# Patient Record
Sex: Female | Born: 1950 | Race: White | Hispanic: No | Smoking: Former smoker
Health system: Southern US, Community
[De-identification: ages and names within clinical notes are randomized; demographics above are authoritative.]

## PROBLEM LIST (undated history)

## (undated) DIAGNOSIS — E119 Type 2 diabetes mellitus without complications: Secondary | ICD-10-CM

## (undated) DIAGNOSIS — T8859XA Other complications of anesthesia, initial encounter: Secondary | ICD-10-CM

## (undated) DIAGNOSIS — R112 Nausea with vomiting, unspecified: Secondary | ICD-10-CM

## (undated) DIAGNOSIS — M8580 Other specified disorders of bone density and structure, unspecified site: Secondary | ICD-10-CM

## (undated) DIAGNOSIS — G43909 Migraine, unspecified, not intractable, without status migrainosus: Secondary | ICD-10-CM

## (undated) DIAGNOSIS — R1319 Other dysphagia: Secondary | ICD-10-CM

## (undated) DIAGNOSIS — I10 Essential (primary) hypertension: Secondary | ICD-10-CM

## (undated) DIAGNOSIS — M1990 Unspecified osteoarthritis, unspecified site: Secondary | ICD-10-CM

## (undated) DIAGNOSIS — I5032 Chronic diastolic (congestive) heart failure: Secondary | ICD-10-CM

## (undated) DIAGNOSIS — F419 Anxiety disorder, unspecified: Secondary | ICD-10-CM

## (undated) DIAGNOSIS — R42 Dizziness and giddiness: Secondary | ICD-10-CM

## (undated) DIAGNOSIS — K219 Gastro-esophageal reflux disease without esophagitis: Secondary | ICD-10-CM

## (undated) DIAGNOSIS — E559 Vitamin D deficiency, unspecified: Secondary | ICD-10-CM

## (undated) DIAGNOSIS — B019 Varicella without complication: Secondary | ICD-10-CM

## (undated) DIAGNOSIS — T4145XA Adverse effect of unspecified anesthetic, initial encounter: Secondary | ICD-10-CM

## (undated) DIAGNOSIS — N189 Chronic kidney disease, unspecified: Secondary | ICD-10-CM

## (undated) DIAGNOSIS — Z8719 Personal history of other diseases of the digestive system: Secondary | ICD-10-CM

## (undated) DIAGNOSIS — Z87442 Personal history of urinary calculi: Secondary | ICD-10-CM

## (undated) DIAGNOSIS — Z9889 Other specified postprocedural states: Secondary | ICD-10-CM

## (undated) DIAGNOSIS — Z148 Genetic carrier of other disease: Secondary | ICD-10-CM

## (undated) DIAGNOSIS — F5104 Psychophysiologic insomnia: Secondary | ICD-10-CM

## (undated) DIAGNOSIS — N200 Calculus of kidney: Secondary | ICD-10-CM

## (undated) DIAGNOSIS — E7800 Pure hypercholesterolemia, unspecified: Secondary | ICD-10-CM

## (undated) DIAGNOSIS — F32A Depression, unspecified: Secondary | ICD-10-CM

## (undated) HISTORY — PX: OTHER SURGICAL HISTORY: SHX169

## (undated) HISTORY — PX: CHOLECYSTECTOMY: SHX55

## (undated) HISTORY — PX: BREAST CYST ASPIRATION: SHX578

## (undated) HISTORY — PX: COLONOSCOPY, ESOPHAGOGASTRODUODENOSCOPY (EGD) AND ESOPHAGEAL DILATION: SHX5781

## (undated) HISTORY — PX: COLONOSCOPY: SHX174

## (undated) HISTORY — PX: HERNIA REPAIR: SHX51

## (undated) HISTORY — PX: LITHOTRIPSY: SUR834

## (undated) HISTORY — PX: ESOPHAGOGASTRODUODENOSCOPY: SHX1529

---

## 2004-03-02 ENCOUNTER — Ambulatory Visit: Payer: Self-pay | Admitting: Internal Medicine

## 2005-04-29 ENCOUNTER — Ambulatory Visit: Payer: Self-pay | Admitting: Unknown Physician Specialty

## 2005-05-11 ENCOUNTER — Ambulatory Visit: Payer: Self-pay | Admitting: Gastroenterology

## 2006-06-01 ENCOUNTER — Ambulatory Visit: Payer: Self-pay | Admitting: Unknown Physician Specialty

## 2008-04-17 ENCOUNTER — Ambulatory Visit: Payer: Self-pay | Admitting: Unknown Physician Specialty

## 2008-10-13 ENCOUNTER — Emergency Department: Payer: Self-pay | Admitting: Emergency Medicine

## 2009-04-23 ENCOUNTER — Ambulatory Visit: Payer: Self-pay | Admitting: Unknown Physician Specialty

## 2009-04-25 ENCOUNTER — Ambulatory Visit: Payer: Self-pay | Admitting: Unknown Physician Specialty

## 2010-04-30 ENCOUNTER — Ambulatory Visit: Payer: Self-pay | Admitting: Internal Medicine

## 2010-05-05 ENCOUNTER — Ambulatory Visit: Payer: Self-pay | Admitting: Unknown Physician Specialty

## 2010-05-06 ENCOUNTER — Ambulatory Visit: Payer: Self-pay | Admitting: Internal Medicine

## 2010-05-07 ENCOUNTER — Ambulatory Visit: Payer: Self-pay | Admitting: Unknown Physician Specialty

## 2010-05-21 ENCOUNTER — Ambulatory Visit: Payer: Self-pay | Admitting: Surgery

## 2010-06-04 ENCOUNTER — Ambulatory Visit: Payer: Self-pay | Admitting: Internal Medicine

## 2010-07-15 ENCOUNTER — Ambulatory Visit: Payer: Self-pay | Admitting: Unknown Physician Specialty

## 2010-07-17 LAB — PATHOLOGY REPORT

## 2010-08-06 ENCOUNTER — Ambulatory Visit: Payer: Self-pay | Admitting: Internal Medicine

## 2010-09-04 ENCOUNTER — Ambulatory Visit: Payer: Self-pay | Admitting: Internal Medicine

## 2010-10-29 ENCOUNTER — Ambulatory Visit: Payer: Self-pay | Admitting: Internal Medicine

## 2010-11-04 ENCOUNTER — Ambulatory Visit: Payer: Self-pay | Admitting: Internal Medicine

## 2011-03-11 ENCOUNTER — Ambulatory Visit: Payer: Self-pay | Admitting: Unknown Physician Specialty

## 2011-03-12 LAB — PATHOLOGY REPORT

## 2011-09-21 ENCOUNTER — Ambulatory Visit: Payer: Self-pay | Admitting: Obstetrics and Gynecology

## 2011-10-28 ENCOUNTER — Ambulatory Visit: Payer: Self-pay | Admitting: Urology

## 2011-11-15 ENCOUNTER — Ambulatory Visit: Payer: Self-pay | Admitting: Urology

## 2011-12-22 ENCOUNTER — Ambulatory Visit: Payer: Self-pay | Admitting: Rheumatology

## 2012-03-09 ENCOUNTER — Ambulatory Visit: Payer: Self-pay | Admitting: Urology

## 2012-03-09 DIAGNOSIS — N201 Calculus of ureter: Secondary | ICD-10-CM | POA: Insufficient documentation

## 2012-03-09 DIAGNOSIS — N1330 Unspecified hydronephrosis: Secondary | ICD-10-CM | POA: Insufficient documentation

## 2012-03-09 DIAGNOSIS — N200 Calculus of kidney: Secondary | ICD-10-CM | POA: Insufficient documentation

## 2012-03-09 DIAGNOSIS — N3946 Mixed incontinence: Secondary | ICD-10-CM | POA: Insufficient documentation

## 2012-03-09 DIAGNOSIS — R310 Gross hematuria: Secondary | ICD-10-CM | POA: Insufficient documentation

## 2012-03-13 ENCOUNTER — Ambulatory Visit: Payer: Self-pay | Admitting: Urology

## 2012-03-21 ENCOUNTER — Ambulatory Visit: Payer: Self-pay | Admitting: Urology

## 2012-08-03 ENCOUNTER — Ambulatory Visit: Payer: Self-pay | Admitting: Urology

## 2012-11-15 ENCOUNTER — Ambulatory Visit: Payer: Self-pay | Admitting: Urology

## 2012-11-15 DIAGNOSIS — N23 Unspecified renal colic: Secondary | ICD-10-CM | POA: Insufficient documentation

## 2012-11-16 ENCOUNTER — Ambulatory Visit: Payer: Self-pay | Admitting: Urology

## 2012-11-16 LAB — CBC WITH DIFFERENTIAL/PLATELET
Basophil %: 0.4 %
Eosinophil #: 0.8 10*3/uL — ABNORMAL HIGH (ref 0.0–0.7)
Lymphocyte #: 2.4 10*3/uL (ref 1.0–3.6)
Lymphocyte %: 27.5 %
MCH: 31.4 pg (ref 26.0–34.0)
MCHC: 34.9 g/dL (ref 32.0–36.0)
MCV: 90 fL (ref 80–100)
Monocyte %: 7.1 %
Neutrophil %: 56.4 %
Platelet: 266 10*3/uL (ref 150–440)
RBC: 4.46 10*6/uL (ref 3.80–5.20)
WBC: 8.9 10*3/uL (ref 3.6–11.0)

## 2012-11-16 LAB — BASIC METABOLIC PANEL
BUN: 12 mg/dL (ref 7–18)
Calcium, Total: 9.2 mg/dL (ref 8.5–10.1)
Chloride: 107 mmol/L (ref 98–107)
Creatinine: 0.73 mg/dL (ref 0.60–1.30)
EGFR (Non-African Amer.): >60
Glucose: 136 mg/dL — ABNORMAL HIGH (ref 65–99)
Osmolality: 279 (ref 275–301)
Potassium: 3.8 mmol/L (ref 3.5–5.1)

## 2012-11-16 LAB — HEPATIC FUNCTION PANEL A (ARMC)
Bilirubin, Direct: <0.1 mg/dL (ref 0.00–0.20)
SGPT (ALT): 24 U/L (ref 12–78)

## 2012-11-16 LAB — IRON: Iron: 80 ug/dL (ref 50–170)

## 2012-11-21 ENCOUNTER — Ambulatory Visit: Payer: Self-pay | Admitting: Urology

## 2012-12-07 DIAGNOSIS — T190XXA Foreign body in urethra, initial encounter: Secondary | ICD-10-CM | POA: Insufficient documentation

## 2013-01-29 ENCOUNTER — Ambulatory Visit: Payer: Self-pay | Admitting: Unknown Physician Specialty

## 2013-07-05 ENCOUNTER — Ambulatory Visit: Payer: Self-pay | Admitting: Urology

## 2013-07-05 DIAGNOSIS — M549 Dorsalgia, unspecified: Secondary | ICD-10-CM | POA: Insufficient documentation

## 2014-01-22 DIAGNOSIS — E7800 Pure hypercholesterolemia, unspecified: Secondary | ICD-10-CM | POA: Insufficient documentation

## 2014-01-22 DIAGNOSIS — E119 Type 2 diabetes mellitus without complications: Secondary | ICD-10-CM | POA: Insufficient documentation

## 2014-01-22 DIAGNOSIS — I10 Essential (primary) hypertension: Secondary | ICD-10-CM | POA: Insufficient documentation

## 2014-02-19 ENCOUNTER — Ambulatory Visit: Payer: Self-pay | Admitting: Family Medicine

## 2014-02-19 DIAGNOSIS — F419 Anxiety disorder, unspecified: Secondary | ICD-10-CM | POA: Insufficient documentation

## 2014-03-04 ENCOUNTER — Ambulatory Visit: Payer: Self-pay | Admitting: Family Medicine

## 2014-04-24 ENCOUNTER — Ambulatory Visit: Payer: Self-pay | Admitting: Orthopedic Surgery

## 2014-07-23 NOTE — Op Note (Signed)
PATIENT NAME:  Sheryl [name], KISSNER MR#:  [id number] DATE OF BIRTH:  [birth year]  DATE OF PROCEDURE:  03/21/2012  PRINCIPAL DIAGNOSIS: Left ureterolithiasis.   POSTOPERATIVE DIAGNOSIS: Left ureterolithiasis.   PROCEDURE PERFORMED:  Left ureteroscopy with holmium laser lithotripsy, left double-J ureteral stent placement.   SURGEON: Edrick Oh, M.D.   ANESTHESIA: Laryngeal mask airway anesthesia.   INDICATIONS: The patient is a 64 year old white female with a history of nephrolithiasis. She presented recently with left-sided flank pain. A CT scan demonstrated a 9 mm distal left ureteral calculus with hydronephrosis and obstruction. She has had intermittent pain and discomfort since that time. She presents for ureteroscopic stone removal.   DESCRIPTION OF PROCEDURE: After informed consent was obtained, the patient was taken to the Operating Room and placed in the dorsal lithotomy position under laryngeal mask airway anesthesia. The patient was then prepped and draped in the usual standard fashion.   The 22-French rigid cystoscope was introduced into the urethra under direct vision with no urethral abnormalities noted. Upon entering the bladder, the mucosa was inspected in its entirety with no gross mucosal lesions noted. Bilateral ureteral orifices were well visualized with no lesions noted. A guidewire was then advanced into the left ureteral orifice. It was advanced into the upper pole collecting system under fluoroscopic guidance without difficulty. No resistance was met at the level of the stone. The cystoscope was removed with the guidewire left in place. The 6-French rigid ureteroscope was advanced into the urinary bladder. The left ureteral orifice was noted to be somewhat small. A second guidewire was utilized to help navigate the scope into the left ureteral orifice. Some narrowing was encountered. This was gently dilated utilizing the tip of the scope just past the area of narrowing and the area of  dilation was entered. The 9 mm stone was well visualized in this dilated area. Moderate inflammation was present at the site of stone impaction. The holmium laser fiber was then utilized to fragment the stone into multiple smaller stone pieces. These were then basket extracted. These were collected and will be sent to pathology for stone analysis. The scope was then advanced to the level of the renal pelvis with no additional stones or abnormalities within the ureter noted. The ureter was moderately dilated throughout. No other significant abnormalities were appreciated.   Due to the need for dilation of the distal ureter and the inflammation, the decision was made to place a stent. The ureteroscope was removed with the guidewire left in place. The guidewire was back-loaded over the cystoscope. A 6-French x 24 cm double-J ureteral stent was advanced over the guidewire into the upper pole collecting system under fluoroscopic guidance without difficulty. Upon withdrawal of the guidewire, adequate curl was noted within the renal pelvis. Adequate curl was also noted within the urinary bladder.   The bladder was drained. The cystoscope was removed. The patient was returned to the supine position and awakened from laryngeal mask airway anesthesia. She was taken to the recovery room in stable condition. There were no problems or complications. The patient tolerated the procedure well.    ____________________________ Denice Bors. Jacqlyn Larsen, MD bsc:cs D: 03/21/2012 09:35:07 ET T: 03/21/2012 20:30:00 ET JOB#: [id number]  cc: Denice Bors. Jacqlyn Larsen, MD, <Dictator> Denice Bors Trev Boley MD ELECTRONICALLY SIGNED 03/22/2012 22:45

## 2014-07-26 NOTE — Op Note (Signed)
PATIENT NAME:  Sheryl [name], Sheryl [name] MR#:  [id number] DATE OF BIRTH:  April 03, 1951  DATE OF PROCEDURE:  11/21/2012  PRINCIPAL DIAGNOSIS:  Left ureterolithiasis.   POSTOPERATIVE DIAGNOSIS:  Left ureterolithiasis.   PROCEDURE PERFORMED:  Left ureteroscopy with holmium laser lithotripsy, left double-J ureteral stent placement.   SURGEON:  Edrick Oh, M.D.   ANESTHESIA:  General endotracheal.   INDICATIONS: The patient is a 64 year old white female with a history of nephrolithiasis. She underwent ureteroscopic stone removal for several large ureteral calculi in the recent past. She also had a large renal calculus. She underwent ESWL. She had multiple stone fragments remaining within the kidney. She recently developed onset left-sided flank pain. X-rays demonstrated multiple stones in the lower left ureter with resolution of the stones within the kidney consistent with stone passage into the lower ureter. She presents for ureteroscopic stone removal.   DESCRIPTION OF PROCEDURE:  After informed consent was obtained, the patient was taken to the operating room and placed in the dorsal lithotomy position under general endotracheal anesthesia. The patient was then prepped and draped in the usual standard fashion. The 22-French rigid cystoscope was introduced into the urethra under direct vision with no urethral abnormalities noted. Upon entering the bladder, the mucosa was inspected in its entirety with no gross mucosal lesions noted. Bilateral ureteral orifices were well visualized with no lesions noted. A flexible tip Glidewire was introduced into the left ureteral orifice. It was easily advanced into the upper pole collecting system under fluoroscopic guidance without difficulty. The cystoscope was removed. The 6-French rigid ureteroscope was advanced into the bladder. A second guidewire was utilized to help facilitate the passage of the scope into the ureter. Slight resistance was met in the lower ureter; however,  with manipulation, the scope was able to be passed. On initial passage, no stones were identified. The proximal ureter was moderately dilated with no evidence of stone or other significant abnormality. The scope was advanced to the level of the ureteropelvic junction with no stones noted within the renal pelvis or upper pole collecting system. The ureter was reinspected upon withdrawal of the scope in the area of the tight band previously noted. Multiple stone fragments were encountered in a more dilated portion of the ureter just behind the area of narrowing. Approximately 1.5 cm of stone fragments were present. The holmium laser fiber was then utilized to fragment the stones into multiple smaller pieces. The pieces were then basket extracted. The stone pieces were collected and will be sent for stone analysis. Due to the tightened area of the ureter and the inflammation from stone presence, the decision was made to proceed with stent placement. The ureteroscope was removed. The cystoscope was replaced and back loaded over the guidewire. A 6-French x 24 cm double-J ureteral stent was advanced over the guidewire into the upper pole collecting system both under fluoroscopic and visual guidance without difficulty. Adequate curl was noted within the renal pelvis upon withdrawal of the guidewire. Adequate curl was also noted within the urinary bladder. The bladder was drained. The cystoscope was removed. The patient was returned to the supine position and awakened from general endotracheal anesthesia. She was taken to the recovery room in stable condition. There were no problems or complications. The patient tolerated the procedure well.   ____________________________ Denice Bors. Jacqlyn Larsen, MD bsc:ce D: 11/21/2012 09:42:16 ET T: 11/21/2012 13:38:43 ET JOB#: [id number]  cc: Denice Bors. Jacqlyn Larsen, MD, <Dictator> Denice Bors Faun Mcqueen MD ELECTRONICALLY SIGNED 11/21/2012 21:31

## 2014-08-20 ENCOUNTER — Other Ambulatory Visit: Payer: Self-pay | Admitting: Family Medicine

## 2014-08-20 DIAGNOSIS — N6320 Unspecified lump in the left breast, unspecified quadrant: Secondary | ICD-10-CM

## 2014-08-20 DIAGNOSIS — F5104 Psychophysiologic insomnia: Secondary | ICD-10-CM | POA: Insufficient documentation

## 2014-09-04 ENCOUNTER — Ambulatory Visit
Admission: RE | Admit: 2014-09-04 | Discharge: 2014-09-04 | Disposition: A | Discharge status: Home / Self Care | Payer: BC Managed Care – PPO | Source: Ambulatory Visit | Attending: Family Medicine | Admitting: Family Medicine

## 2014-09-04 DIAGNOSIS — N63 Unspecified lump in breast: Secondary | ICD-10-CM | POA: Diagnosis not present

## 2014-09-04 DIAGNOSIS — N6320 Unspecified lump in the left breast, unspecified quadrant: Secondary | ICD-10-CM

## 2015-01-30 ENCOUNTER — Encounter: Payer: Self-pay | Admitting: *Deleted

## 2015-01-31 ENCOUNTER — Encounter
Admission: RE | Disposition: A | Discharge status: Home / Self Care | Payer: Self-pay | Source: Ambulatory Visit | Attending: Unknown Physician Specialty

## 2015-01-31 ENCOUNTER — Ambulatory Visit: Payer: BC Managed Care – PPO | Admitting: Anesthesiology

## 2015-01-31 ENCOUNTER — Ambulatory Visit
Admission: RE | Admit: 2015-01-31 | Discharge: 2015-01-31 | Disposition: A | Discharge status: Home / Self Care | Payer: BC Managed Care – PPO | Source: Ambulatory Visit | Attending: Unknown Physician Specialty | Admitting: Unknown Physician Specialty

## 2015-01-31 ENCOUNTER — Encounter: Payer: Self-pay | Admitting: *Deleted

## 2015-01-31 DIAGNOSIS — E1122 Type 2 diabetes mellitus with diabetic chronic kidney disease: Secondary | ICD-10-CM | POA: Diagnosis not present

## 2015-01-31 DIAGNOSIS — E7800 Pure hypercholesterolemia, unspecified: Secondary | ICD-10-CM | POA: Diagnosis not present

## 2015-01-31 DIAGNOSIS — I129 Hypertensive chronic kidney disease with stage 1 through stage 4 chronic kidney disease, or unspecified chronic kidney disease: Secondary | ICD-10-CM | POA: Diagnosis not present

## 2015-01-31 DIAGNOSIS — Z7984 Long term (current) use of oral hypoglycemic drugs: Secondary | ICD-10-CM | POA: Insufficient documentation

## 2015-01-31 DIAGNOSIS — Z79899 Other long term (current) drug therapy: Secondary | ICD-10-CM | POA: Diagnosis not present

## 2015-01-31 DIAGNOSIS — R1310 Dysphagia, unspecified: Secondary | ICD-10-CM | POA: Diagnosis present

## 2015-01-31 DIAGNOSIS — K449 Diaphragmatic hernia without obstruction or gangrene: Secondary | ICD-10-CM | POA: Diagnosis not present

## 2015-01-31 DIAGNOSIS — N189 Chronic kidney disease, unspecified: Secondary | ICD-10-CM | POA: Diagnosis not present

## 2015-01-31 DIAGNOSIS — K2950 Unspecified chronic gastritis without bleeding: Secondary | ICD-10-CM | POA: Insufficient documentation

## 2015-01-31 DIAGNOSIS — Z87891 Personal history of nicotine dependence: Secondary | ICD-10-CM | POA: Insufficient documentation

## 2015-01-31 DIAGNOSIS — K222 Esophageal obstruction: Secondary | ICD-10-CM | POA: Insufficient documentation

## 2015-01-31 DIAGNOSIS — E039 Hypothyroidism, unspecified: Secondary | ICD-10-CM | POA: Insufficient documentation

## 2015-01-31 HISTORY — DX: Type 2 diabetes mellitus without complications: E11.9

## 2015-01-31 HISTORY — DX: Chronic kidney disease, unspecified: N18.9

## 2015-01-31 HISTORY — DX: Varicella without complication: B01.9

## 2015-01-31 HISTORY — PX: SAVORY DILATION: SHX5439

## 2015-01-31 HISTORY — DX: Pure hypercholesterolemia, unspecified: E78.00

## 2015-01-31 HISTORY — DX: Essential (primary) hypertension: I10

## 2015-01-31 HISTORY — DX: Migraine, unspecified, not intractable, without status migrainosus: G43.909

## 2015-01-31 HISTORY — PX: ESOPHAGOGASTRODUODENOSCOPY (EGD) WITH PROPOFOL: SHX5813

## 2015-01-31 HISTORY — DX: Calculus of kidney: N20.0

## 2015-01-31 LAB — GLUCOSE, CAPILLARY: Glucose-Capillary: 103 mg/dL — ABNORMAL HIGH (ref 65–99)

## 2015-01-31 SURGERY — ESOPHAGOGASTRODUODENOSCOPY (EGD) WITH PROPOFOL
Anesthesia: Monitor Anesthesia Care

## 2015-01-31 MED ORDER — MIDAZOLAM HCL 5 MG/5ML IJ SOLN
INTRAMUSCULAR | Status: DC | PRN
  Administered 2015-01-31: 1 mg via INTRAVENOUS

## 2015-01-31 MED ORDER — LIDOCAINE HCL (CARDIAC) 20 MG/ML IV SOLN
INTRAVENOUS | Status: DC | PRN
  Administered 2015-01-31: 30 mg via INTRAVENOUS

## 2015-01-31 MED ORDER — PROPOFOL 500 MG/50ML IV EMUL
INTRAVENOUS | Status: DC | PRN
  Administered 2015-01-31: 160 ug/kg/min via INTRAVENOUS

## 2015-01-31 MED ORDER — SODIUM CHLORIDE 0.9 % IV SOLN: INTRAVENOUS | Status: DC

## 2015-01-31 MED ORDER — PROPOFOL 10 MG/ML IV BOLUS
INTRAVENOUS | Status: DC | PRN
  Administered 2015-01-31: 50 mg via INTRAVENOUS

## 2015-01-31 MED ORDER — SODIUM CHLORIDE 0.9 % IV SOLN
INTRAVENOUS | Status: DC
  Administered 2015-01-31 (×2): via INTRAVENOUS

## 2015-01-31 MED ORDER — FENTANYL CITRATE (PF) 100 MCG/2ML IJ SOLN
INTRAMUSCULAR | Status: DC | PRN
  Administered 2015-01-31: 50 ug via INTRAVENOUS

## 2015-01-31 NOTE — Anesthesia Postprocedure Evaluation (Signed)
  Anesthesia Post-op Note  Patient: Sheryl CowmanDonna F Rueter  Procedure(s) Performed: Procedure(s): ESOPHAGOGASTRODUODENOSCOPY (EGD) WITH PROPOFOL (N/A) SAVORY DILATION (N/A)  Anesthesia type:MAC  Patient location: PACU  Post pain: Pain level controlled  Post assessment: Post-op Vital signs reviewed, Patient's Cardiovascular Status Stable, Respiratory Function Stable, Patent Airway and No signs of Nausea or vomiting  Post vital signs: Reviewed and stable  Last Vitals:  Filed Vitals:   01/31/15 1030  BP: 129/74  Pulse: 55  Temp:   Resp: 14    Level of consciousness: awake, alert  and patient cooperative  Complications: No apparent anesthesia complications

## 2015-01-31 NOTE — Transfer of Care (Signed)
Immediate Anesthesia Transfer of Care Note  Patient: Sheryl CowmanDonna F Tovey  Procedure(s) Performed: Procedure(s): ESOPHAGOGASTRODUODENOSCOPY (EGD) WITH PROPOFOL (N/A) SAVORY DILATION (N/A)  Patient Location: PACU and Short Stay  Anesthesia Type:General  Level of Consciousness: awake and patient cooperative  Airway & Oxygen Therapy: Patient Spontanous Breathing and Patient connected to nasal cannula oxygen  Post-op Assessment: Report given to RN and Post -op Vital signs reviewed and stable  Post vital signs: Reviewed and stable  Last Vitals:  Filed Vitals:   01/31/15 0917  BP: 139/65  Pulse: 60  Temp: 36.6 C  Resp: 18    Complications: No apparent anesthesia complications

## 2015-01-31 NOTE — Op Note (Signed)
[facility] Patient Name: Sheryl [name] Procedure Date: 01/31/2015 9:40 AM MRN: [id number] Account #: [id number] Date of Birth: [birth year] Admit Type: Outpatient Age: 64 Room: [facility] ENDO ROOM 1 Gender: Female Note Status: Finalized Procedure:         Upper GI endoscopy Indications:       Dysphagia Providers:         Scot Jun, MD Referring MD:      Haynes Kerns (Referring MD) Medicines:         Propofol per Anesthesia Complications:     No immediate complications. Procedure:         Pre-Anesthesia Assessment:                    - After reviewing the risks and benefits, the patient was                     deemed in satisfactory condition to undergo the procedure.                    After obtaining informed consent, the endoscope was passed                     under direct vision. Throughout the procedure, the                     patient's blood pressure, pulse, and oxygen saturations                     were monitored continuously. The Endoscope was introduced                     through the mouth, and advanced to the second part of                     duodenum. The upper GI endoscopy was accomplished without                     difficulty. The patient tolerated the procedure well. Findings:      A moderate Schatzki ring (acquired) was found at the gastroesophageal       junction. At the end of the procedure A guidewire was placed and the       scope was withdrawn. Dilation was performed with a Savary dilator with       mild resistance at 17 mm.      A medium-sized hiatus hernia was present.      Diffuse and patchy mild inflammation characterized by erythema and       granularity was found in the gastric antrum. Biopsies were taken with a       cold forceps for Helicobacter pylori testing. Biopsies were taken with a       cold forceps for histology.      Diffuse granular mucosa was found in the second part of the duodenum.   Biopsies were taken with a cold forceps for histology. Biopsies for       histology were taken with a cold forceps for for evaluation of celiac       disease. Impression:        - Moderate Schatzki ring. Dilated.                    - Medium-sized hiatus hernia.                    -  Gastritis. Biopsied.                    - Granular mucosa at 2nd part of the duodenum. Biopsied. Recommendation:    - Await pathology results. Scot Junobert T Brissia Delisa, MD 01/31/2015 10:00:04 AM This report has been signed electronically. Number of Addenda: 0 Note Initiated On: 01/31/2015 9:40 AM Total Procedure Duration: 0 hours 5 minutes 32 seconds       [facility]

## 2015-01-31 NOTE — Anesthesia Preprocedure Evaluation (Signed)
Anesthesia Evaluation  Patient identified by MRN, date of birth, ID band Patient awake    Airway Mallampati: II  TM Distance: >3 FB     Dental no notable dental hx.    Pulmonary neg pulmonary ROS, former smoker,    Pulmonary exam normal        Cardiovascular hypertension,  Rhythm:Regular Rate:Normal     Neuro/Psych  Headaches, negative neurological ROS     GI/Hepatic   Endo/Other  diabetesHypothyroidism   Renal/GU Renal disease     Musculoskeletal   Abdominal Normal abdominal exam  (+)   Peds  Hematology   Anesthesia Other Findings   Reproductive/Obstetrics                             Anesthesia Physical Anesthesia Plan  ASA: II  Anesthesia Plan: MAC   Post-op Pain Management:    Induction:   Airway Management Planned: Nasal Cannula  Additional Equipment:   Intra-op Plan:   Post-operative Plan:   Informed Consent: I have reviewed the patients History and Physical, chart, labs and discussed the procedure including the risks, benefits and alternatives for the proposed anesthesia with the patient or authorized representative who has indicated his/her understanding and acceptance.     Plan Discussed with: CRNA  Anesthesia Plan Comments:         Anesthesia Quick Evaluation

## 2015-01-31 NOTE — H&P (Signed)
   Primary Care Physician:  Sula RumpleVirk, Charanjit, MD Primary Gastroenterologist:  Dr. Mechele CollinElliott  Pre-Procedure History & Physical: HPI:  Sheryl [name] is a 64 y.o. female is here for an endoscopy.   Past Medical History  Diagnosis Date  . Hypothyroidism   . Chronic kidney disease   . Hypercholesterolemia   . Hypertension   . Nephrolithiasis   . Migraines   . Diabetes mellitus without complication (HCC)   . Chicken pox     Past Surgical History  Procedure Laterality Date  . Breast cyst aspiration Left     negative 2012  . Cholecystectomy    . Colonoscopy    . Esophogastroduodeoscopy    . Esophogastroduodenoscopy    . Colonoscopy, esophagogastroduodenoscopy (egd) and esophageal dilation      Prior to Admission medications   Medication Sig Start Date End Date Taking? Authorizing Provider  citalopram (CELEXA) 20 MG tablet Take 20 mg by mouth daily.   Yes Historical Provider, MD  fluticasone (FLONASE) 50 MCG/ACT nasal spray Place 2 sprays into both nostrils daily.   Yes Historical Provider, MD  lisinopril (PRINIVIL,ZESTRIL) 5 MG tablet Take 5 mg by mouth daily.   Yes Historical Provider, MD  lovastatin (MEVACOR) 20 MG tablet Take 20 mg by mouth at bedtime.   Yes Historical Provider, MD  metFORMIN (GLUCOPHAGE) 500 MG tablet Take by mouth 2 (two) times daily with a meal.   Yes Historical Provider, MD  ranitidine (ZANTAC) 150 MG tablet Take 150 mg by mouth 2 (two) times daily.   Yes Historical Provider, MD  zolpidem (AMBIEN) 10 MG tablet Take 10 mg by mouth at bedtime as needed for sleep.   Yes Historical Provider, MD    Allergies as of 01/21/2015  . (Not on File)    History reviewed. No pertinent family history.  Social History   Social History  . Marital Status: Married    Spouse Name: N/A  . Number of Children: N/A  . Years of Education: N/A   Occupational History  . Not on file.   Social History Main Topics  . Smoking status: Former Games developermoker  . Smokeless tobacco: Never  Used  . Alcohol Use: No  . Drug Use: No  . Sexual Activity: Not on file   Other Topics Concern  . Not on file   Social History Narrative    Review of Systems: See HPI, otherwise negative ROS  Physical Exam: BP 139/65 mmHg  Pulse 60  Temp(Src) 97.8 F (36.6 C) (Tympanic)  Resp 18  Ht 5\' 4"  (1.626 m)  Wt 75.297 kg (166 lb)  BMI 28.48 kg/m2  SpO2 99% General:   Alert,  pleasant and cooperative in NAD Head:  Normocephalic and atraumatic. Neck:  Supple; no masses or thyromegaly. Lungs:  Clear throughout to auscultation.    Heart:  Regular rate and rhythm. Abdomen:  Soft, nontender and nondistended. Normal bowel sounds, without guarding, and without rebound.   Neurologic:  Alert and  oriented x4;  grossly normal neurologically.  Impression/Plan: Sheryl [name] is here for an endoscopy to be performed for dysphagia and previous stricture  Risks, benefits, limitations, and alternatives regarding  endoscopy have been reviewed with the patient.  Questions have been answered.  All parties agreeable.   Lynnae PrudeELLIOTT, Naydeen Speirs, MD  01/31/2015, 9:43 AM

## 2015-02-02 ENCOUNTER — Encounter: Payer: Self-pay | Admitting: Unknown Physician Specialty

## 2015-02-03 LAB — SURGICAL PATHOLOGY

## 2016-08-31 DIAGNOSIS — R809 Proteinuria, unspecified: Secondary | ICD-10-CM | POA: Insufficient documentation

## 2016-10-04 ENCOUNTER — Other Ambulatory Visit: Payer: Self-pay | Admitting: Orthopedic Surgery

## 2016-10-04 DIAGNOSIS — M75101 Unspecified rotator cuff tear or rupture of right shoulder, not specified as traumatic: Secondary | ICD-10-CM

## 2016-10-04 DIAGNOSIS — M7531 Calcific tendinitis of right shoulder: Secondary | ICD-10-CM

## 2016-10-13 ENCOUNTER — Ambulatory Visit
Admission: RE | Admit: 2016-10-13 | Discharge: 2016-10-13 | Disposition: A | Discharge status: Home / Self Care | Payer: BC Managed Care – PPO | Source: Ambulatory Visit | Attending: Orthopedic Surgery | Admitting: Orthopedic Surgery

## 2016-10-13 DIAGNOSIS — M7531 Calcific tendinitis of right shoulder: Secondary | ICD-10-CM

## 2016-10-13 DIAGNOSIS — M75101 Unspecified rotator cuff tear or rupture of right shoulder, not specified as traumatic: Secondary | ICD-10-CM

## 2016-10-13 DIAGNOSIS — M75111 Incomplete rotator cuff tear or rupture of right shoulder, not specified as traumatic: Secondary | ICD-10-CM | POA: Diagnosis not present

## 2017-01-12 ENCOUNTER — Encounter: Payer: Self-pay | Admitting: *Deleted

## 2017-01-19 ENCOUNTER — Ambulatory Visit: Payer: BC Managed Care – PPO | Admitting: Student in an Organized Health Care Education/Training Program

## 2017-01-19 ENCOUNTER — Encounter
Admission: RE | Disposition: A | Discharge status: Home / Self Care | Payer: Self-pay | Source: Ambulatory Visit | Attending: Surgery

## 2017-01-19 ENCOUNTER — Ambulatory Visit
Admission: RE | Admit: 2017-01-19 | Discharge: 2017-01-19 | Disposition: A | Discharge status: Home / Self Care | Payer: BC Managed Care – PPO | Source: Ambulatory Visit | Attending: Surgery | Admitting: Surgery

## 2017-01-19 DIAGNOSIS — K219 Gastro-esophageal reflux disease without esophagitis: Secondary | ICD-10-CM | POA: Diagnosis not present

## 2017-01-19 DIAGNOSIS — Z833 Family history of diabetes mellitus: Secondary | ICD-10-CM | POA: Insufficient documentation

## 2017-01-19 DIAGNOSIS — K449 Diaphragmatic hernia without obstruction or gangrene: Secondary | ICD-10-CM | POA: Diagnosis not present

## 2017-01-19 DIAGNOSIS — I10 Essential (primary) hypertension: Secondary | ICD-10-CM | POA: Insufficient documentation

## 2017-01-19 DIAGNOSIS — M67813 Other specified disorders of tendon, right shoulder: Secondary | ICD-10-CM | POA: Insufficient documentation

## 2017-01-19 DIAGNOSIS — Z79899 Other long term (current) drug therapy: Secondary | ICD-10-CM | POA: Diagnosis not present

## 2017-01-19 DIAGNOSIS — Z820 Family history of epilepsy and other diseases of the nervous system: Secondary | ICD-10-CM | POA: Insufficient documentation

## 2017-01-19 DIAGNOSIS — G43909 Migraine, unspecified, not intractable, without status migrainosus: Secondary | ICD-10-CM | POA: Insufficient documentation

## 2017-01-19 DIAGNOSIS — E119 Type 2 diabetes mellitus without complications: Secondary | ICD-10-CM | POA: Insufficient documentation

## 2017-01-19 DIAGNOSIS — Z87891 Personal history of nicotine dependence: Secondary | ICD-10-CM | POA: Insufficient documentation

## 2017-01-19 DIAGNOSIS — M7541 Impingement syndrome of right shoulder: Secondary | ICD-10-CM | POA: Diagnosis present

## 2017-01-19 DIAGNOSIS — Z7984 Long term (current) use of oral hypoglycemic drugs: Secondary | ICD-10-CM | POA: Diagnosis not present

## 2017-01-19 DIAGNOSIS — Z87442 Personal history of urinary calculi: Secondary | ICD-10-CM | POA: Insufficient documentation

## 2017-01-19 DIAGNOSIS — Z8249 Family history of ischemic heart disease and other diseases of the circulatory system: Secondary | ICD-10-CM | POA: Insufficient documentation

## 2017-01-19 DIAGNOSIS — Z9049 Acquired absence of other specified parts of digestive tract: Secondary | ICD-10-CM | POA: Diagnosis not present

## 2017-01-19 DIAGNOSIS — E7800 Pure hypercholesterolemia, unspecified: Secondary | ICD-10-CM | POA: Diagnosis not present

## 2017-01-19 DIAGNOSIS — M75111 Incomplete rotator cuff tear or rupture of right shoulder, not specified as traumatic: Secondary | ICD-10-CM | POA: Diagnosis not present

## 2017-01-19 HISTORY — PX: DISTAL BICEPS TENDON REPAIR: SHX1461

## 2017-01-19 HISTORY — DX: Gastro-esophageal reflux disease without esophagitis: K21.9

## 2017-01-19 HISTORY — DX: Unspecified osteoarthritis, unspecified site: M19.90

## 2017-01-19 HISTORY — DX: Personal history of other diseases of the digestive system: Z87.19

## 2017-01-19 HISTORY — PX: SHOULDER ARTHROSCOPY: SHX128

## 2017-01-19 LAB — GLUCOSE, CAPILLARY
GLUCOSE-CAPILLARY: 118 mg/dL — AB (ref 65–99)
GLUCOSE-CAPILLARY: 161 mg/dL — AB (ref 65–99)

## 2017-01-19 SURGERY — ARTHROSCOPY, SHOULDER
Anesthesia: General | Laterality: Right | Wound class: Clean

## 2017-01-19 MED ORDER — LIDOCAINE HCL (CARDIAC) 20 MG/ML IV SOLN
INTRAVENOUS | Status: DC | PRN
  Administered 2017-01-19: 50 mg via INTRATRACHEAL

## 2017-01-19 MED ORDER — PROPOFOL 10 MG/ML IV BOLUS
INTRAVENOUS | Status: DC | PRN
  Administered 2017-01-19: 200 mg via INTRAVENOUS

## 2017-01-19 MED ORDER — ROPIVACAINE HCL 5 MG/ML IJ SOLN
INTRAMUSCULAR | Status: DC | PRN
  Administered 2017-01-19: 30 mL via EPIDURAL

## 2017-01-19 MED ORDER — FENTANYL CITRATE (PF) 100 MCG/2ML IJ SOLN: 25.0000 ug | INTRAMUSCULAR | Status: DC | PRN

## 2017-01-19 MED ORDER — LACTATED RINGERS IV SOLN
10.0000 mL/h | INTRAVENOUS | Status: DC
  Administered 2017-01-19: 10 mL/h via INTRAVENOUS

## 2017-01-19 MED ORDER — SODIUM CHLORIDE 0.9 % IV SOLN
2000.0000 mg | Freq: Once | INTRAVENOUS | Status: AC
  Administered 2017-01-19: 2000 mg via INTRAVENOUS

## 2017-01-19 MED ORDER — OXYCODONE HCL 5 MG PO TABS: 5.0000 mg | ORAL_TABLET | ORAL | 0 refills | Status: DC | PRN

## 2017-01-19 MED ORDER — POTASSIUM CHLORIDE IN NACL 20-0.9 MEQ/L-% IV SOLN: INTRAVENOUS | Status: DC

## 2017-01-19 MED ORDER — MEPERIDINE HCL 25 MG/ML IJ SOLN: 6.2500 mg | INTRAMUSCULAR | Status: DC | PRN

## 2017-01-19 MED ORDER — LIDOCAINE-EPINEPHRINE 1 %-1:100000 IJ SOLN
INTRAMUSCULAR | Status: DC | PRN
  Administered 2017-01-19: 15 mL

## 2017-01-19 MED ORDER — OXYCODONE HCL 5 MG PO TABS: 5.0000 mg | ORAL_TABLET | Freq: Once | ORAL | Status: DC | PRN

## 2017-01-19 MED ORDER — METOCLOPRAMIDE HCL 5 MG PO TABS: 5.0000 mg | ORAL_TABLET | Freq: Three times a day (TID) | ORAL | Status: DC | PRN

## 2017-01-19 MED ORDER — PROMETHAZINE HCL 25 MG/ML IJ SOLN: 6.2500 mg | INTRAMUSCULAR | Status: DC | PRN

## 2017-01-19 MED ORDER — FENTANYL CITRATE (PF) 100 MCG/2ML IJ SOLN
INTRAMUSCULAR | Status: DC | PRN
  Administered 2017-01-19: 100 ug via INTRAVENOUS

## 2017-01-19 MED ORDER — ONDANSETRON HCL 4 MG PO TABS: 4.0000 mg | ORAL_TABLET | Freq: Four times a day (QID) | ORAL | Status: DC | PRN

## 2017-01-19 MED ORDER — DEXAMETHASONE SODIUM PHOSPHATE 4 MG/ML IJ SOLN
INTRAMUSCULAR | Status: DC | PRN
  Administered 2017-01-19: 8 mg via INTRAVENOUS

## 2017-01-19 MED ORDER — METOCLOPRAMIDE HCL 5 MG/ML IJ SOLN: 5.0000 mg | Freq: Three times a day (TID) | INTRAMUSCULAR | Status: DC | PRN

## 2017-01-19 MED ORDER — OXYCODONE HCL 5 MG PO TABS: 5.0000 mg | ORAL_TABLET | ORAL | Status: DC | PRN

## 2017-01-19 MED ORDER — ONDANSETRON HCL 4 MG/2ML IJ SOLN: 4.0000 mg | Freq: Four times a day (QID) | INTRAMUSCULAR | Status: DC | PRN

## 2017-01-19 MED ORDER — ONDANSETRON HCL 4 MG/2ML IJ SOLN
INTRAMUSCULAR | Status: DC | PRN
  Administered 2017-01-19: 4 mg via INTRAVENOUS

## 2017-01-19 MED ORDER — OXYCODONE HCL 5 MG/5ML PO SOLN: 5.0000 mg | Freq: Once | ORAL | Status: DC | PRN

## 2017-01-19 MED ORDER — MIDAZOLAM HCL 2 MG/2ML IJ SOLN
INTRAMUSCULAR | Status: DC | PRN
  Administered 2017-01-19: 2 mg via INTRAVENOUS

## 2017-01-19 SURGICAL SUPPLY — 63 items
ANCHOR JUGGERKNOT WTAP NDL 2.9 (Anchor) ×3 IMPLANT
BANDAGE ACE 4X5 VEL STRL LF (GAUZE/BANDAGES/DRESSINGS) IMPLANT
BIT DRILL JUGRKNT W/NDL BIT2.9 (DRILL) ×1 IMPLANT
BLADE FULL RADIUS 3.5 (BLADE) ×3 IMPLANT
BNDG COHESIVE 4X5 TAN STRL (GAUZE/BANDAGES/DRESSINGS) ×3 IMPLANT
BNDG ESMARK 4X12 TAN STRL LF (GAUZE/BANDAGES/DRESSINGS) IMPLANT
BUR ACROMIONIZER 4.0 (BURR) ×3 IMPLANT
CANISTER SUCT 1200ML W/VALVE (MISCELLANEOUS) ×3 IMPLANT
CANNULA SHAVER 8MMX76MM (CANNULA) ×3 IMPLANT
CHLORAPREP W/TINT 26ML (MISCELLANEOUS) ×6 IMPLANT
CLOSURE WOUND 1/2 X4 (GAUZE/BANDAGES/DRESSINGS) ×1
COVER LIGHT HANDLE UNIVERSAL (MISCELLANEOUS) ×6 IMPLANT
COVER MAYO STAND STRL (DRAPES) ×3 IMPLANT
CUFF TOURN SGL QUICK 18 (TOURNIQUET CUFF) IMPLANT
CUFF TOURN SGL QUICK 24 (TOURNIQUET CUFF)
CUFF TRNQT CYL 24X4X40X1 (TOURNIQUET CUFF) IMPLANT
DRAPE FLUOR MINI C-ARM 54X84 (DRAPES) IMPLANT
DRAPE IMP U-DRAPE 54X76 (DRAPES) ×6 IMPLANT
DRAPE U-SHAPE 48X52 POLY STRL (PACKS) IMPLANT
DRILL JUGGERKNOT W/NDL BIT 2.9 (DRILL) ×3
ELECT REM PT RETURN 9FT ADLT (ELECTROSURGICAL)
ELECTRODE REM PT RTRN 9FT ADLT (ELECTROSURGICAL) IMPLANT
GAUZE PETRO XEROFOAM 1X8 (MISCELLANEOUS) ×3 IMPLANT
GAUZE SPONGE 4X4 12PLY STRL (GAUZE/BANDAGES/DRESSINGS) ×3 IMPLANT
GLOVE BIO SURGEON STRL SZ8 (GLOVE) ×6 IMPLANT
GLOVE INDICATOR 8.0 STRL GRN (GLOVE) ×3 IMPLANT
GOWN STRL REUS W/ TWL LRG LVL3 (GOWN DISPOSABLE) ×1 IMPLANT
GOWN STRL REUS W/ TWL XL LVL3 (GOWN DISPOSABLE) ×1 IMPLANT
GOWN STRL REUS W/TWL LRG LVL3 (GOWN DISPOSABLE) ×2
GOWN STRL REUS W/TWL XL LVL3 (GOWN DISPOSABLE) ×2
IV LACTATED RINGER IRRG 3000ML (IV SOLUTION) ×4
IV LR IRRIG 3000ML ARTHROMATIC (IV SOLUTION) ×2 IMPLANT
KIT ROOM TURNOVER OR (KITS) ×3 IMPLANT
MANIFOLD 4PT FOR NEPTUNE1 (MISCELLANEOUS) ×3 IMPLANT
MAT BLUE FLOOR 46X72 FLO (MISCELLANEOUS) ×3 IMPLANT
NEEDLE HYPO 21X1.5 SAFETY (NEEDLE) ×3 IMPLANT
NEEDLE REVERSE CUT 1/2 CRC (NEEDLE) ×3 IMPLANT
NS IRRIG 500ML POUR BTL (IV SOLUTION) IMPLANT
PACK ARTHROSCOPY SHOULDER (MISCELLANEOUS) ×3 IMPLANT
PACK EXTREMITY ARMC (MISCELLANEOUS) IMPLANT
PAD CAST CTTN 4X4 STRL (SOFTGOODS) IMPLANT
PAD GROUND ADULT SPLIT (MISCELLANEOUS) ×3 IMPLANT
PADDING CAST COTTON 4X4 STRL (SOFTGOODS)
SLING ARM LRG DEEP (SOFTGOODS) IMPLANT
SLING ARM M TX990204 (SOFTGOODS) IMPLANT
SLING ARM S TX990203 (SOFTGOODS) IMPLANT
SPLINT CAST 1 STEP 4X30 (MISCELLANEOUS) IMPLANT
STAPLER SKIN PROX 35W (STAPLE) ×3 IMPLANT
STRAP BODY AND KNEE 60X3 (MISCELLANEOUS) ×3 IMPLANT
STRIP CLOSURE SKIN 1/2X4 (GAUZE/BANDAGES/DRESSINGS) ×2 IMPLANT
SUT ETHIBOND 0 MO6 C/R (SUTURE) ×3 IMPLANT
SUT VIC AB 2-0 CT1 27 (SUTURE) ×2
SUT VIC AB 2-0 CT1 TAPERPNT 27 (SUTURE) ×1 IMPLANT
SUT VIC AB 2-0 SH 27 (SUTURE)
SUT VIC AB 2-0 SH 27XBRD (SUTURE) IMPLANT
SUT VIC AB 3-0 SH 27 (SUTURE)
SUT VIC AB 3-0 SH 27X BRD (SUTURE) IMPLANT
SWABSTK COMLB BENZOIN TINCTURE (MISCELLANEOUS) IMPLANT
TAPE MICROFOAM 4IN (TAPE) ×3 IMPLANT
TUBING ARTHRO INFLOW-ONLY STRL (TUBING) ×3 IMPLANT
TUBING CONNECTING 10 (TUBING) ×2 IMPLANT
TUBING CONNECTING 10' (TUBING) ×1
WAND HAND CNTRL MULTIVAC 90 (MISCELLANEOUS) ×3 IMPLANT

## 2017-01-19 NOTE — Op Note (Signed)
01/19/2017  3:54 PM  Patient:   Sheryl [name]  Pre-Op Diagnosis:   Impingement/tendinopathy with possible bursal surface partial thickness rotator cuff tear, right shoulder.  Post-Op Diagnosis: Impingement/tendinopathy with bursal surface partial thicknessrotator cuff tear and biceps tendinopathy, right shoulder.  Procedure: Limited arthroscopic debridement, arthroscopic subacromial decompression, mini-open rotator cuff repair, and mini-open biceps tenodesis, right shoulder.  Anesthesia: General endotracheal with interscalene block placed preoperatively by the anesthesiologist.  Surgeon:   Maryagnes Amos, MD  Assistant:   none  Findings: As above. There was mild fraying of the superior portion of the labrum without frank detachment from the glenoid.The biceps tendon demonstrated moderate inflammatory changes without partial or full-thickness tearing. The articular surface of the rotator cuff demonstrated mild degenerative changes at the superior-most insertional fibers of the subscapularis tendon, but otherwise was in excellent condition. The articular surfaces of both the glenoid and humerus were in satisfactory condition.  Complications: None  Fluids:   400 cc  Estimated blood loss: 10 cc  Tourniquet time: None  Drains: None  Closure: Staples   Brief clinical note: The patient is a 66 year old female with a long history of right shoulder pain. The patient's symptoms have progressed despite medications, activity modification, etc. The patient's history and examination are consistent with impingement/tendinopathy with a possiblerotator cuff tear. These findings were confirmed by MRI scan. The patient presents at this time for definitive management of these shoulder symptoms.  Procedure: The patient underwent placement of an interscalene block by the anesthesiologist in the preoperative holding area before being brought into the operating room and lain in  the supine position. The patient then underwent general endotracheal intubation and anesthesia before being repositioned in the beach chair position using the beach chair positioner. The right shoulder and upper extremity were prepped with ChloraPrep solution before being draped sterilely. Preoperative antibiotics were administered. A timeout was performed to confirm the proper surgical site before the expected portal sites and incision site were injected with 0.5% Sensorcaine with epinephrine. A posterior portal was created and the glenohumeral joint thoroughly inspected with the findings as described above. An anterior portal was created using an outside-in technique. The labrum and rotator cuff were further probed, again confirming the above-noted findings. The areas of labral fraying were debrided back to stable margins using the full-radius resector as were several areas of synovitis.Probing of the biceps tendon demonstrated the area of inflammation. Therefore, given these findings, in conjunction with the fraying of the superior-most insertional fibers of the subscapularis tendon and the superior labrum, it was elected to proceed with a biceps tenodesis. The ArthroCare wand was inserted and used to release the biceps tendon from its labral insertion. It also was used to obtain hemostasis as well as to "anneal" the labrum superiorly and anteriorly. The instruments were removed from the joint after suctioning the excess fluid.  The camera was repositioned through the posterior portal into the subacromial space. A separate lateral portal was created using an outside-in technique. The 3.5 mm full-radius resector was introduced and used to perform a subtotal bursectomy. The ArthroCare wand was then inserted and used to remove the periosteal tissue off the undersurface of the anterior third of the acromion as well as to recess the coracoacromial ligament from its attachment along the anterior and lateral margins  of the acromion. The 4.0 mm acromionizing bur was introduced and used to complete the decompression by removing the undersurface of the anterior third of the acromion. The full radius resector was reintroduced  to remove any residual bony debris before the ArthroCare wand was reintroduced to obtain hemostasis. The instruments were then removed from the subacromial space after suctioning the excess fluid.  An approximately 4-5 cm incision was made over the anterolateral aspect of the shoulder beginning at the anterolateral corner of the acromion and extending distally in line with the bicipital groove. This incision was carried down through the subcutaneous tissues to expose the deltoid fascia. The raphae between the anterior and middle thirds was identified and this plane developed to provide access into the subacromial space. Additional bursal tissues were debrided sharply using Metzenbaum scissors. The rotator cuff was inspected carefully and the partial-thickness bursal surface tear was readily identified. It measured 1 cm in length and involved approximately 20% of the thickness of the tendon. The tear was repaired in a side-to-side fashion using a #0 Ethibond interrupted suture. An apparent watertight closure was obtained.  The bicipital groove was identified by palpation and opened for 1-1.5 cm. The biceps tendon stump was retrieved through this defect. The floor of the bicipital groove was roughened with a curet before a single Biomet 2.9 mm JuggerKnot anchor was inserted. Both sets of sutures were passed through the biceps tendon and tied securely to effect the tenodesis. The bicipital sheath was reapproximated using two #0 Ethibond interrupted sutures, incorporating the biceps tendon to further reinforce the tenodesis.  The wound was copiously irrigated with sterile saline solution before the deltoid raphae was reapproximated using 2-0 Vicryl interrupted sutures. The subcutaneous tissues were closed in  two layers using 2-0 Vicryl interrupted sutures before the skin was closed using staples. The portal sites also were closed using staples. A sterile bulky dressing was applied to the shoulder before the arm was placed into a shoulder immobilizer. The patient was then awakened, extubated, and returned to the recovery room in satisfactory condition after tolerating the procedure well.

## 2017-01-19 NOTE — H&P (Signed)
Paper H&P to be scanned into permanent record. H&P reviewed and patient re-examined. No changes. 

## 2017-01-19 NOTE — Anesthesia Preprocedure Evaluation (Addendum)
Anesthesia Evaluation  Patient identified by MRN, date of birth, ID band Patient awake    Reviewed: Allergy & Precautions, H&P , NPO status , Patient's Chart, lab work & pertinent test results, reviewed documented beta blocker date and time   Airway Mallampati: II  TM Distance: >3 FB Neck ROM: full    Dental no notable dental hx.    Pulmonary neg pulmonary ROS, former smoker,    Pulmonary exam normal breath sounds clear to auscultation       Cardiovascular Exercise Tolerance: Good hypertension, negative cardio ROS   Rhythm:regular Rate:Normal     Neuro/Psych  Headaches, negative psych ROS   GI/Hepatic Neg liver ROS, hiatal hernia, GERD  ,  Endo/Other  negative endocrine ROSdiabetesHypothyroidism   Renal/GU CRFRenal disease  negative genitourinary   Musculoskeletal   Abdominal   Peds  Hematology negative hematology ROS (+)   Anesthesia Other Findings   Reproductive/Obstetrics negative OB ROS                             Anesthesia Physical Anesthesia Plan  ASA: II  Anesthesia Plan: General LMA   Post-op Pain Management: GA combined w/ Regional for post-op pain   Induction:   PONV Risk Score and Plan:   Airway Management Planned:   Additional Equipment:   Intra-op Plan:   Post-operative Plan:   Informed Consent: I have reviewed the patients History and Physical, chart, labs and discussed the procedure including the risks, benefits and alternatives for the proposed anesthesia with the patient or authorized representative who has indicated his/her understanding and acceptance.   Dental Advisory Given  Plan Discussed with: CRNA  Anesthesia Plan Comments:         Anesthesia Quick Evaluation

## 2017-01-19 NOTE — Anesthesia Procedure Notes (Signed)
Procedure Name: LMA Insertion Date/Time: 01/19/2017 2:29 PM Performed by: Andee PolesBUSH, Nandana Krolikowski Pre-anesthesia Checklist: Patient identified, Emergency Drugs available, Suction available, Timeout performed and Patient being monitored Patient Re-evaluated:Patient Re-evaluated prior to induction Oxygen Delivery Method: Circle system utilized Preoxygenation: Pre-oxygenation with 100% oxygen Induction Type: IV induction LMA: LMA inserted LMA Size: 4.0 Number of attempts: 1 Placement Confirmation: positive ETCO2 and breath sounds checked- equal and bilateral Tube secured with: Tape

## 2017-01-19 NOTE — Anesthesia Postprocedure Evaluation (Signed)
Anesthesia Post Note  Patient: Isaias CowmanDonna F Rafferty  Procedure(s) Performed: ARTHROSCOPY SHOULDER DEBRIDEMENT DECOMPRESSION AND  ROTATOR CUFF REPAIR AND BICEPS TENODESIS (Right ) BICEPS TENODESIS (Right )  Patient location during evaluation: PACU Anesthesia Type: General Level of consciousness: awake and alert Pain management: pain level controlled Vital Signs Assessment: post-procedure vital signs reviewed and stable Respiratory status: spontaneous breathing, nonlabored ventilation, respiratory function stable and patient connected to nasal cannula oxygen Cardiovascular status: blood pressure returned to baseline and stable Postop Assessment: no apparent nausea or vomiting Anesthetic complications: no    SCOURAS, NICOLE ELAINE

## 2017-01-19 NOTE — Anesthesia Procedure Notes (Signed)
Anesthesia Regional Block: Interscalene brachial plexus block   Pre-Anesthetic Checklist: ,, timeout performed, Correct Patient, Correct Site, Correct Laterality, Correct Procedure, Correct Position, site marked, Risks and benefits discussed,  Surgical consent,  Pre-op evaluation,  At surgeon's request and post-op pain management  Laterality: Right  Prep: chloraprep       Needles:  Injection technique: Single-shot  Needle Type: Stimulator Needle - 40     Needle Length: 4cm  Needle Gauge: 21     Additional Needles:   Procedures:,,,, ultrasound used (permanent image in chart),,,,  Narrative:  Start time: 01/19/2017 12:50 PM End time: 01/19/2017 12:55 PM Injection made incrementally with aspirations every 5 mL.  Performed by: Personally  Anesthesiologist: Aldine ContesSCOURAS, NICOLE ELAINE  Additional Notes: Pt tolerated the procedure well.

## 2017-01-19 NOTE — Transfer of Care (Signed)
Immediate Anesthesia Transfer of Care Note  Patient: Sheryl [name]  Procedure(s) Performed: ARTHROSCOPY SHOULDER DEBRIDEMENT DECOMPRESSION AND  ROTATOR CUFF REPAIR AND BICEPS TENODESIS (Right ) BICEPS TENODESIS (Right )  Patient Location: PACU  Anesthesia Type: General LMA  Level of Consciousness: awake, alert  and patient cooperative  Airway and Oxygen Therapy: Patient Spontanous Breathing and Patient connected to supplemental oxygen  Post-op Assessment: Post-op Vital signs reviewed, Patient's Cardiovascular Status Stable, Respiratory Function Stable, Patent Airway and No signs of Nausea or vomiting  Post-op Vital Signs: Reviewed and stable  Complications: No apparent anesthesia complications

## 2017-01-19 NOTE — Progress Notes (Signed)
Assisted Dr. Gardiner FantiScouras with right, ultrasound guided, supraclavicular block. Side rails up, monitors on throughout procedure. See vital signs in flow sheet. Tolerated Procedure well.

## 2017-01-19 NOTE — Discharge Instructions (Signed)
General Anesthesia, Adult, Care After °These instructions provide you with information about caring for yourself after your procedure. Your health care provider may also give you more specific instructions. Your treatment has been planned according to current medical practices, but problems sometimes occur. Call your health care provider if you have any problems or questions after your procedure. °What can I expect after the procedure? °After the procedure, it is common to have: °· Vomiting. °· A sore throat. °· Mental slowness. ° °It is common to feel: °· Nauseous. °· Cold or shivery. °· Sleepy. °· Tired. °· Sore or achy, even in parts of your body where you did not have surgery. ° °Follow these instructions at home: °For at least 24 hours after the procedure: °· Do not: °? Participate in activities where you could fall or become injured. °? Drive. °? Use heavy machinery. °? Drink alcohol. °? Take sleeping pills or medicines that cause drowsiness. °? Make important decisions or sign legal documents. °? Take care of children on your own. °· Rest. °Eating and drinking °· If you vomit, drink water, juice, or soup when you can drink without vomiting. °· Drink enough fluid to keep your urine clear or pale yellow. °· Make sure you have little or no nausea before eating solid foods. °· Follow the diet recommended by your health care provider. °General instructions °· Have a responsible adult stay with you until you are awake and alert. °· Return to your normal activities as told by your health care provider. Ask your health care provider what activities are safe for you. °· Take over-the-counter and prescription medicines only as told by your health care provider. °· If you smoke, do not smoke without supervision. °· Keep all follow-up visits as told by your health care provider. This is important. °Contact a health care provider if: °· You continue to have nausea or vomiting at home, and medicines are not helpful. °· You  cannot drink fluids or start eating again. °· You cannot urinate after 8-12 hours. °· You develop a skin rash. °· You have fever. °· You have increasing redness at the site of your procedure. °Get help right away if: °· You have difficulty breathing. °· You have chest pain. °· You have unexpected bleeding. °· You feel that you are having a life-threatening or urgent problem. °This information is not intended to replace advice given to you by your health care provider. Make sure you discuss any questions you have with your health care provider. °Document Released: 06/28/2000 Document Revised: 08/25/2015 Document Reviewed: 03/06/2015 °Elsevier Interactive Patient Education © 2018 [employer] ° ° °Keep dressing dry and intact.  °May shower after dressing changed on post-op day #4 (Sunday).  °Cover staples with Band-Aids after drying off. °Apply ice frequently to shoulder. °Take ibuprofen 600-800 mg TID with meals for 7-10 days, then as necessary. °Take oxycodone as prescribed when needed.  °May supplement with ES Tylenol if necessary. °Keep shoulder immobilizer on at all times except may remove for bathing purposes. °Follow-up in 10-14 days or as scheduled. °

## 2017-01-20 ENCOUNTER — Encounter: Payer: Self-pay | Admitting: Surgery

## 2017-01-21 DIAGNOSIS — M24111 Other articular cartilage disorders, right shoulder: Secondary | ICD-10-CM | POA: Insufficient documentation

## 2017-01-21 DIAGNOSIS — M7521 Bicipital tendinitis, right shoulder: Secondary | ICD-10-CM | POA: Insufficient documentation

## 2017-12-23 ENCOUNTER — Ambulatory Visit: Payer: Self-pay | Admitting: Urology

## 2018-02-08 ENCOUNTER — Ambulatory Visit: Payer: Self-pay | Admitting: Urology

## 2018-03-07 NOTE — Progress Notes (Signed)
03/10/2018  7:32 AM   Isaias Cowman [birth year] [id number]  Referring provider: Raynelle Bring [address] [place], [place] [address]  Chief Complaint  Patient presents with   Nephrolithiasis    HPI: Sheryl [name] is a 67 yo F who presents today to transfer care here from Dr. Achilles Dunk and for the evaluation of possible nephrolithiasis. She is  accompanied with a family member. She reports of pain in the LLQ and left flank pain.  Denies precipitating, aggravating or alleviating factors.  Severity is rated mild to moderate.  Denies nausea, vomiting, fever, chills.  She  denies any urinary symptoms but notes intermittent gross hematuria.  Her pain is similar to previous stone episodes.  Her UA is negative.   PMH: Past Medical History:  Diagnosis Date   Arthritis    lower back, left hip    Chicken pox    Chronic kidney disease    Diabetes mellitus without complication (HCC)    GERD (gastroesophageal reflux disease)    History of hiatal hernia    Hypercholesterolemia    Hypertension    Hypothyroidism    Migraines    rare now   Nephrolithiasis     Surgical History: Past Surgical History:  Procedure Laterality Date   BREAST CYST ASPIRATION Left    negative 2012   CHOLECYSTECTOMY     COLONOSCOPY     COLONOSCOPY, ESOPHAGOGASTRODUODENOSCOPY (EGD) AND ESOPHAGEAL DILATION     DISTAL BICEPS TENDON REPAIR Right 01/19/2017   Procedure: BICEPS TENODESIS;  Surgeon: Christena Flake, MD;  [facility];  Service: Orthopedics;  Laterality: Right;  Diabetic - oral meds   ESOPHAGOGASTRODUODENOSCOPY (EGD) WITH PROPOFOL N/A 01/31/2015   Procedure: ESOPHAGOGASTRODUODENOSCOPY (EGD) WITH PROPOFOL;  Surgeon: Scot Jun, MD;  Location: [facility];  Service: Endoscopy;  Laterality: N/A;   esophogastroduodenoscopy     esophogastroduodeoscopy     SAVORY DILATION N/A 01/31/2015   Procedure: SAVORY DILATION;  Surgeon: Scot Jun, MD;   Location[facility] END[facility];  Service: Endoscopy;  Laterality: N/A;   SHOULDER ARTHROSCOPY Right 01/19/2017   Procedure: ARTHROSCOPY SHOULDER DEBRIDEMENT DECOMPRESSION AND  ROTATOR CUFF REPAIR AND BICEPS TENODESIS;  Surgeon: Christena Flake, MD;  Location: [facility];  Service: Orthopedics;  Laterality: Right;    Home Medications:  Allergies as of 03/08/2018   No Known Allergies     Medication List        Accurate as of 03/08/18 11:59 PM. Always use your most recent med list.          citalopram 20 MG tablet Commonly known as:  CELEXA Take 20 mg by mouth daily.   fluticasone 50 MCG/ACT nasal spray Commonly known as:  FLONASE Place 2 sprays into both nostrils daily.   lisinopril 5 MG tablet Commonly known as:  PRINIVIL,ZESTRIL Take 5 mg by mouth daily.   lovastatin 20 MG tablet Commonly known as:  MEVACOR Take 20 mg by mouth at bedtime.   metFORMIN 500 MG tablet Commonly known as:  GLUCOPHAGE Take by mouth 2 (two) times daily with a meal.   ranitidine 150 MG tablet Commonly known as:  ZANTAC Take 150 mg by mouth 2 (two) times daily.       Allergies: No Known Allergies  Family History: No family history on file.  Social History:  reports that she quit smoking about 9 years ago. She has never used smokeless tobacco. She reports that she does not drink alcohol or use drugs.  ROS:  UROLOGY Frequent Urination?: No Hard to postpone urination?: No Burning/pain with urination?: No Get up at night to urinate?: No Leakage of urine?: No Urine stream starts and stops?: No Trouble starting stream?: No Do you have to strain to urinate?: No Blood in urine?: Yes Urinary tract infection?: No Sexually transmitted disease?: No Injury to kidneys or bladder?: No Painful intercourse?: No Weak stream?: No Currently pregnant?: No Vaginal bleeding?: No Last menstrual period?: Postmenopausal  Gastrointestinal Nausea?: No Vomiting?: No Indigestion/heartburn?:  No Diarrhea?: No Constipation?: No  Constitutional Fever: No Night sweats?: No Weight loss?: No Fatigue?: No  Skin Skin rash/lesions?: No Itching?: No  Eyes Blurred vision?: No Double vision?: No  Ears/Nose/Throat Sore throat?: No Sinus problems?: No  Hematologic/Lymphatic Swollen glands?: No Easy bruising?: No  Cardiovascular Leg swelling?: No Chest pain?: No  Respiratory Cough?: No Shortness of breath?: No  Endocrine Excessive thirst?: No  Musculoskeletal Back pain?: No Joint pain?: No  Neurological Headaches?: No Dizziness?: No  Psychologic Depression?: No Anxiety?: No  Physical Exam: There were no vitals taken for this visit.  Constitutional:  Alert and oriented, No acute distress. HEENT: [place] AT, moist mucus membranes.  Trachea midline, no masses. Cardiovascular: No clubbing, cyanosis, or edema. Respiratory: Normal respiratory effort, no increased work of breathing. GI: Abdomen is soft,no abdominal masses, mild LLQ tenderness GU: Mild left CVA tenderness  Skin: No rashes, bruises or suspicious lesions. Neurologic: Grossly intact, no focal deficits, moving all 4 extremities. Psychiatric: Normal mood and affect.  Laboratory Data:  Urinalysis Dipstick 1+ leukocytes, trace blood Microscopy negative   Assessment & Plan:   1. History of Nephrolithiasis -KUB ordered and call back with report  -If any abnormal calcifications noted will need verification with either a renal ultrasound or CT.  If KUB negative would recommend CT urogram and cystoscopy as part of a hematuria evaluation.  2. Gross Hematuria -See above    Riki AltesScott C Stoioff, MD  [facility] [address], [address] [place], [place] [address] [phone]   I, Donne Hazelethusan Sivanesan, am acting as a scribe for Dr. Lorin PicketScott C. Stoioff,  I, Riki AltesScott C Stoioff, MD, have reviewed all documentation for this visit. The documentation on 12.08/2017 for the exam,  diagnosis, procedures, and orders are all accurate and complete.

## 2018-03-08 ENCOUNTER — Encounter: Payer: Self-pay | Admitting: Urology

## 2018-03-08 ENCOUNTER — Encounter

## 2018-03-08 ENCOUNTER — Ambulatory Visit
Admission: RE | Admit: 2018-03-08 | Discharge: 2018-03-08 | Disposition: A | Discharge status: Home / Self Care | Payer: Medicare Other | Source: Ambulatory Visit | Attending: Urology | Admitting: Urology

## 2018-03-08 ENCOUNTER — Ambulatory Visit (INDEPENDENT_AMBULATORY_CARE_PROVIDER_SITE_OTHER): Payer: Medicare Other | Admitting: Urology

## 2018-03-08 DIAGNOSIS — N200 Calculus of kidney: Secondary | ICD-10-CM

## 2018-03-08 DIAGNOSIS — R109 Unspecified abdominal pain: Secondary | ICD-10-CM

## 2018-03-08 LAB — MICROSCOPIC EXAMINATION

## 2018-03-08 LAB — URINALYSIS, COMPLETE
Bilirubin, UA: NEGATIVE
GLUCOSE, UA: NEGATIVE
KETONES UA: NEGATIVE
NITRITE UA: NEGATIVE
SPEC GRAV UA: 1.025 (ref 1.005–1.030)
UUROB: 1 mg/dL (ref 0.2–1.0)
pH, UA: 6.5 (ref 5.0–7.5)

## 2018-03-10 ENCOUNTER — Encounter: Payer: Self-pay | Admitting: Urology

## 2018-03-16 ENCOUNTER — Ambulatory Visit
Admission: RE | Admit: 2018-03-16 | Discharge: 2018-03-16 | Disposition: A | Discharge status: Home / Self Care | Payer: Medicare Other | Source: Ambulatory Visit | Attending: Urology | Admitting: Urology

## 2018-03-16 DIAGNOSIS — N200 Calculus of kidney: Secondary | ICD-10-CM | POA: Insufficient documentation

## 2018-03-16 DIAGNOSIS — R109 Unspecified abdominal pain: Secondary | ICD-10-CM | POA: Diagnosis present

## 2018-03-24 ENCOUNTER — Telehealth: Payer: Self-pay | Admitting: Family Medicine

## 2018-03-24 NOTE — Telephone Encounter (Signed)
Patient notified and made appointment.

## 2018-03-24 NOTE — Telephone Encounter (Signed)
-----   Message from Riki AltesScott C Stoioff, MD sent at 03/24/2018 12:23 PM EST ----- CT showed a large stone in her left kidney.  Recommend follow-up appointment after the first of the year to discuss treatment options.

## 2018-04-10 ENCOUNTER — Emergency Department
Admission: EM | Admit: 2018-04-10 | Discharge: 2018-04-10 | Disposition: A | Discharge status: Home / Self Care | Payer: Medicare Other | Attending: Student in an Organized Health Care Education/Training Program | Admitting: Student in an Organized Health Care Education/Training Program

## 2018-04-10 ENCOUNTER — Encounter: Payer: Self-pay | Admitting: Emergency Medicine

## 2018-04-10 ENCOUNTER — Emergency Department: Payer: Medicare Other

## 2018-04-10 DIAGNOSIS — I129 Hypertensive chronic kidney disease with stage 1 through stage 4 chronic kidney disease, or unspecified chronic kidney disease: Secondary | ICD-10-CM | POA: Diagnosis not present

## 2018-04-10 DIAGNOSIS — N12 Tubulo-interstitial nephritis, not specified as acute or chronic: Secondary | ICD-10-CM

## 2018-04-10 DIAGNOSIS — Z7984 Long term (current) use of oral hypoglycemic drugs: Secondary | ICD-10-CM | POA: Diagnosis not present

## 2018-04-10 DIAGNOSIS — Z9049 Acquired absence of other specified parts of digestive tract: Secondary | ICD-10-CM | POA: Diagnosis not present

## 2018-04-10 DIAGNOSIS — N10 Acute tubulo-interstitial nephritis: Secondary | ICD-10-CM | POA: Diagnosis not present

## 2018-04-10 DIAGNOSIS — N189 Chronic kidney disease, unspecified: Secondary | ICD-10-CM | POA: Insufficient documentation

## 2018-04-10 DIAGNOSIS — Z87891 Personal history of nicotine dependence: Secondary | ICD-10-CM | POA: Diagnosis not present

## 2018-04-10 DIAGNOSIS — N200 Calculus of kidney: Secondary | ICD-10-CM

## 2018-04-10 DIAGNOSIS — E039 Hypothyroidism, unspecified: Secondary | ICD-10-CM | POA: Insufficient documentation

## 2018-04-10 DIAGNOSIS — R1030 Lower abdominal pain, unspecified: Secondary | ICD-10-CM | POA: Diagnosis present

## 2018-04-10 DIAGNOSIS — E1122 Type 2 diabetes mellitus with diabetic chronic kidney disease: Secondary | ICD-10-CM | POA: Insufficient documentation

## 2018-04-10 DIAGNOSIS — Z79899 Other long term (current) drug therapy: Secondary | ICD-10-CM | POA: Diagnosis not present

## 2018-04-10 DIAGNOSIS — F419 Anxiety disorder, unspecified: Secondary | ICD-10-CM | POA: Diagnosis not present

## 2018-04-10 LAB — URINALYSIS, COMPLETE (UACMP) WITH MICROSCOPIC
BILIRUBIN URINE: NEGATIVE
Glucose, UA: NEGATIVE mg/dL
KETONES UR: NEGATIVE mg/dL
Nitrite: NEGATIVE
Protein, ur: NEGATIVE mg/dL
Specific Gravity, Urine: 1.012 (ref 1.005–1.030)
pH: 6 (ref 5.0–8.0)

## 2018-04-10 LAB — BASIC METABOLIC PANEL
ANION GAP: 8 (ref 5–15)
BUN: 10 mg/dL (ref 8–23)
CALCIUM: 9.1 mg/dL (ref 8.9–10.3)
CO2: 25 mmol/L (ref 22–32)
CREATININE: 1.08 mg/dL — AB (ref 0.44–1.00)
Chloride: 106 mmol/L (ref 98–111)
GFR, EST NON AFRICAN AMERICAN: 54 mL/min — AB (ref >60)
GLUCOSE: 124 mg/dL — AB (ref 70–99)
Potassium: 3.5 mmol/L (ref 3.5–5.1)
Sodium: 139 mmol/L (ref 135–145)

## 2018-04-10 LAB — CBC
HCT: 46.4 % — ABNORMAL HIGH (ref 36.0–46.0)
Hemoglobin: 15.2 g/dL — ABNORMAL HIGH (ref 12.0–15.0)
MCH: 30.4 pg (ref 26.0–34.0)
MCHC: 32.8 g/dL (ref 30.0–36.0)
MCV: 92.8 fL (ref 80.0–100.0)
NRBC: 0 % (ref 0.0–0.2)
PLATELETS: 296 10*3/uL (ref 150–400)
RBC: 5 MIL/uL (ref 3.87–5.11)
RDW: 12.5 % (ref 11.5–15.5)
WBC: 11.8 10*3/uL — ABNORMAL HIGH (ref 4.0–10.5)

## 2018-04-10 MED ORDER — OXYCODONE HCL 5 MG PO TABS
5.0000 mg | ORAL_TABLET | Freq: Once | ORAL | Status: AC
  Administered 2018-04-10: 5 mg via ORAL
  Filled 2018-04-10: qty 1

## 2018-04-10 MED ORDER — OXYCODONE HCL 5 MG PO TABS: 5.0000 mg | ORAL_TABLET | Freq: Three times a day (TID) | ORAL | 0 refills | Status: DC | PRN

## 2018-04-10 MED ORDER — NAPROXEN 500 MG PO TABS
500.0000 mg | ORAL_TABLET | Freq: Once | ORAL | Status: AC
  Administered 2018-04-10: 500 mg via ORAL
  Filled 2018-04-10: qty 1

## 2018-04-10 MED ORDER — LEVOFLOXACIN 750 MG PO TABS: 750.0000 mg | ORAL_TABLET | Freq: Every day | ORAL | 0 refills | Status: AC

## 2018-04-10 MED ORDER — NAPROXEN 500 MG PO TABS: 500.0000 mg | ORAL_TABLET | Freq: Two times a day (BID) | ORAL | 0 refills | Status: DC

## 2018-04-10 NOTE — ED Notes (Signed)
See triage note  Presents with left flank pain which is  Moving into groin area  States she hash a hx of renal stones and was seen by Dr Lonna Cobb about 2-3 weeks ago  Was told that she has stone to both side  States pain has increases with some nausea

## 2018-04-10 NOTE — ED Notes (Signed)
Pt verbalized understanding of d/c instructions, rx, and f/u care. No further questions at this time. Pt ambulatory with steady gait to the lobby with family.

## 2018-04-10 NOTE — ED Triage Notes (Signed)
Patient presents to the ED with intermittent flank pain for the past several months but states it was worse in the past few weeks and extremely bad today.  Patient reports history of kidney stones and states she was seen Dec. 1st by urology and told she had a kidney stone at that time.  Patient states she has had difficulty getting an appointment with urology since that time but this time the pain is worse.  Patient states, "I've never been able to pass my kidney stones before, they've always had to go in and bust them up."  Patient is in no obvious discomfort during triage.

## 2018-04-10 NOTE — Discharge Instructions (Signed)
Please follow up with Dr. Lonna Cobb as scheduled. You may want to call and request an earlier appointment.  Return to the ER for symptoms that change or worsen if unable to schedule an appointment.

## 2018-04-11 NOTE — ED Provider Notes (Signed)
[facility] Emergency Department Provider Note  ____________________________________________   First MD Initiated Contact with Patient 04/10/18 1714     (approximate)  I have reviewed the triage vital signs and the nursing notes.   HISTORY  Chief Complaint Flank Pain   HPI Sheryl [name] is a 68 y.o. female who presents to the emergency department for treatment and evaluation of flank pain. She was evaluated by urology and was told she has 2 large stones in each kidney. She has a follow up with Dr. Lonna CobbStoioff next week, but the left flank pain has become severe. No relief with tylenol or ibuprofen.    Past Medical History:  Diagnosis Date  . Arthritis    lower back, left hip   . Chicken pox   . Chronic kidney disease   . Diabetes mellitus without complication (HCC)   . GERD (gastroesophageal reflux disease)   . History of hiatal hernia   . Hypercholesterolemia   . Hypertension   . Hypothyroidism   . Migraines    rare now  . Nephrolithiasis     Patient Active Problem List   Diagnosis Date Noted  . 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 Biceps tendinitis of right upper extremity 01/21/2017  . Urine test positive for microalbuminuria 08/31/2016  . Chronic insomnia 08/20/2014  . Anxiety 02/19/2014  . Diabetes mellitus type 2, uncomplicated (HCC) 01/22/2014  . Hypercholesterolemia 01/22/2014  . Hypertension 01/22/2014  . Back pain 07/05/2013  . Foreign body in bladder and urethra 12/07/2012  . Renal colic 11/15/2012  . Disorder of calcium metabolism 03/09/2012  . Gross hematuria 03/09/2012  . Hydronephrosis 03/09/2012  . Kidney stone 03/09/2012  . Mixed urge and stress incontinence 03/09/2012  . Ureteric stone 03/09/2012    Past Surgical History:  Procedure Laterality Date  . BREAST CYST ASPIRATION Left    negative 2012  . CHOLECYSTECTOMY    . COLONOSCOPY    . COLONOSCOPY, ESOPHAGOGASTRODUODENOSCOPY (EGD)  AND ESOPHAGEAL DILATION    . DISTAL BICEPS TENDON REPAIR Right 01/19/2017   Procedure: BICEPS TENODESIS;  Surgeon: Christena FlakePoggi, John J, MD;  [facility];  Service: Orthopedics;  Laterality: Right;  Diabetic - oral meds  . ESOPHAGOGASTRODUODENOSCOPY (EGD) WITH PROPOFOL N/A 01/31/2015   Procedure: ESOPHAGOGASTRODUODENOSCOPY (EGD) WITH PROPOFOL;  Surgeon: Scot Junobert T Elliott, MD;  [facility];  Service: Endoscopy;  Laterality: N/A;  . esophogastroduodenoscopy    . esophogastroduodeoscopy    . SAVORY DILATION N/A 01/31/2015   Procedure: SAVORY DILATION;  Surgeon: Scot Junobert T Elliott, MD;  [facility];  Service: Endoscopy;  Laterality: N/A;  . SHOULDER ARTHROSCOPY Right 01/19/2017   Procedure: ARTHROSCOPY SHOULDER DEBRIDEMENT DECOMPRESSION AND  ROTATOR CUFF REPAIR AND BICEPS TENODESIS;  Surgeon: Christena FlakePoggi, John J, MD;  Location: [facility];  Service: Orthopedics;  Laterality: Right;    Prior to Admission medications   Medication Sig Start Date End Date Taking? Authorizing Provider  citalopram (CELEXA) 20 MG tablet Take 20 mg by mouth daily.    [provider]  fluticasone (FLONASE) 50 MCG/ACT nasal spray Place 2 sprays into both nostrils daily.    [provider]  levofloxacin (LEVAQUIN) 750 MG tablet Take 1 tablet (750 mg total) by mouth daily for 14 days. 04/10/18 04/24/18  Bernise Sylvain, Rulon Eisenmengerari B, FNP  lisinopril (PRINIVIL,ZESTRIL) 5 MG tablet Take 5 mg by mouth daily.    [provider]  lovastatin (MEVACOR) 20 MG tablet Take 20 mg by mouth at  bedtime.    [provider]  metFORMIN (GLUCOPHAGE) 500 MG tablet Take by mouth 2 (two) times daily with a meal.    [provider]  naproxen (NAPROSYN) 500 MG tablet Take 1 tablet (500 mg total) by mouth 2 (two) times daily with a meal. 04/10/18   Garret Teale B, FNP  oxyCODONE (ROXICODONE) 5 MG immediate release tablet Take 1 tablet (5 mg total) by mouth every 8 (eight) hours as  needed. 04/10/18 04/10/19  Bri Wakeman, Kasandra Knudsenari B, FNP  ranitidine (ZANTAC) 150 MG tablet Take 150 mg by mouth 2 (two) times daily.    [provider]    Allergies Patient has no known allergies.  No family history on file.  Social History Social History   Tobacco Use  . Smoking status: Former Smoker    Last attempt to quit: 2010    Years since quitting: 10.0  . Smokeless tobacco: Never Used  Substance Use Topics  . Alcohol use: No  . Drug use: No    Review of Systems  Constitutional: No fever/chills Eyes: No visual changes. ENT: No sore throat. Cardiovascular: Denies chest pain. Respiratory: Denies shortness of breath. Gastrointestinal: No abdominal pain.  No nausea, no vomiting.  No diarrhea.  No constipation. Genitourinary: Negative for dysuria. Musculoskeletal: Positive for back pain. Skin: Negative for rash. Neurological: Negative for headaches, focal weakness or numbness. ____________________________________________   PHYSICAL EXAM:  VITAL SIGNS: ED Triage Vitals  Enc Vitals Group     BP 04/10/18 1551 (!) 152/85     Pulse Rate 04/10/18 1551 99     Resp 04/10/18 1551 20     Temp 04/10/18 1551 97.8 F (36.6 C)     Temp Source 04/10/18 1551 Oral     SpO2 04/10/18 1551 95 %     Weight 04/10/18 1552 182 lb (82.6 kg)     Height 04/10/18 1552 5\' 4"  (1.626 m)     Head Circumference --      Peak Flow --      Pain Score 04/10/18 1551 8     Pain Loc --      Pain Edu? --      Excl. in GC? --     Constitutional: Alert and oriented. Well appearing and in no acute distress. Eyes: Conjunctivae are normal. Head: Atraumatic. Nose: No congestion/rhinnorhea. Mouth/Throat: Mucous membranes are moist.  Oropharynx non-erythematous. Neck: No stridor.   Cardiovascular: Normal rate, regular rhythm. Grossly normal heart sounds.  Good peripheral circulation. Respiratory: Normal respiratory effort.  No retractions. Lungs CTAB. Gastrointestinal: Soft and nontender. No  distention. No abdominal bruits. CVA tenderness on the left. Musculoskeletal: No lower extremity tenderness nor edema. Neurologic:  Normal speech and language. No gross focal neurologic deficits are appreciated. No gait instability. Skin:  Skin is warm, dry and intact. No rash noted. Psychiatric: Mood and affect are normal. Speech and behavior are normal.  ____________________________________________   LABS (all labs ordered are listed, but only abnormal results are displayed)  Labs Reviewed  URINALYSIS, COMPLETE (UACMP) WITH MICROSCOPIC - Abnormal; Notable for the following components:      Result Value   Color, Urine YELLOW (*)    APPearance HAZY (*)    Hgb urine dipstick MODERATE (*)    Leukocytes, UA LARGE (*)    Bacteria, UA RARE (*)    All other components within normal limits  BASIC METABOLIC PANEL - Abnormal; Notable for the following components:   Glucose, Bld 124 (*)    Creatinine, Ser  1.08 (*)    GFR calc non Af Amer 54 (*)    All other components within normal limits  CBC - Abnormal; Notable for the following components:   WBC 11.8 (*)    Hemoglobin 15.2 (*)    HCT 46.4 (*)    All other components within normal limits   ____________________________________________  EKG  Not indicated. ____________________________________________  RADIOLOGY  ED MD interpretation:  4mm calculus in the interpolar right kidney without hydronephrosis.48mm calculus neat the left lower pole with mild hydronephrosis.  Official radiology report(s): US Renal  Result Date: 04/10/2018 CLINICAL DATA:  Flank pain EXAM: RENAL / URINARY TRACT ULTRASOUND COMPLETE COMPARISON:  CT abdomen pelvis 03/16/2018 FINDINGS: Right Kidney: Renal measurements: 12.1 x 4.5 x 4.6 cm = volume: 130 mL. There is a 9 mm calculus in the interpolar right kidney. No hydronephrosis. There is a cyst near the lower pole measuring 3.2 x 3.6 x 3.2 cm. Left Kidney: Renal measurements: 11.7 x 6.3 x 6.1 cm = volume: 237 mL.  There is a shadowing calculus measuring 22 mm near the left lower pole. There is mild hydronephrosis. Bladder: Appears normal for degree of bladder distention. Ureteral jets were not visualized. IMPRESSION: Bilateral nephrolithiasis with mild left hydronephrosis. Electronically Signed   By: Deatra Robinson M.D.   On: 04/10/2018 18:25    ____________________________________________   PROCEDURES  Procedure(s) performed: None  Procedures  Critical Care performed: No  ____________________________________________   INITIAL IMPRESSION / ASSESSMENT AND PLAN / ED COURSE  As part of my medical decision making, I reviewed the following data within the electronic MEDICAL RECORD NUMBER Notes from prior ED visits   68 year old female presenting with renal colic. US shows large stones in each kidney with mild hydronephrosis on the left. Her pain is fairly well controlled at this time and she is afebrile. Based on the UA and flank pain, she will be treated for pyelonephrosis. She is to call the urologist and request an earlier appointment or at least keep the one scheduled for next week. She was advised to return to the ER if pain is not controllable with medications or if she is unable to urinate. She and her husband were agreeable to the plan.      ____________________________________________   FINAL CLINICAL IMPRESSION(S) / ED DIAGNOSES  Final diagnoses:  Nephrolithiasis  Pyelonephritis     ED Discharge Orders         Ordered    levofloxacin (LEVAQUIN) 750 MG tablet  Daily     04/10/18 1858    oxyCODONE (ROXICODONE) 5 MG immediate release tablet  Every 8 hours PRN     04/10/18 1858    naproxen (NAPROSYN) 500 MG tablet  2 times daily with meals     04/10/18 1858           Note:  This document was prepared using Dragon voice recognition software and may include unintentional dictation errors.    Chinita Pester, FNP 04/11/18 1554    Willy Eddy, MD 04/14/18 [phone]

## 2018-04-18 ENCOUNTER — Ambulatory Visit: Payer: Medicare Other | Admitting: Urology

## 2018-04-18 ENCOUNTER — Encounter: Payer: Self-pay | Admitting: Urology

## 2018-04-18 VITALS — BP 126/69 | HR 73 | Ht 64.0 in

## 2018-04-18 DIAGNOSIS — N200 Calculus of kidney: Secondary | ICD-10-CM

## 2018-04-20 ENCOUNTER — Telehealth: Payer: Self-pay | Admitting: Urology

## 2018-04-20 ENCOUNTER — Telehealth: Payer: Self-pay

## 2018-04-20 NOTE — Telephone Encounter (Signed)
Returned call to patient to find out what medication she wanted a refill on. She is requesting refill on oxycodone that was given to her in the ER.

## 2018-04-20 NOTE — Telephone Encounter (Signed)
Pt left vm and wants a refill, but didn't leave any message about which medication. Can you please call pt back at [phone] Thanks, Mindi Junker

## 2018-04-20 NOTE — Progress Notes (Signed)
04/18/2018 8:42 AM   Sheryl [name] Oct 16, 1950 [id number]  Referring provider: Marina Goodell, MD [address] [place], [place] [address]  Chief Complaint  Patient presents with  . Nephrolithiasis  . Hospitalization Follow-up    HPI: 68 year old female presents for follow-up of nephrolithiasis.  I initially saw her on 03/08/2018 for left flank and left lower quadrant abdominal pain with a history of stone disease.  A KUB showed a large calculus in the left kidney and CT was recommended which showed a left lower pole partial staghorn calculus extending into the renal pelvis.  She was seen in the ED on 04/10/2018 for increasing left flank pain.  She was treated for UTI however a urine culture was not performed.  Urinalysis did show pyuria.  She presents today for discussion of stone treatment options.   PMH: Past Medical History:  Diagnosis Date  . Arthritis    lower back, left hip   . Chicken pox   . Chronic kidney disease   . Diabetes mellitus without complication (HCC)   . GERD (gastroesophageal reflux disease)   . History of hiatal hernia   . Hypercholesterolemia   . Hypertension   . Hypothyroidism   . Migraines    rare now  . Nephrolithiasis     Surgical History: Past Surgical History:  Procedure Laterality Date  . BREAST CYST ASPIRATION Left    negative 2012  . CHOLECYSTECTOMY    . COLONOSCOPY    . COLONOSCOPY, ESOPHAGOGASTRODUODENOSCOPY (EGD) AND ESOPHAGEAL DILATION    . DISTAL BICEPS TENDON REPAIR Right 01/19/2017   Procedure: BICEPS TENODESIS;  Surgeon: Christena Flake, MD;  Location: [facility];  Service: Orthopedics;  Laterality: Right;  Diabetic - oral meds  . ESOPHAGOGASTRODUODENOSCOPY (EGD) WITH PROPOFOL N/A 01/31/2015   Procedure: ESOPHAGOGASTRODUODENOSCOPY (EGD) WITH PROPOFOL;  Surgeon: Scot Jun, MD;  Location: [facility];  Service: Endoscopy;  Laterality: N/A;  . esophogastroduodenoscopy    . esophogastroduodeoscopy    .  SAVORY DILATION N/A 01/31/2015   Procedure: SAVORY DILATION;  Surgeon: Scot Jun, MD;  Location: [facility];  Service: Endoscopy;  Laterality: N/A;  . SHOULDER ARTHROSCOPY Right 01/19/2017   Procedure: ARTHROSCOPY SHOULDER DEBRIDEMENT DECOMPRESSION AND  ROTATOR CUFF REPAIR AND BICEPS TENODESIS;  Surgeon: Christena Flake, MD;  Location: [facility];  Service: Orthopedics;  Laterality: Right;    Home Medications:  Allergies as of 04/18/2018   No Known Allergies     Medication List       Accurate as of April 18, 2018 11:59 PM. Always use your most recent med list.        citalopram 20 MG tablet Commonly known as:  CELEXA Take 20 mg by mouth daily.   fluticasone 50 MCG/ACT nasal spray Commonly known as:  FLONASE Place 2 sprays into both nostrils daily.   levofloxacin 750 MG tablet Commonly known as:  LEVAQUIN Take 1 tablet (750 mg total) by mouth daily for 14 days.   lisinopril 5 MG tablet Commonly known as:  PRINIVIL,ZESTRIL Take 5 mg by mouth daily.   lovastatin 20 MG tablet Commonly known as:  MEVACOR Take 20 mg by mouth at bedtime.   metFORMIN 500 MG tablet Commonly known as:  GLUCOPHAGE Take by mouth 2 (two) times daily with a meal.   naproxen 500 MG tablet Commonly known as:  NAPROSYN Take 1 tablet (500 mg total) by mouth 2 (two) times daily with a meal.   oxyCODONE 5 MG immediate release  tablet Commonly known as:  ROXICODONE Take 1 tablet (5 mg total) by mouth every 8 (eight) hours as needed.   ranitidine 150 MG tablet Commonly known as:  ZANTAC Take 150 mg by mouth 2 (two) times daily.       Allergies: No Known Allergies  Family History: No family history on file.  Social History:  reports that she quit smoking about 10 years ago. She has never used smokeless tobacco. She reports that she does not drink alcohol or use drugs.  ROS: UROLOGY Frequent Urination?: No Hard to postpone urination?: Yes Burning/pain with urination?:  No Get up at night to urinate?: No Leakage of urine?: Yes Urine stream starts and stops?: No Trouble starting stream?: No Do you have to strain to urinate?: No Blood in urine?: No Urinary tract infection?: No Sexually transmitted disease?: No Injury to kidneys or bladder?: No Painful intercourse?: No Weak stream?: No Currently pregnant?: No Vaginal bleeding?: No Last menstrual period?: Postmenopausal  Gastrointestinal Nausea?: No Vomiting?: No Indigestion/heartburn?: No Diarrhea?: No Constipation?: No  Constitutional Fever: No Night sweats?: No Weight loss?: No Fatigue?: No  Skin Skin rash/lesions?: No Itching?: No  Eyes Blurred vision?: No Double vision?: No  Ears/Nose/Throat Sore throat?: No Sinus problems?: No  Hematologic/Lymphatic Swollen glands?: No Easy bruising?: No  Cardiovascular Leg swelling?: No Chest pain?: No  Respiratory Cough?: No Shortness of breath?: No  Endocrine Excessive thirst?: No  Musculoskeletal Back pain?: No Joint pain?: No  Neurological Headaches?: No Dizziness?: No  Psychologic Depression?: No Anxiety?: No  Physical Exam: BP 126/69 (BP Location: Left Arm, Patient Position: Sitting, Cuff Size: Large)   Pulse 73   Ht 5\' 4"  (1.626 m)   BMI 31.24 kg/m   Constitutional:  Alert and oriented, No acute distress. HEENT: [place] AT, moist mucus membranes.  Trachea midline, no masses. Cardiovascular: No clubbing, cyanosis, or edema.  RRR Respiratory: Normal respiratory effort, no increased work of breathing.  Clear GI: Abdomen is soft, nontender, nondistended, no abdominal masses GU: No CVA tenderness Lymph: No cervical or inguinal lymphadenopathy. Skin: No rashes, bruises or suspicious lesions. Neurologic: Grossly intact, no focal deficits, moving all 4 extremities. Psychiatric: Normal mood and affect.    Pertinent Imaging: Images were personally reviewed Results for orders placed during the hospital encounter of  03/08/18  Abdomen 1 view (KUB)   Narrative CLINICAL DATA:  Acute left flank pain, gross hematuria.  EXAM: ABDOMEN - 1 VIEW  COMPARISON:  Radiographs of July 05, 2013.  FINDINGS: The bowel gas pattern is normal. Status post cholecystectomy. Left nephrolithiasis is noted with largest calculus measuring 17 mm.  IMPRESSION: Left nephrolithiasis with 17 mm calculus seen in expected position of left renal pelvis. No evidence of bowel obstruction or ileus.   Electronically Signed   By: Lupita RaiderJames  Green Jr, M.D.   On: 03/09/2018 09:59     Results for orders placed during the hospital encounter of 04/10/18  US Renal   Narrative CLINICAL DATA:  Flank pain  EXAM: RENAL / URINARY TRACT ULTRASOUND COMPLETE  COMPARISON:  CT abdomen pelvis 03/16/2018  FINDINGS: Right Kidney:  Renal measurements: 12.1 x 4.5 x 4.6 cm = volume: 130 mL. There is a 9 mm calculus in the interpolar right kidney. No hydronephrosis. There is a cyst near the lower pole measuring 3.2 x 3.6 x 3.2 cm.  Left Kidney:  Renal measurements: 11.7 x 6.3 x 6.1 cm = volume: 237 mL. There is a shadowing calculus measuring 22 mm near the left lower  pole. There is mild hydronephrosis.  Bladder:  Appears normal for degree of bladder distention. Ureteral jets were not visualized.  IMPRESSION: Bilateral nephrolithiasis with mild left hydronephrosis.   Electronically Signed   By: Deatra Robinson M.D.   On: 04/10/2018 18:25     Results for orders placed during the hospital encounter of 03/16/18  CT RENAL STONE STUDY   Narrative CLINICAL DATA:  Left-sided flank pain for 3 weeks.  Nephrolithiasis.  EXAM: CT ABDOMEN AND PELVIS WITHOUT CONTRAST  TECHNIQUE: Multidetector CT imaging of the abdomen and pelvis was performed following the standard protocol without IV contrast.  COMPARISON:  None.  FINDINGS: Lower chest: No acute findings.  Hepatobiliary: No mass visualized on this unenhanced exam. Mild diffuse  hepatic steatosis. Prior cholecystectomy. No evidence of biliary obstruction.  Pancreas: No mass or inflammatory process visualized on this unenhanced exam.  Spleen:  Within normal limits in size.  Adrenals/Urinary tract: Small fluid attenuation right renal cyst is seen. Renal calculi are seen bilaterally. Partial staghorn calculus is seen in the left renal pelvis and lower pole collecting system, with mild left hydronephrosis. No evidence of ureteral calculi or dilatation. Unremarkable unopacified urinary bladder.  Stomach/Bowel: Small hiatal hernia. No evidence of obstruction, inflammatory process, or abnormal fluid collections. Normal appendix visualized. Diverticulosis is seen mainly involving the descending and sigmoid colon, however there is no evidence of diverticulitis.  Vascular/Lymphatic: No pathologically enlarged lymph nodes identified. No evidence of abdominal aortic aneurysm. Aortic atherosclerosis.  Reproductive: Normal appearance of uterus. 1.7 cm benign-appearing right ovarian cyst noted.  Other:  None.  Musculoskeletal:  No suspicious bone lesions identified.  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  Bilateral nephrolithiasis.  Small hiatal hernia.  Hepatic steatosis.  Colonic diverticulosis, without radiographic evidence of diverticulitis.  Small benign appearing right ovarian cyst.   Electronically Signed   By: Myles Rosenthal M.D.   On: 03/16/2018 10:34     Assessment & Plan:   68 year old female with a lower pole partial staghorn calculus.  She was primarily interested in ESWL however based on stone volume I did not recommend.  PCNL and staged ureteroscopy were both discussed.  She was informed that PCNL would have the highest success rate of having the stone treated and one procedure but is more invasive and has more potential complications including bleeding/hemorrhage and renal  injury.  She would like to think over these options but is leaning towards PCNL.   Riki Altes, MD  [facility] [address] [place], [place] [address] [phone]

## 2018-04-21 MED ORDER — OXYCODONE HCL 5 MG PO TABS: 5.0000 mg | ORAL_TABLET | Freq: Three times a day (TID) | ORAL | 0 refills | Status: DC | PRN

## 2018-04-21 NOTE — Telephone Encounter (Signed)
Refill was sent

## 2018-04-23 ENCOUNTER — Encounter: Payer: Self-pay | Admitting: Urology

## 2018-05-02 ENCOUNTER — Other Ambulatory Visit: Payer: Self-pay | Admitting: Radiology

## 2018-05-02 DIAGNOSIS — N200 Calculus of kidney: Secondary | ICD-10-CM

## 2018-05-04 ENCOUNTER — Other Ambulatory Visit: Payer: Self-pay | Admitting: Radiology

## 2018-05-10 ENCOUNTER — Other Ambulatory Visit: Payer: Self-pay | Admitting: Radiology

## 2018-05-10 DIAGNOSIS — N200 Calculus of kidney: Secondary | ICD-10-CM

## 2018-05-11 ENCOUNTER — Other Ambulatory Visit: Payer: Self-pay

## 2018-05-11 ENCOUNTER — Encounter
Admission: RE | Admit: 2018-05-11 | Discharge: 2018-05-11 | Disposition: A | Discharge status: Home / Self Care | Payer: Medicare Other | Source: Ambulatory Visit | Attending: Urology | Admitting: Urology

## 2018-05-11 DIAGNOSIS — Z01818 Encounter for other preprocedural examination: Secondary | ICD-10-CM | POA: Insufficient documentation

## 2018-05-11 DIAGNOSIS — Z01810 Encounter for preprocedural cardiovascular examination: Secondary | ICD-10-CM | POA: Diagnosis not present

## 2018-05-11 HISTORY — DX: Anxiety disorder, unspecified: F41.9

## 2018-05-11 HISTORY — DX: Personal history of urinary calculi: Z87.442

## 2018-05-11 LAB — CBC
HCT: 46.8 % — ABNORMAL HIGH (ref 36.0–46.0)
Hemoglobin: 15.4 g/dL — ABNORMAL HIGH (ref 12.0–15.0)
MCH: 30.4 pg (ref 26.0–34.0)
MCHC: 32.9 g/dL (ref 30.0–36.0)
MCV: 92.3 fL (ref 80.0–100.0)
Platelets: 270 10*3/uL (ref 150–400)
RBC: 5.07 MIL/uL (ref 3.87–5.11)
RDW: 12.6 % (ref 11.5–15.5)
WBC: 7.2 10*3/uL (ref 4.0–10.5)
nRBC: 0 % (ref 0.0–0.2)

## 2018-05-11 LAB — BASIC METABOLIC PANEL
Anion gap: 7 (ref 5–15)
BUN: 17 mg/dL (ref 8–23)
CO2: 29 mmol/L (ref 22–32)
Calcium: 9.4 mg/dL (ref 8.9–10.3)
Chloride: 104 mmol/L (ref 98–111)
Creatinine, Ser: 0.61 mg/dL (ref 0.44–1.00)
GFR calc Af Amer: >60 mL/min (ref >60)
GFR calc non Af Amer: >60 mL/min (ref >60)
GLUCOSE: 103 mg/dL — AB (ref 70–99)
Potassium: 3.9 mmol/L (ref 3.5–5.1)
Sodium: 140 mmol/L (ref 135–145)

## 2018-05-11 LAB — TYPE AND SCREEN
ABO/RH(D): A NEG
Antibody Screen: NEGATIVE

## 2018-05-11 LAB — PROTIME-INR
INR: 0.95
Prothrombin Time: 12.6 seconds (ref 11.4–15.2)

## 2018-05-11 NOTE — Patient Instructions (Signed)
Your procedure is scheduled on: May 22, 2018 MONDAY Report to Day Surgery on [facility]nd [facility] To find out your arrival time, please call [phone] between 1PM - 3PM on: May 19, 2018 Friday   REMEMBER: Instructions that are not followed completely may result in serious medical risk, up to and including death; or upon the discretion of your surgeon and anesthesiologist your surgery may need to be rescheduled.  Do not eat food after midnight the night before surgery.  No gum chewing, lozengers or hard candies.  You may however, drink CLEAR liquids up to 2 hours before you are scheduled to arrive for your surgery. Do not drink anything within 2 hours of the start of your surgery.  Clear liquids include: - water   Do NOT drink anything that is not on this list.  Type 1 and Type 2 diabetics should only drink water.  No Alcohol for 24 hours before or after surgery.  No Smoking including e-cigarettes for 24 hours prior to surgery.  No chewable tobacco products for at least 6 hours prior to surgery.  No nicotine patches on the day of surgery.  On the morning of surgery brush your teeth with toothpaste and water, you may rinse your mouth with mouthwash if you wish. Do not swallow any toothpaste or mouthwash.  Notify your doctor if there is any change in your medical condition (cold, fever, infection).  Do not wear jewelry, make-up, hairpins, clips or nail polish.  Do not wear lotions, powders, or perfumes OR DEODORANT  Do not shave 48 hours prior to surgery.   Contacts and dentures may not be worn into surgery.  Do not bring valuables to the hospital, including drivers license, insurance or credit cards.  [facility] is not responsible for any belongings or valuables.   TAKE THESE MEDICATIONS THE MORNING OF SURGERY: CELEXA  DO NOT TAKE LISINOPRIL THE DAY OF SURGERY  Use CHG Soap or wipes as directed on instruction sheet.   Stop Metformin  2 days  prior to surgery. LAST DOSE ON Friday May 19, 2018.  Follow recommendations from Cardiologist, Pulmonologist or PCP regarding stopping Aspirin, Coumadin, Plavix, Eliquis, Pradaxa, or Pletal.  Stop Anti-inflammatories (NSAIDS) such as Advil, Aleve, Ibuprofen, Motrin, Naproxen, Naprosyn and Aspirin based products such as Excedrin, Goodys Powder, BC Powder. (May take Tylenol or Acetaminophen if needed.)  Stop ANY OVER THE COUNTER supplements until after surgery. (May continue Vitamin D, Vitamin B, and multivitamin.)  Wear comfortable clothing (specific to your surgery type) to the hospital.  Plan for stool softeners for home use.  If you are being admitted to the hospital overnight, leave your suitcase in the car. After surgery it may be brought to your room.  If you are being discharged the day of surgery, you will not be allowed to drive home. You will need a responsible adult to drive you home and stay with you that night.   If you are taking public transportation, you will need to have a responsible adult with you. Please confirm with your physician that it is acceptable to use public transportation.   Please call [phone] if you have any questions about these instructions.

## 2018-05-16 ENCOUNTER — Other Ambulatory Visit: Payer: Self-pay | Admitting: Radiology

## 2018-05-16 ENCOUNTER — Other Ambulatory Visit: Payer: Medicare Other

## 2018-05-16 DIAGNOSIS — N200 Calculus of kidney: Secondary | ICD-10-CM

## 2018-05-16 LAB — URINALYSIS, COMPLETE
Bilirubin, UA: NEGATIVE
Glucose, UA: NEGATIVE
Ketones, UA: NEGATIVE
Leukocytes, UA: NEGATIVE
Nitrite, UA: NEGATIVE
Protein, UA: NEGATIVE
Specific Gravity, UA: 1.02 (ref 1.005–1.030)
Urobilinogen, Ur: 0.2 mg/dL (ref 0.2–1.0)
pH, UA: 5.5 (ref 5.0–7.5)

## 2018-05-16 LAB — MICROSCOPIC EXAMINATION: WBC, UA: NONE SEEN /hpf (ref 0–5)

## 2018-05-18 LAB — CULTURE, URINE COMPREHENSIVE

## 2018-05-19 ENCOUNTER — Other Ambulatory Visit: Payer: Self-pay | Admitting: Student

## 2018-05-19 ENCOUNTER — Other Ambulatory Visit: Payer: Self-pay | Admitting: Physician Assistant

## 2018-05-21 MED ORDER — CEFAZOLIN SODIUM-DEXTROSE 2-4 GM/100ML-% IV SOLN: 2.0000 g | INTRAVENOUS | Status: DC

## 2018-05-22 ENCOUNTER — Encounter: Payer: Self-pay | Admitting: *Deleted

## 2018-05-22 ENCOUNTER — Other Ambulatory Visit: Payer: Self-pay

## 2018-05-22 ENCOUNTER — Ambulatory Visit: Payer: Medicare Other | Admitting: Anesthesiology

## 2018-05-22 ENCOUNTER — Ambulatory Visit: Payer: Medicare Other

## 2018-05-22 ENCOUNTER — Encounter
Admission: RE | Disposition: A | Discharge status: Home / Self Care | Payer: Self-pay | Source: Home / Self Care | Attending: Urology

## 2018-05-22 ENCOUNTER — Observation Stay
Admission: RE | Admit: 2018-05-22 | Discharge: 2018-05-23 | Disposition: A | Discharge status: Home / Self Care | Payer: Medicare Other | Attending: Urology | Admitting: Urology

## 2018-05-22 ENCOUNTER — Ambulatory Visit
Admission: RE | Admit: 2018-05-22 | Discharge: 2018-05-22 | Disposition: A | Discharge status: Home / Self Care | Payer: Medicare Other | Source: Ambulatory Visit | Attending: Urology | Admitting: Urology

## 2018-05-22 DIAGNOSIS — Z7984 Long term (current) use of oral hypoglycemic drugs: Secondary | ICD-10-CM | POA: Diagnosis not present

## 2018-05-22 DIAGNOSIS — K219 Gastro-esophageal reflux disease without esophagitis: Secondary | ICD-10-CM | POA: Diagnosis not present

## 2018-05-22 DIAGNOSIS — Q6211 Congenital occlusion of ureteropelvic junction: Secondary | ICD-10-CM | POA: Diagnosis not present

## 2018-05-22 DIAGNOSIS — F419 Anxiety disorder, unspecified: Secondary | ICD-10-CM | POA: Diagnosis not present

## 2018-05-22 DIAGNOSIS — N132 Hydronephrosis with renal and ureteral calculous obstruction: Principal | ICD-10-CM | POA: Insufficient documentation

## 2018-05-22 DIAGNOSIS — Z87891 Personal history of nicotine dependence: Secondary | ICD-10-CM | POA: Diagnosis not present

## 2018-05-22 DIAGNOSIS — E7800 Pure hypercholesterolemia, unspecified: Secondary | ICD-10-CM | POA: Diagnosis not present

## 2018-05-22 DIAGNOSIS — Z791 Long term (current) use of non-steroidal anti-inflammatories (NSAID): Secondary | ICD-10-CM | POA: Diagnosis not present

## 2018-05-22 DIAGNOSIS — Z79899 Other long term (current) drug therapy: Secondary | ICD-10-CM | POA: Diagnosis not present

## 2018-05-22 DIAGNOSIS — Z87442 Personal history of urinary calculi: Secondary | ICD-10-CM | POA: Diagnosis not present

## 2018-05-22 DIAGNOSIS — N200 Calculus of kidney: Secondary | ICD-10-CM

## 2018-05-22 DIAGNOSIS — I129 Hypertensive chronic kidney disease with stage 1 through stage 4 chronic kidney disease, or unspecified chronic kidney disease: Secondary | ICD-10-CM | POA: Diagnosis not present

## 2018-05-22 DIAGNOSIS — E1122 Type 2 diabetes mellitus with diabetic chronic kidney disease: Secondary | ICD-10-CM | POA: Diagnosis not present

## 2018-05-22 DIAGNOSIS — N189 Chronic kidney disease, unspecified: Secondary | ICD-10-CM | POA: Insufficient documentation

## 2018-05-22 DIAGNOSIS — Z436 Encounter for attention to other artificial openings of urinary tract: Secondary | ICD-10-CM | POA: Insufficient documentation

## 2018-05-22 DIAGNOSIS — Z419 Encounter for procedure for purposes other than remedying health state, unspecified: Secondary | ICD-10-CM

## 2018-05-22 HISTORY — PX: NEPHROLITHOTOMY: SHX5134

## 2018-05-22 HISTORY — PX: IR NEPHROSTOMY PLACEMENT LEFT: IMG6063

## 2018-05-22 LAB — GLUCOSE, CAPILLARY
Glucose-Capillary: 119 mg/dL — ABNORMAL HIGH (ref 70–99)
Glucose-Capillary: 153 mg/dL — ABNORMAL HIGH (ref 70–99)
Glucose-Capillary: 163 mg/dL — ABNORMAL HIGH (ref 70–99)

## 2018-05-22 LAB — CBC
HCT: 45.5 % (ref 36.0–46.0)
Hemoglobin: 15.2 g/dL — ABNORMAL HIGH (ref 12.0–15.0)
MCH: 30.5 pg (ref 26.0–34.0)
MCHC: 33.4 g/dL (ref 30.0–36.0)
MCV: 91.4 fL (ref 80.0–100.0)
PLATELETS: 286 10*3/uL (ref 150–400)
RBC: 4.98 MIL/uL (ref 3.87–5.11)
RDW: 12.6 % (ref 11.5–15.5)
WBC: 5.7 10*3/uL (ref 4.0–10.5)
nRBC: 0 % (ref 0.0–0.2)

## 2018-05-22 LAB — APTT: aPTT: 29 seconds (ref 24–36)

## 2018-05-22 LAB — PROTIME-INR
INR: 0.98
PROTHROMBIN TIME: 12.9 s (ref 11.4–15.2)

## 2018-05-22 LAB — ABO/RH: ABO/RH(D): A NEG

## 2018-05-22 SURGERY — NEPHROLITHOTOMY PERCUTANEOUS
Anesthesia: General | Site: Back | Laterality: Left

## 2018-05-22 MED ORDER — CEFAZOLIN SODIUM-DEXTROSE 1-4 GM/50ML-% IV SOLN
1.0000 g | Freq: Three times a day (TID) | INTRAVENOUS | Status: AC
  Administered 2018-05-22 – 2018-05-23 (×2): 1 g via INTRAVENOUS
  Filled 2018-05-22 (×3): qty 50

## 2018-05-22 MED ORDER — PHENYLEPHRINE HCL 10 MG/ML IJ SOLN
INTRAMUSCULAR | Status: DC | PRN
  Administered 2018-05-22 (×6): 100 ug via INTRAVENOUS

## 2018-05-22 MED ORDER — GLYCOPYRROLATE 0.2 MG/ML IJ SOLN
INTRAMUSCULAR | Status: DC | PRN
  Administered 2018-05-22: 0.2 mg via INTRAVENOUS

## 2018-05-22 MED ORDER — PNEUMOCOCCAL VAC POLYVALENT 25 MCG/0.5ML IJ INJ: 0.5000 mL | INJECTION | INTRAMUSCULAR | Status: DC

## 2018-05-22 MED ORDER — SODIUM CHLORIDE 0.9 % IV SOLN
INTRAVENOUS | Status: DC
  Administered 2018-05-22 (×2): via INTRAVENOUS

## 2018-05-22 MED ORDER — FENTANYL CITRATE (PF) 100 MCG/2ML IJ SOLN
INTRAMUSCULAR | Status: AC
  Filled 2018-05-22: qty 2

## 2018-05-22 MED ORDER — IOPAMIDOL (ISOVUE-300) INJECTION 61%
100.0000 mL | Freq: Once | INTRAVENOUS | Status: AC | PRN
  Administered 2018-05-22: 30 mL

## 2018-05-22 MED ORDER — IOPAMIDOL (ISOVUE-200) INJECTION 41%
INTRAVENOUS | Status: DC | PRN
  Administered 2018-05-22: 60 mL

## 2018-05-22 MED ORDER — BELLADONNA ALKALOIDS-OPIUM 16.2-60 MG RE SUPP: 1.0000 | Freq: Four times a day (QID) | RECTAL | Status: DC | PRN

## 2018-05-22 MED ORDER — PROMETHAZINE HCL 25 MG/ML IJ SOLN
6.2500 mg | INTRAMUSCULAR | Status: DC | PRN
  Administered 2018-05-22: 6.25 mg via INTRAVENOUS

## 2018-05-22 MED ORDER — ONDANSETRON HCL 4 MG/2ML IJ SOLN
INTRAMUSCULAR | Status: DC | PRN
  Administered 2018-05-22: 4 mg via INTRAVENOUS

## 2018-05-22 MED ORDER — PROMETHAZINE HCL 25 MG/ML IJ SOLN
INTRAMUSCULAR | Status: AC
  Administered 2018-05-22: 6.25 mg via INTRAVENOUS
  Filled 2018-05-22: qty 1

## 2018-05-22 MED ORDER — DOCUSATE SODIUM 100 MG PO CAPS
100.0000 mg | ORAL_CAPSULE | Freq: Two times a day (BID) | ORAL | Status: DC
  Administered 2018-05-22 – 2018-05-23 (×2): 100 mg via ORAL
  Filled 2018-05-22 (×2): qty 1

## 2018-05-22 MED ORDER — LIDOCAINE HCL (PF) 1 % IJ SOLN
INTRAMUSCULAR | Status: AC
  Filled 2018-05-22: qty 30

## 2018-05-22 MED ORDER — ACETAMINOPHEN 325 MG PO TABS: 650.0000 mg | ORAL_TABLET | ORAL | Status: DC | PRN

## 2018-05-22 MED ORDER — LIDOCAINE HCL (PF) 1 % IJ SOLN
INTRAMUSCULAR | Status: AC | PRN
  Administered 2018-05-22: 10 mL

## 2018-05-22 MED ORDER — MEPERIDINE HCL 50 MG/ML IJ SOLN: 6.2500 mg | INTRAMUSCULAR | Status: DC | PRN

## 2018-05-22 MED ORDER — DEXAMETHASONE SODIUM PHOSPHATE 10 MG/ML IJ SOLN
INTRAMUSCULAR | Status: DC | PRN
  Administered 2018-05-22: 5 mg via INTRAVENOUS

## 2018-05-22 MED ORDER — ONDANSETRON HCL 4 MG/2ML IJ SOLN: 4.0000 mg | INTRAMUSCULAR | Status: DC | PRN

## 2018-05-22 MED ORDER — ONDANSETRON HCL 4 MG/2ML IJ SOLN
INTRAMUSCULAR | Status: AC
  Administered 2018-05-22: 4 mg via INTRAVENOUS
  Filled 2018-05-22: qty 2

## 2018-05-22 MED ORDER — MORPHINE SULFATE (PF) 2 MG/ML IV SOLN: 2.0000 mg | INTRAVENOUS | Status: DC | PRN

## 2018-05-22 MED ORDER — INSULIN ASPART 100 UNIT/ML ~~LOC~~ SOLN
4.0000 [IU] | Freq: Three times a day (TID) | SUBCUTANEOUS | Status: DC
  Administered 2018-05-22 – 2018-05-23 (×3): 4 [IU] via SUBCUTANEOUS
  Filled 2018-05-22 (×3): qty 1

## 2018-05-22 MED ORDER — MIDAZOLAM HCL 2 MG/2ML IJ SOLN
INTRAMUSCULAR | Status: AC
  Filled 2018-05-22: qty 2

## 2018-05-22 MED ORDER — PRAVASTATIN SODIUM 20 MG PO TABS
40.0000 mg | ORAL_TABLET | Freq: Every day | ORAL | Status: DC
  Administered 2018-05-22: 40 mg via ORAL
  Filled 2018-05-22: qty 2

## 2018-05-22 MED ORDER — SUGAMMADEX SODIUM 200 MG/2ML IV SOLN
INTRAVENOUS | Status: DC | PRN
  Administered 2018-05-22: 200 mg via INTRAVENOUS

## 2018-05-22 MED ORDER — SUGAMMADEX SODIUM 200 MG/2ML IV SOLN
INTRAVENOUS | Status: AC
  Filled 2018-05-22: qty 2

## 2018-05-22 MED ORDER — OXYCODONE HCL 5 MG PO TABS: 5.0000 mg | ORAL_TABLET | Freq: Once | ORAL | Status: DC | PRN

## 2018-05-22 MED ORDER — CIPROFLOXACIN IN D5W 400 MG/200ML IV SOLN
INTRAVENOUS | Status: AC
  Filled 2018-05-22: qty 200

## 2018-05-22 MED ORDER — SODIUM CHLORIDE 0.9 % IV SOLN
INTRAVENOUS | Status: DC
  Administered 2018-05-22 (×2): via INTRAVENOUS

## 2018-05-22 MED ORDER — ONDANSETRON HCL 4 MG/2ML IJ SOLN
4.0000 mg | Freq: Once | INTRAMUSCULAR | Status: AC
  Administered 2018-05-22: 4 mg via INTRAVENOUS

## 2018-05-22 MED ORDER — INSULIN ASPART 100 UNIT/ML ~~LOC~~ SOLN
0.0000 [IU] | Freq: Three times a day (TID) | SUBCUTANEOUS | Status: DC
  Administered 2018-05-22: 3 [IU] via SUBCUTANEOUS
  Administered 2018-05-23 (×2): 2 [IU] via SUBCUTANEOUS
  Filled 2018-05-22 (×3): qty 1

## 2018-05-22 MED ORDER — LISINOPRIL 10 MG PO TABS
5.0000 mg | ORAL_TABLET | Freq: Every day | ORAL | Status: DC
  Administered 2018-05-22 – 2018-05-23 (×2): 5 mg via ORAL
  Filled 2018-05-22 (×2): qty 1

## 2018-05-22 MED ORDER — FENTANYL CITRATE (PF) 100 MCG/2ML IJ SOLN
INTRAMUSCULAR | Status: DC | PRN
  Administered 2018-05-22 (×3): 50 ug via INTRAVENOUS

## 2018-05-22 MED ORDER — ROCURONIUM BROMIDE 50 MG/5ML IV SOLN
INTRAVENOUS | Status: AC
  Filled 2018-05-22: qty 1

## 2018-05-22 MED ORDER — HEPARIN SODIUM (PORCINE) 5000 UNIT/ML IJ SOLN
5000.0000 [IU] | Freq: Three times a day (TID) | INTRAMUSCULAR | Status: DC
  Administered 2018-05-22 – 2018-05-23 (×3): 5000 [IU] via SUBCUTANEOUS
  Filled 2018-05-22 (×3): qty 1

## 2018-05-22 MED ORDER — OXYCODONE HCL 5 MG/5ML PO SOLN: 5.0000 mg | Freq: Once | ORAL | Status: DC | PRN

## 2018-05-22 MED ORDER — FAMOTIDINE 20 MG PO TABS
20.0000 mg | ORAL_TABLET | Freq: Once | ORAL | Status: AC
  Administered 2018-05-22: 20 mg via ORAL

## 2018-05-22 MED ORDER — FAMOTIDINE 20 MG PO TABS
20.0000 mg | ORAL_TABLET | Freq: Two times a day (BID) | ORAL | Status: DC
  Administered 2018-05-22 – 2018-05-23 (×2): 20 mg via ORAL
  Filled 2018-05-22 (×2): qty 1

## 2018-05-22 MED ORDER — FENTANYL CITRATE (PF) 100 MCG/2ML IJ SOLN
INTRAMUSCULAR | Status: AC | PRN
  Administered 2018-05-22 (×2): 50 ug via INTRAVENOUS

## 2018-05-22 MED ORDER — OXYBUTYNIN CHLORIDE 5 MG PO TABS: 5.0000 mg | ORAL_TABLET | Freq: Three times a day (TID) | ORAL | Status: DC | PRN

## 2018-05-22 MED ORDER — CITALOPRAM HYDROBROMIDE 20 MG PO TABS
20.0000 mg | ORAL_TABLET | Freq: Every day | ORAL | Status: DC
  Administered 2018-05-23: 20 mg via ORAL
  Filled 2018-05-22: qty 1

## 2018-05-22 MED ORDER — SODIUM CHLORIDE 0.9 % IV SOLN: INTRAVENOUS | Status: DC

## 2018-05-22 MED ORDER — FAMOTIDINE 20 MG PO TABS
ORAL_TABLET | ORAL | Status: AC
  Administered 2018-05-22: 20 mg via ORAL
  Filled 2018-05-22: qty 1

## 2018-05-22 MED ORDER — ONDANSETRON HCL 4 MG/2ML IJ SOLN
INTRAMUSCULAR | Status: AC
  Filled 2018-05-22: qty 2

## 2018-05-22 MED ORDER — CEFAZOLIN SODIUM-DEXTROSE 2-4 GM/100ML-% IV SOLN
2.0000 g | Freq: Once | INTRAVENOUS | Status: AC
  Administered 2018-05-22: 2 g via INTRAVENOUS
  Filled 2018-05-22: qty 100

## 2018-05-22 MED ORDER — DIPHENHYDRAMINE HCL 12.5 MG/5ML PO ELIX
12.5000 mg | ORAL_SOLUTION | Freq: Four times a day (QID) | ORAL | Status: DC | PRN
  Filled 2018-05-22: qty 5

## 2018-05-22 MED ORDER — SODIUM CHLORIDE FLUSH 0.9 % IV SOLN
INTRAVENOUS | Status: AC
  Filled 2018-05-22: qty 10

## 2018-05-22 MED ORDER — INSULIN ASPART 100 UNIT/ML ~~LOC~~ SOLN: 0.0000 [IU] | Freq: Every day | SUBCUTANEOUS | Status: DC

## 2018-05-22 MED ORDER — CEFAZOLIN SODIUM-DEXTROSE 2-4 GM/100ML-% IV SOLN
INTRAVENOUS | Status: AC
  Filled 2018-05-22: qty 100

## 2018-05-22 MED ORDER — ZOLPIDEM TARTRATE 5 MG PO TABS: 5.0000 mg | ORAL_TABLET | Freq: Every evening | ORAL | Status: DC | PRN

## 2018-05-22 MED ORDER — DEXAMETHASONE SODIUM PHOSPHATE 10 MG/ML IJ SOLN
INTRAMUSCULAR | Status: AC
  Filled 2018-05-22: qty 1

## 2018-05-22 MED ORDER — FENTANYL CITRATE (PF) 100 MCG/2ML IJ SOLN: 25.0000 ug | INTRAMUSCULAR | Status: DC | PRN

## 2018-05-22 MED ORDER — MIDAZOLAM HCL 5 MG/5ML IJ SOLN
INTRAMUSCULAR | Status: AC
  Filled 2018-05-22: qty 5

## 2018-05-22 MED ORDER — DIPHENHYDRAMINE HCL 50 MG/ML IJ SOLN: 12.5000 mg | Freq: Four times a day (QID) | INTRAMUSCULAR | Status: DC | PRN

## 2018-05-22 MED ORDER — MIDAZOLAM HCL 2 MG/2ML IJ SOLN
INTRAMUSCULAR | Status: AC | PRN
  Administered 2018-05-22 (×3): 1 mg via INTRAVENOUS

## 2018-05-22 MED ORDER — ROCURONIUM BROMIDE 100 MG/10ML IV SOLN
INTRAVENOUS | Status: DC | PRN
  Administered 2018-05-22: 30 mg via INTRAVENOUS
  Administered 2018-05-22 (×2): 20 mg via INTRAVENOUS

## 2018-05-22 MED ORDER — OXYCODONE-ACETAMINOPHEN 5-325 MG PO TABS
1.0000 | ORAL_TABLET | ORAL | Status: DC | PRN
  Administered 2018-05-23: 1 via ORAL
  Filled 2018-05-22: qty 1

## 2018-05-22 MED ORDER — PROPOFOL 10 MG/ML IV BOLUS
INTRAVENOUS | Status: AC
  Filled 2018-05-22: qty 20

## 2018-05-22 MED ORDER — METHYLPREDNISOLONE ACETATE 40 MG/ML IJ SUSP
INTRAMUSCULAR | Status: AC
  Filled 2018-05-22: qty 1

## 2018-05-22 MED ORDER — CIPROFLOXACIN IN D5W 400 MG/200ML IV SOLN
400.0000 mg | Freq: Once | INTRAVENOUS | Status: AC
  Administered 2018-05-22: 400 mg via INTRAVENOUS

## 2018-05-22 MED ORDER — PROPOFOL 10 MG/ML IV BOLUS
INTRAVENOUS | Status: DC | PRN
  Administered 2018-05-22: 130 mg via INTRAVENOUS

## 2018-05-22 SURGICAL SUPPLY — 65 items
ADAPTER IRRIG TUBE 2 SPIKE SOL (ADAPTER) ×6 IMPLANT
ADAPTER SCOPE UROLOK II (MISCELLANEOUS) ×3 IMPLANT
BAG URINE DRAINAGE (UROLOGICAL SUPPLIES) ×3 IMPLANT
BALLN NEPHROSTOMY 10MMX15CM (UROLOGICAL SUPPLIES) ×1
BALLN NEPHROSTOMY 10X15 (UROLOGICAL SUPPLIES) ×2 IMPLANT
BASKET ZERO TIP 1.9FR (BASKET) ×2 IMPLANT
BLADE SURG 15 STRL LF DISP TIS (BLADE) ×1 IMPLANT
BLADE SURG 15 STRL SS (BLADE) ×2
CATH COUNCIL 22FR (CATHETERS) IMPLANT
CATH FOLEY 2W COUNCIL 20FR 5CC (CATHETERS) ×2 IMPLANT
CATH FOLEY 2W COUNCIL 5CC 18FR (CATHETERS) IMPLANT
CATH STENT KAYE NEPHR TAMP (CATHETERS) IMPLANT
CATH URETL 5X70 OPEN END (CATHETERS) ×3 IMPLANT
CATH/STENT KAYE NEPHR TAMP (CATHETERS)
CHLORAPREP W/TINT 26ML (MISCELLANEOUS) ×3 IMPLANT
CNTNR SPEC 2.5X3XGRAD LEK (MISCELLANEOUS) ×1
CONT SPEC 4OZ STER OR WHT (MISCELLANEOUS) ×2
CONTAINER SPEC 2.5X3XGRAD LEK (MISCELLANEOUS) ×1 IMPLANT
COVER WAND RF STERILE (DRAPES) ×3 IMPLANT
DRAPE C-ARM XRAY 36X54 (DRAPES) ×3 IMPLANT
DRAPE SHEET LG 3/4 BI-LAMINATE (DRAPES) ×3 IMPLANT
DRAPE SURG 17X11 SM STRL (DRAPES) ×12 IMPLANT
GAUZE SPONGE 4X4 12PLY STRL (GAUZE/BANDAGES/DRESSINGS) ×3 IMPLANT
GLIDEWIRE STIFF .35X180X3 HYDR (WIRE) ×1 IMPLANT
GLOVE BIOGEL PI IND STRL 6.5 (GLOVE) ×2 IMPLANT
GLOVE BIOGEL PI INDICATOR 6.5 (GLOVE) ×8
GLOVE INDICATOR 7.5 STRL GRN (GLOVE) ×3 IMPLANT
GOWN STRL REUS W/ TWL XL LVL3 (GOWN DISPOSABLE) ×2 IMPLANT
GOWN STRL REUS W/TWL XL LVL3 (GOWN DISPOSABLE) ×4
GUIDEWIRE GREEN .038 145CM (MISCELLANEOUS) IMPLANT
GUIDEWIRE INTRO SET STRAIGHT (WIRE) ×3 IMPLANT
GUIDEWIRE STR DUAL SENSOR (WIRE) ×7 IMPLANT
GUIDEWIRE STR ZIPWIRE 035X150 (MISCELLANEOUS) IMPLANT
GUIDEWIRE SUPER STIFF (WIRE) IMPLANT
HOLDER FOLEY CATH W/STRAP (MISCELLANEOUS) ×3 IMPLANT
INTRODUCER DILATOR DOUBLE (INTRODUCER) IMPLANT
MANIFOLD NEPTUNE II (INSTRUMENTS) ×3 IMPLANT
MAT ABSORB  FLUID 56X50 GRAY (MISCELLANEOUS) ×4
MAT ABSORB FLUID 56X50 GRAY (MISCELLANEOUS) ×2 IMPLANT
NDL FASCIA INCISION 18GA (NEEDLE) ×3 IMPLANT
PACK BASIN MINOR ARMC (MISCELLANEOUS) ×3 IMPLANT
PAD ABD DERMACEA PRESS 5X9 (GAUZE/BANDAGES/DRESSINGS) ×4 IMPLANT
PAD PREP 24X41 OB/GYN DISP (PERSONAL CARE ITEMS) ×3 IMPLANT
PROBE CYBERWAND SET (MISCELLANEOUS) ×3 IMPLANT
SET IRRIGATING DISP (SET/KITS/TRAYS/PACK) ×3 IMPLANT
SHEET NEURO XL SOL CTL (MISCELLANEOUS) ×3 IMPLANT
SOL .9 NS 3000ML IRR  AL (IV SOLUTION) ×12
SOL .9 NS 3000ML IRR UROMATIC (IV SOLUTION) ×4 IMPLANT
SPONGE DRAIN TRACH 4X4 STRL 2S (GAUZE/BANDAGES/DRESSINGS) ×3 IMPLANT
STENT URET 6FRX24 CONTOUR (STENTS) IMPLANT
STENT URET 6FRX26 CONTOUR (STENTS) ×2 IMPLANT
STRAP SAFETY 5IN WIDE (MISCELLANEOUS) ×8 IMPLANT
SUT SILK 0 SH 30 (SUTURE) ×3 IMPLANT
SYR 10ML LL (SYRINGE) ×3 IMPLANT
SYR 20CC LL (SYRINGE) ×3 IMPLANT
SYR 30ML LL (SYRINGE) ×3 IMPLANT
SYRINGE IRR TOOMEY STRL 70CC (SYRINGE) ×3 IMPLANT
TAPE CLOTH 10X20 WHT NS LF (TAPE) ×1 IMPLANT
TAPE CLOTH 2X10 WHT NS LF (TAPE) ×2
TAPE MICROFOAM 4IN (TAPE) ×3 IMPLANT
TRAP SPECIMEN MUCOUS 40CC (MISCELLANEOUS) IMPLANT
TRAY FOLEY MTR SLVR 16FR STAT (SET/KITS/TRAYS/PACK) ×3 IMPLANT
TUBING CONNECTING 10 (TUBING) ×1 IMPLANT
TUBING CONNECTING 10' (TUBING)
WATER STERILE IRR 1000ML POUR (IV SOLUTION) ×3 IMPLANT

## 2018-05-22 NOTE — Transfer of Care (Signed)
Immediate Anesthesia Transfer of Care Note  Patient: Sheryl [name]  Procedure(s) Performed: NEPHROLITHOTOMY PERCUTANEOUS (Left Back)  Patient Location: PACU  Anesthesia Type:General  Level of Consciousness: sedated and responds to stimulation  Airway & Oxygen Therapy: Patient Spontanous Breathing and Patient connected to face mask oxygen  Post-op Assessment: Report given to RN and Post -op Vital signs reviewed and stable  Post vital signs: Reviewed and stable  Last Vitals:  Vitals Value Taken Time  BP 127/50 05/22/2018  1:52 PM  Temp 36.1 C 05/22/2018  1:49 PM  Pulse 80 05/22/2018  1:52 PM  Resp 15 05/22/2018  1:52 PM  SpO2 100 % 05/22/2018  1:52 PM  Vitals shown include unvalidated device data.  Last Pain:  Vitals:   05/22/18 1349  TempSrc:   PainSc: Asleep      Patients Stated Pain Goal: 0 (05/22/18 0622)  Complications: No apparent anesthesia complications

## 2018-05-22 NOTE — H&P (View-Only) (Signed)
 Chief Complaint: Patient was seen in consultation today for left percutaneous renal access and ureteral catheter placement at the request of Brandon,Ashley  Referring Physician(s): Brandon,Ashley  Patient Status: [facility] - Out-pt  History of Present Illness: Sheryl [name] is a 67 y.o. 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 Here for percutaneous access of left renal collecting system and ureter prior to planned operative PCNL by Dr. Brandon later this morning.  Past Medical History:  Diagnosis Date  . Anxiety   . Arthritis    lower back, left hip   . Chicken pox   . Chronic kidney disease   . Diabetes mellitus without complication (HCC)   . GERD (gastroesophageal reflux disease)   . History of hiatal hernia   . History of kidney stones   . Hypercholesterolemia   . Hypertension   . Migraines    rare now  . Nephrolithiasis     Past Surgical History:  Procedure Laterality Date  . BREAST CYST ASPIRATION Left    negative 2012  . CHOLECYSTECTOMY    . COLONOSCOPY    . COLONOSCOPY, ESOPHAGOGASTRODUODENOSCOPY (EGD) AND ESOPHAGEAL DILATION    . DISTAL BICEPS TENDON REPAIR Right 01/19/2017   Procedure: BICEPS TENODESIS;  Surgeon: Poggi, John J, MD;  Location: [facility];  Service: Orthopedics;  Laterality: Right;  Diabetic - oral meds  . ESOPHAGOGASTRODUODENOSCOPY (EGD) WITH PROPOFOL N/A 01/31/2015   Procedure: ESOPHAGOGASTRODUODENOSCOPY (EGD) WITH PROPOFOL;  Surgeon: Robert T Elliott, MD;  Location: [facility];  Service: Endoscopy;  Laterality: N/A;  . esophogastroduodenoscopy    . esophogastroduodeoscopy    . HERNIA REPAIR    . SAVORY DILATION N/A 01/31/2015   Procedure: SAVORY DILATION;  Surgeon: Robert T Elliott, MD;  Location: [facility];  Service: Endoscopy;  Laterality: N/A;  . SHOULDER ARTHROSCOPY Right 01/19/2017   Procedure: ARTHROSCOPY SHOULDER DEBRIDEMENT  DECOMPRESSION AND  ROTATOR CUFF REPAIR AND BICEPS TENODESIS;  Surgeon: Poggi, John J, MD;  Location: [facility];  Service: Orthopedics;  Laterality: Right;    Allergies: Patient has no known allergies.  Medications: Prior to Admission medications   Medication Sig Start Date End Date Taking? Authorizing Provider  Cholecalciferol (VITAMIN D3) 10 MCG (400 UNIT) CAPS Take 400 Units by mouth daily.    [provider]  citalopram (CELEXA) 20 MG tablet Take 20 mg by mouth daily.    [provider]  lisinopril (PRINIVIL,ZESTRIL) 5 MG tablet Take 5 mg by mouth daily.    [provider]  lovastatin (MEVACOR) 40 MG tablet Take 40 mg by mouth at bedtime.     [provider]  metFORMIN (GLUCOPHAGE) 500 MG tablet Take 500 mg by mouth daily with breakfast.     [provider]  naproxen (NAPROSYN) 500 MG tablet Take 1 tablet (500 mg total) by mouth 2 (two) times daily with a meal. 04/10/18   Triplett, Cari B, FNP  oxyCODONE (ROXICODONE) 5 MG immediate release tablet Take 1 tablet (5 mg total) by mouth every 8 (eight) hours as needed. Patient not taking: Reported on 05/10/2018 04/21/18 04/21/19  Stoioff, Scott C, MD  ranitidine (ZANTAC) 150 MG tablet Take 150 mg by mouth at bedtime.     [provider]  vitamin B-12 (CYANOCOBALAMIN) 1000 MCG tablet Take 2,000 mcg by mouth daily.    [provider]     No family history on file.  Social History   Socioeconomic History  .   Marital status: Married    Spouse name: Not on file  . Number of children: Not on file  . Years of education: Not on file  . Highest education level: Not on file  Occupational History  . Not on file  Social Needs  . Financial resource strain: Not on file  . Food insecurity:    Worry: Not on file    Inability: Not on file  . Transportation needs:    Medical: Not on file    Non-medical: Not on file  Tobacco Use  . Smoking status: Former Smoker    Last attempt to  quit: 2010    Years since quitting: 10.1  . Smokeless tobacco: Never Used  Substance and Sexual Activity  . Alcohol use: No  . Drug use: No  . Sexual activity: Yes    Birth control/protection: Post-menopausal  Lifestyle  . Physical activity:    Days per week: Not on file    Minutes per session: Not on file  . Stress: Not on file  Relationships  . Social connections:    Talks on phone: Not on file    Gets together: Not on file    Attends religious service: Not on file    Active member of club or organization: Not on file    Attends meetings of clubs or organizations: Not on file    Relationship status: Not on file  Other Topics Concern  . Not on file  Social History Narrative  . Not on file    Review of Systems: A 12 point ROS discussed and pertinent positives are indicated in the HPI above.  All other systems are negative.  Review of Systems  Constitutional: Negative.   HENT: Negative.   Respiratory: Negative.   Cardiovascular: Negative.   Gastrointestinal: Negative.   Genitourinary: Positive for flank pain and hematuria.  Neurological: Negative.     Vital Signs: BP (!) 129/58   Pulse (!) 49   Resp 15   SpO2 92%   Physical Exam Vitals signs reviewed.  Constitutional:      General: She is not in acute distress.    Appearance: She is not ill-appearing, toxic-appearing or diaphoretic.  HENT:     Head: Normocephalic and atraumatic.  Neck:     Musculoskeletal: Neck supple.  Cardiovascular:     Rate and Rhythm: Normal rate and regular rhythm.     Heart sounds: Normal heart sounds. No murmur. No gallop.   Pulmonary:     Effort: Pulmonary effort is normal. No respiratory distress.     Breath sounds: Normal breath sounds. No stridor. No wheezing, rhonchi or rales.  Abdominal:     General: There is no distension.     Palpations: Abdomen is soft.     Tenderness: There is no abdominal tenderness. There is no guarding or rebound.  Musculoskeletal:        General:  No swelling.  Lymphadenopathy:     Cervical: No cervical adenopathy.  Skin:    General: Skin is warm and dry.  Neurological:     Mental Status: She is alert and oriented to person, place, and time.     Imaging: No results found.  Labs:  CBC: Recent Labs    04/10/18 1556 05/11/18 1209 05/22/18 0650  WBC 11.8* 7.2 5.7  HGB 15.2* 15.4* 15.2*  HCT 46.4* 46.8* 45.5  PLT 296 270 286    COAGS: Recent Labs    05/11/18 1209 05/22/18 0650  INR 0.95 0.98  APTT  --  29    BMP: Recent Labs    04/10/18 1556 05/11/18 1209  NA 139 140  K 3.5 3.9  CL 106 104  CO2 25 29  GLUCOSE 124* 103*  BUN 10 17  CALCIUM 9.1 9.4  CREATININE 1.08* 0.61  GFRNONAA 54* >60  GFRAA >60 >60    Assessment and Plan:  For left percutaneous renal/ureteral catheter placement prior to PCNL today. Risks and benefits of the procedure were discussed with the patient including, but not limited to, infection, bleeding, significant bleeding causing loss or decrease in renal function or damage to adjacent structures. All of the patient's questions were answered, patient is agreeable to proceed. Consent signed and in chart.  Thank you for this interesting consult.  I greatly enjoyed meeting Sheryl [name] and look forward to participating in their care.  A copy of this report was sent to the requesting provider on this date.  Electronically Signed: Reola Calkins, MD 05/22/2018, 8:14 AM   I spent a total of 30 Minutes  in face to face in clinical consultation, greater than 50% of which was counseling/coordinating care for percutaneous renal access of left kidney.

## 2018-05-22 NOTE — H&P (Signed)
Chief Complaint: Patient was seen in consultation today for left percutaneous renal access and ureteral catheter placement at the request of Brandon,Ashley  Referring Physician(s): Brandon,Ashley  Patient Status: [facility] - Out-pt  History of Present Illness: Sheryl [name] is a 68 y.o. 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 Here for percutaneous access of left renal collecting system and ureter prior to planned operative PCNL by Dr. Apolinar JunesBrandon later this morning.  Past Medical History:  Diagnosis Date  . Anxiety   . Arthritis    lower back, left hip   . Chicken pox   . Chronic kidney disease   . Diabetes mellitus without complication (HCC)   . GERD (gastroesophageal reflux disease)   . History of hiatal hernia   . History of kidney stones   . Hypercholesterolemia   . Hypertension   . Migraines    rare now  . Nephrolithiasis     Past Surgical History:  Procedure Laterality Date  . BREAST CYST ASPIRATION Left    negative 2012  . CHOLECYSTECTOMY    . COLONOSCOPY    . COLONOSCOPY, ESOPHAGOGASTRODUODENOSCOPY (EGD) AND ESOPHAGEAL DILATION    . DISTAL BICEPS TENDON REPAIR Right 01/19/2017   Procedure: BICEPS TENODESIS;  Surgeon: Christena FlakePoggi, John J, MD;  [facility];  Service: Orthopedics;  Laterality: Right;  Diabetic - oral meds  . ESOPHAGOGASTRODUODENOSCOPY (EGD) WITH PROPOFOL N/A 01/31/2015   Procedure: ESOPHAGOGASTRODUODENOSCOPY (EGD) WITH PROPOFOL;  Surgeon: Scot Junobert T Elliott, MD;  [facility];  Service: Endoscopy;  Laterality: N/A;  . esophogastroduodenoscopy    . esophogastroduodeoscopy    . HERNIA REPAIR    . SAVORY DILATION N/A 01/31/2015   Procedure: SAVORY DILATION;  Surgeon: Scot Junobert T Elliott, MD;  [facility];  Service: Endoscopy;  Laterality: N/A;  . SHOULDER ARTHROSCOPY Right 01/19/2017   Procedure: ARTHROSCOPY SHOULDER DEBRIDEMENT  DECOMPRESSION AND  ROTATOR CUFF REPAIR AND BICEPS TENODESIS;  Surgeon: Christena FlakePoggi, John J, MD;  Location: [facility];  Service: Orthopedics;  Laterality: Right;    Allergies: Patient has no known allergies.  Medications: Prior to Admission medications   Medication Sig Start Date End Date Taking? Authorizing Provider  Cholecalciferol (VITAMIN D3) 10 MCG (400 UNIT) CAPS Take 400 Units by mouth daily.    [provider]  citalopram (CELEXA) 20 MG tablet Take 20 mg by mouth daily.    [provider]  lisinopril (PRINIVIL,ZESTRIL) 5 MG tablet Take 5 mg by mouth daily.    [provider]  lovastatin (MEVACOR) 40 MG tablet Take 40 mg by mouth at bedtime.     [provider]  metFORMIN (GLUCOPHAGE) 500 MG tablet Take 500 mg by mouth daily with breakfast.     [provider]  naproxen (NAPROSYN) 500 MG tablet Take 1 tablet (500 mg total) by mouth 2 (two) times daily with a meal. 04/10/18   Triplett, Cari B, FNP  oxyCODONE (ROXICODONE) 5 MG immediate release tablet Take 1 tablet (5 mg total) by mouth every 8 (eight) hours as needed. Patient not taking: Reported on 05/10/2018 04/21/18 04/21/19  Riki AltesStoioff, Scott C, MD  ranitidine (ZANTAC) 150 MG tablet Take 150 mg by mouth at bedtime.     [provider]  vitamin B-12 (CYANOCOBALAMIN) 1000 MCG tablet Take 2,000 mcg by mouth daily.    [provider]     No family history on file.  Social History   Socioeconomic History  .  Marital status: Married    Spouse name: Not on file  . Number of children: Not on file  . Years of education: Not on file  . Highest education level: Not on file  Occupational History  . Not on file  Social Needs  . Financial resource strain: Not on file  . Food insecurity:    Worry: Not on file    Inability: Not on file  . Transportation needs:    Medical: Not on file    Non-medical: Not on file  Tobacco Use  . Smoking status: Former Smoker    Last attempt to  quit: 2010    Years since quitting: 10.1  . Smokeless tobacco: Never Used  Substance and Sexual Activity  . Alcohol use: No  . Drug use: No  . Sexual activity: Yes    Birth control/protection: Post-menopausal  Lifestyle  . Physical activity:    Days per week: Not on file    Minutes per session: Not on file  . Stress: Not on file  Relationships  . Social connections:    Talks on phone: Not on file    Gets together: Not on file    Attends religious service: Not on file    Active member of club or organization: Not on file    Attends meetings of clubs or organizations: Not on file    Relationship status: Not on file  Other Topics Concern  . Not on file  Social History Narrative  . Not on file    Review of Systems: A 12 point ROS discussed and pertinent positives are indicated in the HPI above.  All other systems are negative.  Review of Systems  Constitutional: Negative.   HENT: Negative.   Respiratory: Negative.   Cardiovascular: Negative.   Gastrointestinal: Negative.   Genitourinary: Positive for flank pain and hematuria.  Neurological: Negative.     Vital Signs: BP (!) 129/58   Pulse (!) 49   Resp 15   SpO2 92%   Physical Exam Vitals signs reviewed.  Constitutional:      General: She is not in acute distress.    Appearance: She is not ill-appearing, toxic-appearing or diaphoretic.  HENT:     Head: Normocephalic and atraumatic.  Neck:     Musculoskeletal: Neck supple.  Cardiovascular:     Rate and Rhythm: Normal rate and regular rhythm.     Heart sounds: Normal heart sounds. No murmur. No gallop.   Pulmonary:     Effort: Pulmonary effort is normal. No respiratory distress.     Breath sounds: Normal breath sounds. No stridor. No wheezing, rhonchi or rales.  Abdominal:     General: There is no distension.     Palpations: Abdomen is soft.     Tenderness: There is no abdominal tenderness. There is no guarding or rebound.  Musculoskeletal:        General:  No swelling.  Lymphadenopathy:     Cervical: No cervical adenopathy.  Skin:    General: Skin is warm and dry.  Neurological:     Mental Status: She is alert and oriented to person, place, and time.     Imaging: No results found.  Labs:  CBC: Recent Labs    04/10/18 1556 05/11/18 1209 05/22/18 0650  WBC 11.8* 7.2 5.7  HGB 15.2* 15.4* 15.2*  HCT 46.4* 46.8* 45.5  PLT 296 270 286    COAGS: Recent Labs    05/11/18 1209 05/22/18 0650  INR 0.95 0.98  APTT  --  29    BMP: Recent Labs    04/10/18 1556 05/11/18 1209  NA 139 140  K 3.5 3.9  CL 106 104  CO2 25 29  GLUCOSE 124* 103*  BUN 10 17  CALCIUM 9.1 9.4  CREATININE 1.08* 0.61  GFRNONAA 54* >60  GFRAA >60 >60    Assessment and Plan:  For left percutaneous renal/ureteral catheter placement prior to PCNL today. Risks and benefits of the procedure were discussed with the patient including, but not limited to, infection, bleeding, significant bleeding causing loss or decrease in renal function or damage to adjacent structures. All of the patient's questions were answered, patient is agreeable to proceed. Consent signed and in chart.  Thank you for this interesting consult.  I greatly enjoyed meeting Sheryl [name] and look forward to participating in their care.  A copy of this report was sent to the requesting provider on this date.  Electronically Signed: Reola Calkins, MD 05/22/2018, 8:14 AM   I spent a total of 30 Minutes  in face to face in clinical consultation, greater than 50% of which was counseling/coordinating care for percutaneous renal access of left kidney.

## 2018-05-22 NOTE — Plan of Care (Signed)
Nephrostomy tube to be clamp at 5am. The patient has been stable.  Problem: Education: Goal: Knowledge of General Education information will improve Description Including pain rating scale, medication(s)/side effects and non-pharmacologic comfort measures Outcome: Progressing   Problem: Health Behavior/Discharge Planning: Goal: Ability to manage health-related needs will improve Outcome: Progressing   Problem: Clinical Measurements: Goal: Ability to maintain clinical measurements within normal limits will improve Outcome: Progressing Goal: Will remain free from infection Outcome: Progressing Goal: Diagnostic test results will improve Outcome: Progressing Goal: Respiratory complications will improve Outcome: Progressing Goal: Cardiovascular complication will be avoided Outcome: Progressing   Problem: Activity: Goal: Risk for activity intolerance will decrease Outcome: Progressing   Problem: Nutrition: Goal: Adequate nutrition will be maintained Outcome: Progressing   Problem: Coping: Goal: Level of anxiety will decrease Outcome: Progressing   Problem: Elimination: Goal: Will not experience complications related to bowel motility Outcome: Progressing Goal: Will not experience complications related to urinary retention Outcome: Progressing   Problem: Pain Managment: Goal: General experience of comfort will improve Outcome: Progressing   Problem: Safety: Goal: Ability to remain free from injury will improve Outcome: Progressing   Problem: Skin Integrity: Goal: Risk for impaired skin integrity will decrease Outcome: Progressing

## 2018-05-22 NOTE — Anesthesia Procedure Notes (Signed)
Procedure Name: Intubation Date/Time: 05/22/2018 11:29 AM Performed by: Omer Jack, CRNA Pre-anesthesia Checklist: Patient identified, Patient being monitored, Timeout performed, Emergency Drugs available and Suction available Patient Re-evaluated:Patient Re-evaluated prior to induction Oxygen Delivery Method: Circle system utilized Preoxygenation: Pre-oxygenation with 100% oxygen Induction Type: IV induction Ventilation: Mask ventilation without difficulty Laryngoscope Size: 3 and McGraph Grade View: Grade I Tube type: Oral Tube size: 7.0 mm Number of attempts: 1 Airway Equipment and Method: Stylet Placement Confirmation: ETT inserted through vocal cords under direct vision,  positive ETCO2 and breath sounds checked- equal and bilateral Secured at: 21 cm Tube secured with: Tape Dental Injury: Teeth and Oropharynx as per pre-operative assessment  Difficulty Due To: Difficulty was anticipated

## 2018-05-22 NOTE — Interval H&P Note (Signed)
History and Physical Interval Note:  05/22/2018 11:05 AM  Sheryl [name]  has presented today for surgery, with the diagnosis of left staghorn calculus  The various methods of treatment have been discussed with the patient and family. After consideration of risks, benefits and other options for treatment, the patient has consented to  Procedure(s): NEPHROLITHOTOMY PERCUTANEOUS (Left) as a surgical intervention .  The patient's history has been reviewed, patient examined, no change in status, stable for surgery.  I have reviewed the patient's chart and labs.  Questions were answered to the patient's satisfaction.    RRR CTAB  Patient was initially seen and evaluated by Dr. Lonna Cobb.  She ultimately elected to undergo left PCNL for her large left lower pole stone.  She underwent interventional radiology procedure this a.m. by Dr. Irish Lack for percutaneous placement of a nephrostomy tube.  This was well-tolerated although she does have some nausea in the preoperative holding area.  We discussed all risk and benefits of PCNL today including risk of bleeding, infection, damage surrounding structures, need for multiple procedures, amongst others.  All questions were answered. Vanna Scotland

## 2018-05-22 NOTE — Anesthesia Postprocedure Evaluation (Signed)
Anesthesia Post Note  Patient: Sheryl [name]  Procedure(s) Performed: NEPHROLITHOTOMY PERCUTANEOUS (Left Back)  Patient location during evaluation: PACU Anesthesia Type: General Level of consciousness: awake and alert and oriented Pain management: pain level controlled Vital Signs Assessment: post-procedure vital signs reviewed and stable Respiratory status: spontaneous breathing, nonlabored ventilation and respiratory function stable Cardiovascular status: blood pressure returned to baseline and stable Postop Assessment: no signs of nausea or vomiting Anesthetic complications: no     Last Vitals:  Vitals:   05/22/18 1445 05/22/18 1449  BP:  (!) 118/55  Pulse: 88 87  Resp: 15 14  Temp:    SpO2: 96% 95%    Last Pain:  Vitals:   05/22/18 1449  TempSrc:   PainSc: Asleep                 Marieta Markov

## 2018-05-22 NOTE — Anesthesia Preprocedure Evaluation (Signed)
Anesthesia Evaluation  Patient identified by MRN, date of birth, ID band Patient awake    Reviewed: Allergy & Precautions, NPO status , Patient's Chart, lab work & pertinent test results  History of Anesthesia Complications Negative for: history of anesthetic complications  Airway Mallampati: II  TM Distance: >3 FB Neck ROM: Full    Dental no notable dental hx.    Pulmonary neg sleep apnea, neg COPD, former smoker,    breath sounds clear to auscultation- rhonchi (-) wheezing      Cardiovascular hypertension, Pt. on medications (-) CAD, (-) Past MI, (-) Cardiac Stents and (-) CABG  Rhythm:Regular Rate:Normal - Systolic murmurs and - Diastolic murmurs    Neuro/Psych  Headaches, Anxiety    GI/Hepatic Neg liver ROS, hiatal hernia, GERD  ,  Endo/Other  diabetes, Oral Hypoglycemic Agents  Renal/GU Renal disease: nephrolithiasis.     Musculoskeletal  (+) Arthritis ,   Abdominal (+) + obese,   Peds  Hematology negative hematology ROS (+)   Anesthesia Other Findings Past Medical History: No date: Anxiety No date: Arthritis     Comment:  lower back, left hip  No date: Chicken pox No date: Chronic kidney disease No date: Diabetes mellitus without complication (HCC) No date: GERD (gastroesophageal reflux disease) No date: History of hiatal hernia No date: History of kidney stones No date: Hypercholesterolemia No date: Hypertension No date: Migraines     Comment:  rare now No date: Nephrolithiasis   Reproductive/Obstetrics                             Anesthesia Physical Anesthesia Plan  ASA: III  Anesthesia Plan: General   Post-op Pain Management:    Induction: Intravenous  PONV Risk Score and Plan: 2 and Ondansetron, Dexamethasone and Midazolam  Airway Management Planned: Oral ETT  Additional Equipment:   Intra-op Plan:   Post-operative Plan: Extubation in OR  Informed  Consent: I have reviewed the patients History and Physical, chart, labs and discussed the procedure including the risks, benefits and alternatives for the proposed anesthesia with the patient or authorized representative who has indicated his/her understanding and acceptance.     Dental advisory given  Plan Discussed with: CRNA and Anesthesiologist  Anesthesia Plan Comments:         Anesthesia Quick Evaluation

## 2018-05-22 NOTE — Procedures (Signed)
Interventional Radiology Procedure Note  Procedure: Left percutaneous renal/ureteral access and catheter placement  Complications: None  Estimated Blood Loss: 10 mL  Findings: After access of the LP collecting system, able to advance catheter into a dilated LP calyx. Could not advance a 5 Fr catheter beyond calyx due to impacted calculus in LP infundibulum.  Additional interpolar access performed, allowing advancement of 5 Fr catheter around calculus in renal pelvis and down ureter into bladder.  For PCNL in OR with Dr. Apolinar Junes to follow later today.  Jodi Marble. Fredia Sorrow, M.D Pager:  [phone]

## 2018-05-22 NOTE — Op Note (Signed)
Date of procedure: 05/22/18  Preoperative diagnosis:  1. Left partial staghorn calculus, 3.2 cm  Postoperative diagnosis:  1. Same as above  Procedure: 1. Left percutaneous nephrolithotomy 2. Left antegrade nephrostogram 3. Antegrade placement of double-J ureteral stent 4. Basket extraction of stone fragment 5. Placement of left percutaneous nephrostomy tube  Surgeon: Vanna Scotland, MD  Anesthesia: General  Complications: None  Intraoperative findings: Stenotic but patent UPJ.  Midpole and renal pelvic stone treated, portion of the lower pole stone treated, unclear whether the entire lower pole stone burden was adequately cleared due to limited visualization.  EBL: 200 cc  Specimens: Stone fragment  Drains: 6 x 26 French double-J ureteral stent on left, 18 French percutaneous nephrostomy tube, 16 French Foley catheter  Indication: Sheryl [name] is a 68 y.o. patient with 3.1 cm left partial staghorn calculus.  After reviewing the management options for treatment, she elected to proceed with the above surgical procedure(s). We have discussed the potential benefits and risks of the procedure, side effects of the proposed treatment, the likelihood of the patient achieving the goals of the procedure, and any potential problems that might occur during the procedure or recuperation. Informed consent has been obtained.  She was taken to interventional radiology where midpole access was obtained.  Lower pole access was unable to be obtained secondary to the size degree of stone impaction.  Description of procedure:  The patient was taken to the operating room and general anesthesia was induced.  She was intubated on the stretcher and then repositioned in the prone position after Foley catheter was placed with care to pad all pressure points. She was draped in the usual sterile fashion, and preoperative antibiotics were administered. A preoperative time-out was performed.   Scout imaging  performed at the beginning of the case showed delayed excretion of previously placed contrast material, presumably at the time of nephroureteral catheter placement.  The nephroureteral catheter could seen traversing a mid lower pole calyx down the UPJ into the bladder.  There was hydronephrosis appreciated.  There is a filling defect with a stone was seen occupying a large portion of the mid and lower pole.  First, the nephroureteral catheter was cannulate using a Super Stiff wire.  Safety wire introducer was then used to introduce a second sensor wire down to the level of the bladder.  This went easily.  The flank incision was then extended to about 1.5 cm large accommodate the sheath.  A fascial incisor needle used to used to incise the fascia in a cruciate incision.  A NephroMax balloon was then advanced to a was felt to be the tip of the calyx and under fluoroscopic guidance, inflated to 18 cm of water.  This was left in place for several minutes.  The sheath was then advanced over the balloon under fluoroscopic guidance as well.  The balloon was deflated and there was a small amount of bleeding but none significantly.  Nephroscope was then brought in and the stone was immediately encountered.  A steroid was used to fragment the very hard stone in the renal pelvis.  Notably, some of the stones had refluxed up into an upper pole calyx which was also fairly easily accessible and the stones were fragmented.  A good number of the fragments were then removed using Parada forceps including some fragments at the UPJ with exquisite care not to disrupt this.  Notably, the UPJ did have a relatively scarlike appearance was narrow but traversable with a flexible cystoscope  later in the case.  Ultimately, I was able to angle the nephroscope into a lower pole calyx where at least 1 of the stones was treated.  At this point in time, I did have some issues accommodating the scope into the lower pole and some mucosal trauma  which started bleeding and impaired visualization.  I try to use a flexible cystoscope to get down to the lower pole with some success with a basket but ultimately, is unsure whether or not there was residual stone burden the left lower pole.  The renal pelvic stone and midpole stone were adequately addressed during the case.  Finally, some contrast was injected through the scope no obvious extravasation was appreciated.  Contrast did traverse down the ureter.  In order to plan for possible staged procedure, I did elect to place a antegrade double-J ureteral stent.  In order to accommodate this, I advanced a third Super Stiff wire down to the level of the bladder.  Under direct visualization using the nephroscope, 6 x 26 French double-J ureteral stent was advanced down to level the bladder.  The wires partially drawn a focal was noted within the bladder crossing the midline.  The wires and fully withdrawn and a coil was looped into a lower pole calyx.  Finally, an [address] [place] [employer] tip Foley was advanced over the remaining working wire into the renal pelvis and the balloon was inflated with 1.5 cc of diluted contrast solution.  The sheaths backed out and a final robust antegrade nephrostogram was performed showing a small amount of extravasation through the tract but no other extravasation.  There was a filling defect within the lower pole, either representing clot versus stone.  Stone was not seen on scout imaging prior to the antegrade.  Contrast did traverse down the ureter quite well without filling defects.  The stent appeared to be in good position.  The nephrostomy tube was then secured in place with silk x2.  The patient was then cleaned and dried and a dressing of 4 x 4's ABD pads and foam tape was applied.  The patient was then repositioned in the supine position on the stretcher, reversed anesthesia and taken the PACU in stable condition.  Plan: Patient will be admitted overnight for observation.  Will  remove her tubes serially tomorrow.  We will plan for CT scan next week to assess for need for possible staged ureteroscopic procedure down the road.  Vanna Scotland, M.D.

## 2018-05-22 NOTE — OR Nursing (Addendum)
Pt arrived via stretcher from IR, pt sleepy arousable to verbal stimuli, pt c/o cramping in bladder, pt placed on cardiac monitor, sinus bradycardia noted, VSS, left flank dressing dry and intact.pt c/o nausea, Dr Priscella Mann notified, zofran ordered and given, pt to BR via wheelchair voided bloody urine, pt states cramping decreased after voiding,

## 2018-05-22 NOTE — Anesthesia Post-op Follow-up Note (Signed)
Anesthesia QCDR form completed.        

## 2018-05-23 ENCOUNTER — Encounter: Payer: Self-pay | Admitting: Urology

## 2018-05-23 ENCOUNTER — Telehealth: Payer: Self-pay | Admitting: Urology

## 2018-05-23 DIAGNOSIS — R109 Unspecified abdominal pain: Secondary | ICD-10-CM

## 2018-05-23 DIAGNOSIS — N200 Calculus of kidney: Secondary | ICD-10-CM

## 2018-05-23 DIAGNOSIS — N132 Hydronephrosis with renal and ureteral calculous obstruction: Secondary | ICD-10-CM | POA: Diagnosis not present

## 2018-05-23 LAB — BASIC METABOLIC PANEL
Anion gap: 2 — ABNORMAL LOW (ref 5–15)
BUN: 9 mg/dL (ref 8–23)
CO2: 24 mmol/L (ref 22–32)
Calcium: 8 mg/dL — ABNORMAL LOW (ref 8.9–10.3)
Chloride: 115 mmol/L — ABNORMAL HIGH (ref 98–111)
Creatinine, Ser: 0.79 mg/dL (ref 0.44–1.00)
GFR calc Af Amer: >60 mL/min (ref >60)
GFR calc non Af Amer: >60 mL/min (ref >60)
GLUCOSE: 115 mg/dL — AB (ref 70–99)
Potassium: 4 mmol/L (ref 3.5–5.1)
Sodium: 141 mmol/L (ref 135–145)

## 2018-05-23 LAB — CBC
HCT: 36.6 % (ref 36.0–46.0)
Hemoglobin: 12 g/dL (ref 12.0–15.0)
MCH: 30.4 pg (ref 26.0–34.0)
MCHC: 32.8 g/dL (ref 30.0–36.0)
MCV: 92.7 fL (ref 80.0–100.0)
Platelets: 280 10*3/uL (ref 150–400)
RBC: 3.95 MIL/uL (ref 3.87–5.11)
RDW: 12.8 % (ref 11.5–15.5)
WBC: 16.7 10*3/uL — ABNORMAL HIGH (ref 4.0–10.5)
nRBC: 0 % (ref 0.0–0.2)

## 2018-05-23 LAB — GLUCOSE, CAPILLARY
Glucose-Capillary: 141 mg/dL — ABNORMAL HIGH (ref 70–99)
Glucose-Capillary: 122 mg/dL — ABNORMAL HIGH (ref 70–99)
Glucose-Capillary: 122 mg/dL — ABNORMAL HIGH (ref 70–99)

## 2018-05-23 MED ORDER — TAMSULOSIN HCL 0.4 MG PO CAPS: 0.4000 mg | ORAL_CAPSULE | Freq: Every day | ORAL | 0 refills | Status: DC

## 2018-05-23 MED ORDER — OXYCODONE HCL 5 MG PO TABS: 5.0000 mg | ORAL_TABLET | Freq: Three times a day (TID) | ORAL | 0 refills | Status: DC | PRN

## 2018-05-23 MED ORDER — DOCUSATE SODIUM 100 MG PO CAPS: 100.0000 mg | ORAL_CAPSULE | Freq: Two times a day (BID) | ORAL | 0 refills | Status: DC

## 2018-05-23 MED ORDER — OXYBUTYNIN CHLORIDE 5 MG PO TABS: 5.0000 mg | ORAL_TABLET | Freq: Three times a day (TID) | ORAL | 0 refills | Status: DC | PRN

## 2018-05-23 NOTE — Care Management Obs Status (Signed)
MEDICARE OBSERVATION STATUS NOTIFICATION   Patient Details  Name: Sheryl [name] MRN: [id number] Date of Birth: [birth year]   Medicare Observation Status Notification Given:  No(Admitted obs less than 24 hours)    Chapman Fitch, RN 05/23/2018, 2:54 PM

## 2018-05-23 NOTE — Telephone Encounter (Signed)
This patient is being discharged from the hospital today.  I like her to have a noncontrast CT scan next week and see me in follow-up in 2 weeks.  Order placed in this encounter.  Vanna Scotland, MD

## 2018-05-23 NOTE — Discharge Summary (Signed)
Date of admission: 05/22/2018  Date of discharge: 05/23/2018  Admission diagnosis: Left partial staghorn calculus, 3.2 cm  Discharge diagnosis: Same as above  Secondary diagnoses:  Patient Active Problem List   Diagnosis Date Noted  . Kidney stone on left side 05/22/2018  . 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 Biceps tendinitis of right upper extremity 01/21/2017  . Urine test positive for microalbuminuria 08/31/2016  . Chronic insomnia 08/20/2014  . Anxiety 02/19/2014  . Diabetes mellitus type 2, uncomplicated ([place]) 40/98/1191  . Hypercholesterolemia 01/22/2014  . Hypertension 01/22/2014  . Back pain 07/05/2013  . Foreign body in bladder and urethra 12/07/2012  . Renal colic 47/82/9562  . Disorder of calcium metabolism 03/09/2012  . Gross hematuria 03/09/2012  . Hydronephrosis 03/09/2012  . Kidney stone 03/09/2012  . Mixed urge and stress incontinence 03/09/2012  . Ureteric stone 03/09/2012    History and Physical: For full details, please see admission history and physical. Briefly, Sheryl [name] is a 68 y.o. year old patient with left partial staghorn calculus who was taken to interventional radiology for percutaneous access followed by PCNL.  She was admitted postop for observation.Marland Kitchen   Hospital Course: Patient tolerated the procedure well.  She was then transferred to the floor after an uneventful PACU stay.  Her hospital course was uncomplicated.  Foley was removed on postop day 1 and her from nephrostomy tube was clamped.  She tolerated this well and was removed later in the day.  On POD#1 she had met discharge criteria: was eating a regular diet, was up and ambulating independently,  pain was well controlled, was voiding without a catheter, and was ready to for discharge.  Physical Exam Vitals signs reviewed.  Constitutional:      Appearance: Normal appearance.  HENT:     Head: Normocephalic and atraumatic.  Cardiovascular:     Rate and  Rhythm: Normal rate.  Pulmonary:     Effort: Pulmonary effort is normal. No respiratory distress.  Abdominal:     General: Abdomen is flat. There is no distension.     Palpations: Abdomen is soft. There is no mass.  Genitourinary:    Comments: Urethral Foley catheter draining light pink urine.  Left nephrostomy tube site clean dry and intact.  Nephrostomy tube clamped. Skin:    General: Skin is warm and dry.  Neurological:     General: No focal deficit present.     Mental Status: She is alert and oriented to person, place, and time.  Psychiatric:        Mood and Affect: Mood normal.     Laboratory values:  Recent Labs    05/22/18 0650 05/23/18 0341  WBC 5.7 16.7*  HGB 15.2* 12.0  HCT 45.5 36.6   Recent Labs    05/23/18 0341  NA 141  K 4.0  CL 115*  CO2 24  GLUCOSE 115*  BUN 9  CREATININE 0.79  CALCIUM 8.0*   Recent Labs    05/22/18 0650  INR 0.98   No results for input(s): LABURIN in the last 72 hours. Results for orders placed or performed in visit on 05/16/18  Microscopic Examination     Status: Abnormal   Collection Time: 05/16/18  2:31 PM  Result Value Ref Range Status   WBC, UA None seen 0 - 5 /hpf Final   RBC, UA 0-2 0 - 2 /hpf Final   Epithelial Cells (non renal) 0-10 0 - 10 /hpf Final   Renal  Epithel, UA 0-10 (A) None seen /hpf Final   Mucus, UA Present (A) Not Estab. Final   Bacteria, UA Few (A) None seen/Few Final  CULTURE, URINE COMPREHENSIVE     Status: None   Collection Time: 05/16/18  2:54 PM  Result Value Ref Range Status   Urine Culture, Comprehensive Final report  Final   Organism ID, Bacteria Comment  Final    Comment: Mixed urogenital flora 10,000-25,000 colony forming units per mL     Disposition: Home  Discharge instruction: You have a small wound on your left flank.  This will drain urine, copiously for the first few days and then slow down over time.  You will need to keep a dressing covering this to keep your close clean and  dry.  You may shower with a watertight dressing.  Discharge medications:  Allergies as of 05/23/2018   No Known Allergies     Medication List    TAKE these medications   citalopram 20 MG tablet Commonly known as:  CELEXA Take 20 mg by mouth daily.   docusate sodium 100 MG capsule Commonly known as:  COLACE Take 1 capsule (100 mg total) by mouth 2 (two) times daily.   lisinopril 5 MG tablet Commonly known as:  PRINIVIL,ZESTRIL Take 5 mg by mouth daily.   lovastatin 40 MG tablet Commonly known as:  MEVACOR Take 40 mg by mouth at bedtime.   metFORMIN 500 MG tablet Commonly known as:  GLUCOPHAGE Take 500 mg by mouth daily with breakfast.   naproxen 500 MG tablet Commonly known as:  NAPROSYN Take 1 tablet (500 mg total) by mouth 2 (two) times daily with a meal.   oxybutynin 5 MG tablet Commonly known as:  DITROPAN Take 1 tablet (5 mg total) by mouth every 8 (eight) hours as needed for bladder spasms.   oxyCODONE 5 MG immediate release tablet Commonly known as:  ROXICODONE Take 1 tablet (5 mg total) by mouth every 8 (eight) hours as needed.   ranitidine 150 MG tablet Commonly known as:  ZANTAC Take 150 mg by mouth at bedtime.   tamsulosin 0.4 MG Caps capsule Commonly known as:  FLOMAX Take 1 capsule (0.4 mg total) by mouth daily.   vitamin B-12 1000 MCG tablet Commonly known as:  CYANOCOBALAMIN Take 2,000 mcg by mouth daily.   Vitamin D3 10 MCG (400 UNIT) Caps Take 400 Units by mouth daily.       Followup:  Follow-up Information    Hollice Espy, MD In 2 weeks.   Specialty:  Urology Why:  with CT stone prior Contact information: [place] [address] [phone]

## 2018-05-23 NOTE — Telephone Encounter (Signed)
App made and given to [place] on [employer]

## 2018-06-01 ENCOUNTER — Ambulatory Visit
Admission: RE | Admit: 2018-06-01 | Discharge: 2018-06-01 | Disposition: A | Discharge status: Home / Self Care | Payer: Medicare Other | Source: Ambulatory Visit | Attending: Urology | Admitting: Urology

## 2018-06-01 DIAGNOSIS — N200 Calculus of kidney: Secondary | ICD-10-CM | POA: Diagnosis present

## 2018-06-01 DIAGNOSIS — R109 Unspecified abdominal pain: Secondary | ICD-10-CM | POA: Diagnosis not present

## 2018-06-02 ENCOUNTER — Ambulatory Visit: Payer: Medicare Other

## 2018-06-06 LAB — CALCULI, WITH PHOTOGRAPH (CLINICAL LAB)
Calcium Oxalate Monohydrate: 100 %
Weight Calculi: 566.8 mg

## 2018-06-06 LAB — CALCULI, WITH PHOTOGRAPH

## 2018-06-08 ENCOUNTER — Ambulatory Visit (INDEPENDENT_AMBULATORY_CARE_PROVIDER_SITE_OTHER): Payer: Medicare Other | Admitting: Urology

## 2018-06-08 ENCOUNTER — Telehealth: Payer: Self-pay | Admitting: Radiology

## 2018-06-08 VITALS — BP 150/84 | HR 93 | Ht 64.0 in | Wt 181.0 lb

## 2018-06-08 DIAGNOSIS — N200 Calculus of kidney: Secondary | ICD-10-CM

## 2018-06-08 LAB — MICROSCOPIC EXAMINATION

## 2018-06-08 LAB — URINALYSIS, COMPLETE
Bilirubin, UA: NEGATIVE
Glucose, UA: NEGATIVE
Ketones, UA: NEGATIVE
Nitrite, UA: NEGATIVE
Specific Gravity, UA: >1.03 — ABNORMAL HIGH (ref 1.005–1.030)
Urobilinogen, Ur: 0.2 mg/dL (ref 0.2–1.0)
pH, UA: 5 (ref 5.0–7.5)

## 2018-06-08 NOTE — Progress Notes (Signed)
06/08/2018 10:11 AM   Sheryl [name] [birth year] [id number]  Referring provider: Marina Goodell, MD [address] [place], [place] [address]  Chief Complaint  Patient presents with  . Nephrolithiasis    Hosptial follow up to discuss options    HPI: 68 year old female with personal history of nephrolithiasis status post left PCNL on 05/22/2018 returns today for routine follow-up.  Intraoperatively, access was difficult and that there was high suspicion of residual stone burden.  An antegrade double-J ureteral stent was placed.  She follows up today with a CT scan which demonstrates significantly decreased overall stone burden on the left with residual stone fragments in the mid and lower poles.  Additional postoperative changes are identified.  She also has a 6 mm right lower pole nonobstructing stone.  Overall, she reports that she is doing well.  She is no longer leaking from her back.  Does have intermittent gross hematuria related to her stent as well as some lower abdominal discomfort specifically with activity likely from the stent as well.  No dysuria, fevers or chills.  No flank pain.  Stone composition 100% calcium oxalate monohydrate.  PMH: Past Medical History:  Diagnosis Date  . Anxiety   . Arthritis    lower back, left hip   . Chicken pox   . Chronic kidney disease   . Diabetes mellitus without complication (HCC)   . GERD (gastroesophageal reflux disease)   . History of hiatal hernia   . History of kidney stones   . Hypercholesterolemia   . Hypertension   . Migraines    rare now  . Nephrolithiasis     Surgical History: Past Surgical History:  Procedure Laterality Date  . BREAST CYST ASPIRATION Left    negative 2012  . CHOLECYSTECTOMY    . COLONOSCOPY    . COLONOSCOPY, ESOPHAGOGASTRODUODENOSCOPY (EGD) AND ESOPHAGEAL DILATION    . DISTAL BICEPS TENDON REPAIR Right 01/19/2017   Procedure: BICEPS TENODESIS;  Surgeon: Christena Flake, MD;  Location:  [facility];  Service: Orthopedics;  Laterality: Right;  Diabetic - oral meds  . ESOPHAGOGASTRODUODENOSCOPY (EGD) WITH PROPOFOL N/A 01/31/2015   Procedure: ESOPHAGOGASTRODUODENOSCOPY (EGD) WITH PROPOFOL;  Surgeon: Scot Jun, MD;  Location: [facility];  Service: Endoscopy;  Laterality: N/A;  . esophogastroduodenoscopy    . esophogastroduodeoscopy    . HERNIA REPAIR    . IR NEPHROSTOMY PLACEMENT LEFT  05/22/2018  . NEPHROLITHOTOMY Left 05/22/2018   Procedure: NEPHROLITHOTOMY PERCUTANEOUS;  Surgeon: Vanna Scotland, MD;  Location: [facility];  Service: Urology;  Laterality: Left;  . SAVORY DILATION N/A 01/31/2015   Procedure: SAVORY DILATION;  Surgeon: Scot Jun, MD;  Location: [facility];  Service: Endoscopy;  Laterality: N/A;  . SHOULDER ARTHROSCOPY Right 01/19/2017   Procedure: ARTHROSCOPY SHOULDER DEBRIDEMENT DECOMPRESSION AND  ROTATOR CUFF REPAIR AND BICEPS TENODESIS;  Surgeon: Christena Flake, MD;  Location: [facility];  Service: Orthopedics;  Laterality: Right;    Home Medications:  Allergies as of 06/08/2018   No Known Allergies     Medication List       Accurate as of June 08, 2018 10:11 AM. Always use your most recent med list.        citalopram 20 MG tablet Commonly known as:  CELEXA Take 20 mg by mouth daily.   docusate sodium 100 MG capsule Commonly known as:  COLACE Take 1 capsule (100 mg total) by mouth 2 (two) times daily.   lisinopril 5 MG tablet Commonly known  as:  PRINIVIL,ZESTRIL Take 5 mg by mouth daily.   lovastatin 40 MG tablet Commonly known as:  MEVACOR Take 40 mg by mouth at bedtime.   metFORMIN 500 MG tablet Commonly known as:  GLUCOPHAGE Take 500 mg by mouth daily with breakfast.   naproxen 500 MG tablet Commonly known as:  NAPROSYN Take 1 tablet (500 mg total) by mouth 2 (two) times daily with a meal.   oxybutynin 5 MG tablet Commonly known as:  DITROPAN Take 1 tablet (5 mg total) by mouth every 8 (eight)  hours as needed for bladder spasms.   oxyCODONE 5 MG immediate release tablet Commonly known as:  ROXICODONE Take 1 tablet (5 mg total) by mouth every 8 (eight) hours as needed.   ranitidine 150 MG tablet Commonly known as:  ZANTAC Take 150 mg by mouth at bedtime.   tamsulosin 0.4 MG Caps capsule Commonly known as:  FLOMAX Take 1 capsule (0.4 mg total) by mouth daily.   vitamin B-12 1000 MCG tablet Commonly known as:  CYANOCOBALAMIN Take 2,000 mcg by mouth daily.   Vitamin D3 10 MCG (400 UNIT) Caps Take 400 Units by mouth daily.       Allergies: No Known Allergies  Family History: No family history on file.  Social History:  reports that she quit smoking about 10 years ago. She has never used smokeless tobacco. She reports that she does not drink alcohol or use drugs.  ROS: UROLOGY Frequent Urination?: Yes Hard to postpone urination?: Yes Burning/pain with urination?: No Get up at night to urinate?: No Leakage of urine?: Yes Urine stream starts and stops?: No Trouble starting stream?: No Do you have to strain to urinate?: No Blood in urine?: Yes Urinary tract infection?: No Sexually transmitted disease?: No Injury to kidneys or bladder?: No Painful intercourse?: No Weak stream?: No Currently pregnant?: No Vaginal bleeding?: No Last menstrual period?: n  Gastrointestinal Nausea?: No Vomiting?: No Indigestion/heartburn?: No Diarrhea?: No Constipation?: No  Constitutional Fever: No Night sweats?: No Weight loss?: No Fatigue?: No  Skin Skin rash/lesions?: No Itching?: No  Eyes Blurred vision?: No Double vision?: No  Ears/Nose/Throat Sore throat?: No Sinus problems?: No  Hematologic/Lymphatic Swollen glands?: No Easy bruising?: No  Cardiovascular Leg swelling?: No Chest pain?: No  Respiratory Cough?: No Shortness of breath?: No  Endocrine Excessive thirst?: No  Musculoskeletal Back pain?: No Joint pain?: No  Neurological  Headaches?: No Dizziness?: No  Psychologic Depression?: No Anxiety?: No  Physical Exam: BP (!) 150/84   Pulse 93   Ht 5' 4" (1.626 m)   Wt 181 lb (82.1 kg)   BMI 31.07 kg/m   Constitutional:  Alert and oriented, No acute distress.  Accompanied by husband today. HEENT: [place] AT, moist mucus membranes.  Trachea midline, no masses. Cardiovascular: No clubbing, cyanosis, or edema. Respiratory: Normal respiratory effort, no increased work of breathing. Skin: No rashes, bruises or suspicious lesions. Neurologic: Grossly intact, no focal deficits, moving all 4 extremities. Psychiatric: Normal mood and affect.  Laboratory Data: Lab Results  Component Value Date   WBC 16.7 (H) 05/23/2018   HGB 12.0 05/23/2018   HCT 36.6 05/23/2018   MCV 92.7 05/23/2018   PLT 280 05/23/2018    Lab Results  Component Value Date   CREATININE 0.79 05/23/2018     Urinalysis Pending Pertinent Imaging: Results for orders placed during the hospital encounter of 06/01/18  CT RENAL STONE STUDY   Narrative CLINICAL DATA:  Recent left stent placement with lithotripsy. Dysuria.   Evaluate for residual stones.  EXAM: CT ABDOMEN AND PELVIS WITHOUT CONTRAST  TECHNIQUE: Multidetector CT imaging of the abdomen and pelvis was performed following the standard protocol without IV contrast.  COMPARISON:  03/16/2018  FINDINGS: Lower chest: Clear lung bases. Normal heart size without pericardial or pleural effusion. Small hiatal hernia.  Hepatobiliary: Mild hepatic steatosis. Cholecystectomy, without biliary ductal dilatation.  Pancreas: Normal, without mass or ductal dilatation.  Spleen: Normal in size, without focal abnormality.  Adrenals/Urinary Tract: Normal adrenal glands. 6 mm lower pole right renal collecting system calculus. Interpolar right renal 3.2 cm low-density lesion is likely a cyst.  Placement of a left ureteric stent. Decrease in stone burden with residual stones and stone fragments  in the left collecting system. Improvement in left hydronephrosis with minimal upper pole caliectasis remaining. No hydroureter or ureteric calculi. Small volume blood products within the left posterior pararenal space including on image 26/2.  No bladder calculi.  Stomach/Bowel: Normal remainder of the stomach. Scattered colonic diverticula. Normal terminal ileum and appendix. Normal small bowel.  Vascular/Lymphatic: Aortic and branch vessel atherosclerosis. No abdominopelvic adenopathy.  Reproductive: Normal uterus. Dystrophic calcifications and a residual follicle in the right ovary.  Other: No significant free fluid. Mild pelvic floor laxity. Subcutaneous left pelvic nodule of 8 mm is nonspecific.  Musculoskeletal: Prominent disc bulge at L1-2, presuming a transitional S1 vertebral body  IMPRESSION: 1. Placement of a left ureteric stent with decrease in stone burden. Residual stones and stone fragments in the left collecting system. Improvement in left-sided hydronephrosis. No hydroureter or ureteric stone. Trace perinephric blood products are likely procedure related. 2. Right nephrolithiasis. 3. Hepatic steatosis. 4. Small hiatal hernia.  Aortic Atherosclerosis (ICD10-I70.0).   Electronically Signed   By: Kyle  Talbot M.D.   On: 06/02/2018 07:55    CT scan was personally reviewed today, agree with radiologic interpretation.  Assessment & Plan:    1. Nephrolithiasis Status post left PCNL with residual stone burden, overall dramatically increased  I have recommended returning to the operating room for staged ureteroscopic procedures to treat her overall residual stone burden  Risks and benefits of ureteroscopy were reviewed including but not limited to infection, bleeding, pain, ureteral injury which could require open surgery versus prolonged indwelling if ureteral perforation occurs, persistent stone disease, requirement for staged procedure, possible stent,  and global anesthesia risks. Patient expressed understanding and desires to proceed with ureteroscopy.  Preop urine culture  - Urinalysis, Complete - CULTURE, URINE COMPREHENSIVE   Schedule left ureteroscopy, laser lithotripsy, ureteral stent replacement  Toshie Demelo, MD  [facility] [address], [address] [place], [place] [address] [phone]  

## 2018-06-08 NOTE — H&P (View-Only) (Signed)
06/08/2018 10:11 AM   Sheryl [name] [birth year] [id number]  Referring provider: Marina Goodell, MD [address] [place], [place] [address]  Chief Complaint  Patient presents with  . Nephrolithiasis    Hosptial follow up to discuss options    HPI: 68 year old female with personal history of nephrolithiasis status post left PCNL on 05/22/2018 returns today for routine follow-up.  Intraoperatively, access was difficult and that there was high suspicion of residual stone burden.  An antegrade double-J ureteral stent was placed.  She follows up today with a CT scan which demonstrates significantly decreased overall stone burden on the left with residual stone fragments in the mid and lower poles.  Additional postoperative changes are identified.  She also has a 6 mm right lower pole nonobstructing stone.  Overall, she reports that she is doing well.  She is no longer leaking from her back.  Does have intermittent gross hematuria related to her stent as well as some lower abdominal discomfort specifically with activity likely from the stent as well.  No dysuria, fevers or chills.  No flank pain.  Stone composition 100% calcium oxalate monohydrate.  PMH: Past Medical History:  Diagnosis Date  . Anxiety   . Arthritis    lower back, left hip   . Chicken pox   . Chronic kidney disease   . Diabetes mellitus without complication (HCC)   . GERD (gastroesophageal reflux disease)   . History of hiatal hernia   . History of kidney stones   . Hypercholesterolemia   . Hypertension   . Migraines    rare now  . Nephrolithiasis     Surgical History: Past Surgical History:  Procedure Laterality Date  . BREAST CYST ASPIRATION Left    negative 2012  . CHOLECYSTECTOMY    . COLONOSCOPY    . COLONOSCOPY, ESOPHAGOGASTRODUODENOSCOPY (EGD) AND ESOPHAGEAL DILATION    . DISTAL BICEPS TENDON REPAIR Right 01/19/2017   Procedure: BICEPS TENODESIS;  Surgeon: Christena Flake, MD;  Location:  [facility];  Service: Orthopedics;  Laterality: Right;  Diabetic - oral meds  . ESOPHAGOGASTRODUODENOSCOPY (EGD) WITH PROPOFOL N/A 01/31/2015   Procedure: ESOPHAGOGASTRODUODENOSCOPY (EGD) WITH PROPOFOL;  Surgeon: Scot Jun, MD;  Location: [facility];  Service: Endoscopy;  Laterality: N/A;  . esophogastroduodenoscopy    . esophogastroduodeoscopy    . HERNIA REPAIR    . IR NEPHROSTOMY PLACEMENT LEFT  05/22/2018  . NEPHROLITHOTOMY Left 05/22/2018   Procedure: NEPHROLITHOTOMY PERCUTANEOUS;  Surgeon: Vanna Scotland, MD;  Location: [facility];  Service: Urology;  Laterality: Left;  . SAVORY DILATION N/A 01/31/2015   Procedure: SAVORY DILATION;  Surgeon: Scot Jun, MD;  Location: [facility];  Service: Endoscopy;  Laterality: N/A;  . SHOULDER ARTHROSCOPY Right 01/19/2017   Procedure: ARTHROSCOPY SHOULDER DEBRIDEMENT DECOMPRESSION AND  ROTATOR CUFF REPAIR AND BICEPS TENODESIS;  Surgeon: Christena Flake, MD;  Location: [facility];  Service: Orthopedics;  Laterality: Right;    Home Medications:  Allergies as of 06/08/2018   No Known Allergies     Medication List       Accurate as of June 08, 2018 10:11 AM. Always use your most recent med list.        citalopram 20 MG tablet Commonly known as:  CELEXA Take 20 mg by mouth daily.   docusate sodium 100 MG capsule Commonly known as:  COLACE Take 1 capsule (100 mg total) by mouth 2 (two) times daily.   lisinopril 5 MG tablet Commonly known  as:  PRINIVIL,ZESTRIL Take 5 mg by mouth daily.   lovastatin 40 MG tablet Commonly known as:  MEVACOR Take 40 mg by mouth at bedtime.   metFORMIN 500 MG tablet Commonly known as:  GLUCOPHAGE Take 500 mg by mouth daily with breakfast.   naproxen 500 MG tablet Commonly known as:  NAPROSYN Take 1 tablet (500 mg total) by mouth 2 (two) times daily with a meal.   oxybutynin 5 MG tablet Commonly known as:  DITROPAN Take 1 tablet (5 mg total) by mouth every 8 (eight)  hours as needed for bladder spasms.   oxyCODONE 5 MG immediate release tablet Commonly known as:  ROXICODONE Take 1 tablet (5 mg total) by mouth every 8 (eight) hours as needed.   ranitidine 150 MG tablet Commonly known as:  ZANTAC Take 150 mg by mouth at bedtime.   tamsulosin 0.4 MG Caps capsule Commonly known as:  FLOMAX Take 1 capsule (0.4 mg total) by mouth daily.   vitamin B-12 1000 MCG tablet Commonly known as:  CYANOCOBALAMIN Take 2,000 mcg by mouth daily.   Vitamin D3 10 MCG (400 UNIT) Caps Take 400 Units by mouth daily.       Allergies: No Known Allergies  Family History: No family history on file.  Social History:  reports that she quit smoking about 10 years ago. She has never used smokeless tobacco. She reports that she does not drink alcohol or use drugs.  ROS: UROLOGY Frequent Urination?: Yes Hard to postpone urination?: Yes Burning/pain with urination?: No Get up at night to urinate?: No Leakage of urine?: Yes Urine stream starts and stops?: No Trouble starting stream?: No Do you have to strain to urinate?: No Blood in urine?: Yes Urinary tract infection?: No Sexually transmitted disease?: No Injury to kidneys or bladder?: No Painful intercourse?: No Weak stream?: No Currently pregnant?: No Vaginal bleeding?: No Last menstrual period?: n  Gastrointestinal Nausea?: No Vomiting?: No Indigestion/heartburn?: No Diarrhea?: No Constipation?: No  Constitutional Fever: No Night sweats?: No Weight loss?: No Fatigue?: No  Skin Skin rash/lesions?: No Itching?: No  Eyes Blurred vision?: No Double vision?: No  Ears/Nose/Throat Sore throat?: No Sinus problems?: No  Hematologic/Lymphatic Swollen glands?: No Easy bruising?: No  Cardiovascular Leg swelling?: No Chest pain?: No  Respiratory Cough?: No Shortness of breath?: No  Endocrine Excessive thirst?: No  Musculoskeletal Back pain?: No Joint pain?: No  Neurological  Headaches?: No Dizziness?: No  Psychologic Depression?: No Anxiety?: No  Physical Exam: BP (!) 150/84   Pulse 93   Ht 5\' 4"  (1.626 m)   Wt 181 lb (82.1 kg)   BMI 31.07 kg/m   Constitutional:  Alert and oriented, No acute distress.  Accompanied by husband today. HEENT: [place] AT, moist mucus membranes.  Trachea midline, no masses. Cardiovascular: No clubbing, cyanosis, or edema. Respiratory: Normal respiratory effort, no increased work of breathing. Skin: No rashes, bruises or suspicious lesions. Neurologic: Grossly intact, no focal deficits, moving all 4 extremities. Psychiatric: Normal mood and affect.  Laboratory Data: Lab Results  Component Value Date   WBC 16.7 (H) 05/23/2018   HGB 12.0 05/23/2018   HCT 36.6 05/23/2018   MCV 92.7 05/23/2018   PLT 280 05/23/2018    Lab Results  Component Value Date   CREATININE 0.79 05/23/2018     Urinalysis Pending Pertinent Imaging: Results for orders placed during the hospital encounter of 06/01/18  CT RENAL STONE STUDY   Narrative CLINICAL DATA:  Recent left stent placement with lithotripsy. Dysuria.  Evaluate for residual stones.  EXAM: CT ABDOMEN AND PELVIS WITHOUT CONTRAST  TECHNIQUE: Multidetector CT imaging of the abdomen and pelvis was performed following the standard protocol without IV contrast.  COMPARISON:  03/16/2018  FINDINGS: Lower chest: Clear lung bases. Normal heart size without pericardial or pleural effusion. Small hiatal hernia.  Hepatobiliary: Mild hepatic steatosis. Cholecystectomy, without biliary ductal dilatation.  Pancreas: Normal, without mass or ductal dilatation.  Spleen: Normal in size, without focal abnormality.  Adrenals/Urinary Tract: Normal adrenal glands. 6 mm lower pole right renal collecting system calculus. Interpolar right renal 3.2 cm low-density lesion is likely a cyst.  Placement of a left ureteric stent. Decrease in stone burden with residual stones and stone fragments  in the left collecting system. Improvement in left hydronephrosis with minimal upper pole caliectasis remaining. No hydroureter or ureteric calculi. Small volume blood products within the left posterior pararenal space including on image 26/2.  No bladder calculi.  Stomach/Bowel: Normal remainder of the stomach. Scattered colonic diverticula. Normal terminal ileum and appendix. Normal small bowel.  Vascular/Lymphatic: Aortic and branch vessel atherosclerosis. No abdominopelvic adenopathy.  Reproductive: Normal uterus. Dystrophic calcifications and a residual follicle in the right ovary.  Other: No significant free fluid. Mild pelvic floor laxity. Subcutaneous left pelvic nodule of 8 mm is nonspecific.  Musculoskeletal: Prominent disc bulge at L1-2, presuming a transitional S1 vertebral body  IMPRESSION: 1. Placement of a left ureteric stent with decrease in stone burden. Residual stones and stone fragments in the left collecting system. Improvement in left-sided hydronephrosis. No hydroureter or ureteric stone. Trace perinephric blood products are likely procedure related. 2. Right nephrolithiasis. 3. Hepatic steatosis. 4. Small hiatal hernia.  Aortic Atherosclerosis (ICD10-I70.0).   Electronically Signed   By: Jeronimo Greaves M.D.   On: 06/02/2018 07:55    CT scan was personally reviewed today, agree with radiologic interpretation.  Assessment & Plan:    1. Nephrolithiasis Status post left PCNL with residual stone burden, overall dramatically increased  I have recommended returning to the operating room for staged ureteroscopic procedures to treat her overall residual stone burden  Risks and benefits of ureteroscopy were reviewed including but not limited to infection, bleeding, pain, ureteral injury which could require open surgery versus prolonged indwelling if ureteral perforation occurs, persistent stone disease, requirement for staged procedure, possible stent,  and global anesthesia risks. Patient expressed understanding and desires to proceed with ureteroscopy.  Preop urine culture  - Urinalysis, Complete - CULTURE, URINE COMPREHENSIVE   Schedule left ureteroscopy, laser lithotripsy, ureteral stent replacement  Vanna Scotland, MD  [facility] [address] [place], [place] [address] [phone]

## 2018-06-08 NOTE — Telephone Encounter (Signed)
Patient was given the [facility] Surgery Information form below as well as the Instructions for Pre-Admission Testing form & a map of [facility].   [address], [address] [place], [place] [address] Telephone: [phone] Fax: [phone]   Thank you for choosing [facility] for your upcoming surgery!  We are always here to assist in your urological needs.  Please read the following information with specific details for your upcoming appointments related to your surgery. Please contact Ryleah Miramontes at [phone] Option 3 with any questions.  The Name of Your Surgery: Left ureteroscopy, laser lithotripsy, stone removal, ureteral stent exchange Your Surgery Date: will call with surgery date Your Surgeon: Vanna Scotland  Please call Same Day Surgery at [phone] between the hours of 1pm-3pm one day prior to your surgery. They will inform you of the time to arrive at Same Day Surgery which is located on the second floor of the [facility].   Please refer to the attached letter regarding instructions for Pre-Admission Testing. You will receive a call from the Pre-Admission Testing office regarding your appointment with them.  The Pre-Admission Testing office is located at [address], on the first floor of the [facility] in [address] (office is to the right as you enter through the [employer] of the [employer]). Please have all medications you are currently taking and your insurance card available.   Patient was advised to have nothing to eat or drink after midnight the night prior to surgery except that she may have only water until 2 hours before surgery with nothing to drink within 2 hours of surgery.  The patient states she currently takes no blood thinners. Patient's questions were answered and she expressed understanding of these  instructions.

## 2018-06-09 ENCOUNTER — Other Ambulatory Visit: Payer: Self-pay | Admitting: Radiology

## 2018-06-09 DIAGNOSIS — N200 Calculus of kidney: Secondary | ICD-10-CM

## 2018-06-11 LAB — CULTURE, URINE COMPREHENSIVE

## 2018-06-14 ENCOUNTER — Other Ambulatory Visit: Payer: Self-pay | Admitting: Urology

## 2018-06-20 ENCOUNTER — Other Ambulatory Visit: Payer: Self-pay

## 2018-06-20 ENCOUNTER — Encounter
Admission: RE | Admit: 2018-06-20 | Discharge: 2018-06-20 | Disposition: A | Discharge status: Home / Self Care | Payer: Medicare Other | Source: Ambulatory Visit | Attending: Urology | Admitting: Urology

## 2018-06-20 HISTORY — DX: Other complications of anesthesia, initial encounter: T88.59XA

## 2018-06-20 HISTORY — DX: Nausea with vomiting, unspecified: R11.2

## 2018-06-20 HISTORY — DX: Adverse effect of unspecified anesthetic, initial encounter: T41.45XA

## 2018-06-20 HISTORY — DX: Other specified postprocedural states: Z98.890

## 2018-06-20 NOTE — Patient Instructions (Signed)
Your procedure is scheduled on: 06-26-18  Report to Same Day Surgery 2nd floor medical mall [facility] Entrance-take elevator on left to 2nd floor.  Check in with surgery information desk.) To find out your arrival time please call [phone] between 1PM - 3PM on 06-22-28  Remember: Instructions that are not followed completely may result in serious medical risk, up to and including death, or upon the discretion of your surgeon and anesthesiologist your surgery may need to be rescheduled.    _x___ 1. Do not eat food after midnight the night before your procedure. You may drink WATER up to 2 hours before you are scheduled to arrive at the hospital for your procedure.  Do not drink WATER  within 2 hours of your scheduled arrival to the hospital.  Type 1 and type 2 diabetics should only drink water.   ____Ensure clear carbohydrate drink on the way to the hospital for bariatric patients  ____Ensure clear carbohydrate drink 3 hours before surgery for Dr Rutherford Nail patients if physician instructed.   No gum chewing or hard candies.     __x__ 2. No Alcohol for 24 hours before or after surgery.   __x__3. No Smoking or e-cigarettes for 24 prior to surgery.  Do not use any chewable tobacco products for at least 6 hour prior to surgery   ____  4. Bring all medications with you on the day of surgery if instructed.    __x__ 5. Notify your doctor if there is any change in your medical condition     (cold, fever, infections).    x___6. On the morning of surgery brush your teeth with toothpaste and water.  You may rinse your mouth with mouth wash if you wish.  Do not swallow any toothpaste or mouthwash.   Do not wear jewelry, make-up, hairpins, clips or nail polish.  Do not wear lotions, powders, or perfumes. You may wear deodorant.  Do not shave 48 hours prior to surgery. Men may shave face and neck.  Do not bring valuables to the hospital.    [facility] is not responsible for any belongings  or valuables.               Contacts, dentures or bridgework may not be worn into surgery.  Leave your suitcase in the car. After surgery it may be brought to your room.  For patients admitted to the hospital, discharge time is determined by your treatment team.  _  Patients discharged the day of surgery will not be allowed to drive home.  You will need someone to drive you home and stay with you the night of your procedure.    Please read over the following fact sheets that you were given:   [facility] Preparing for Surgery   _x___ TAKE THE FOLLOWING MEDICATION THE MORNING OF SURGERY WITH A SMALL SIP OF WATER. These include:  1. PEPCID AC  2. FLOMAX  3. CELEXA  4.  5.  6.  ____Fleets enema or Magnesium Citrate as directed.   ____ Use CHG Soap or sage wipes as directed on instruction sheet   ____ Use inhalers on the day of surgery and bring to hospital day of surgery  _X___ Stop Metformin 2 days prior to surgery-LAST DOSE ON Friday MARCH 20TH   ____ Take 1/2 of usual insulin dose the night before surgery and none on the morning surgery.   ____ Follow recommendations from Cardiologist, Pulmonologist or PCP regarding stopping Aspirin, Coumadin, Plavix ,Eliquis, Effient,  or Pradaxa, and Pletal.  X____Stop Anti-inflammatories such as Advil, Aleve, Ibuprofen, Motrin, Naproxen, Naprosyn, Goodies powders or aspirin products NOW-OK to take Tylenol    ____ Stop supplements until after surgery.    ____ Bring C-Pap to the hospital.

## 2018-06-25 MED ORDER — CEFAZOLIN SODIUM-DEXTROSE 2-4 GM/100ML-% IV SOLN
2.0000 g | INTRAVENOUS | Status: AC
  Administered 2018-06-26: 2 g via INTRAVENOUS

## 2018-06-26 ENCOUNTER — Ambulatory Visit
Admission: RE | Admit: 2018-06-26 | Discharge: 2018-06-26 | Disposition: A | Discharge status: Home / Self Care | Payer: Medicare Other | Attending: Urology | Admitting: Urology

## 2018-06-26 ENCOUNTER — Ambulatory Visit: Payer: Medicare Other | Admitting: Anesthesiology

## 2018-06-26 ENCOUNTER — Encounter
Admission: RE | Disposition: A | Discharge status: Home / Self Care | Payer: Self-pay | Source: Home / Self Care | Attending: Urology

## 2018-06-26 DIAGNOSIS — E7800 Pure hypercholesterolemia, unspecified: Secondary | ICD-10-CM | POA: Insufficient documentation

## 2018-06-26 DIAGNOSIS — I129 Hypertensive chronic kidney disease with stage 1 through stage 4 chronic kidney disease, or unspecified chronic kidney disease: Secondary | ICD-10-CM | POA: Insufficient documentation

## 2018-06-26 DIAGNOSIS — E1122 Type 2 diabetes mellitus with diabetic chronic kidney disease: Secondary | ICD-10-CM | POA: Insufficient documentation

## 2018-06-26 DIAGNOSIS — Z7984 Long term (current) use of oral hypoglycemic drugs: Secondary | ICD-10-CM | POA: Diagnosis not present

## 2018-06-26 DIAGNOSIS — Z791 Long term (current) use of non-steroidal anti-inflammatories (NSAID): Secondary | ICD-10-CM | POA: Diagnosis not present

## 2018-06-26 DIAGNOSIS — N200 Calculus of kidney: Secondary | ICD-10-CM | POA: Diagnosis not present

## 2018-06-26 DIAGNOSIS — K219 Gastro-esophageal reflux disease without esophagitis: Secondary | ICD-10-CM | POA: Diagnosis not present

## 2018-06-26 DIAGNOSIS — Z87891 Personal history of nicotine dependence: Secondary | ICD-10-CM | POA: Diagnosis not present

## 2018-06-26 DIAGNOSIS — Z79899 Other long term (current) drug therapy: Secondary | ICD-10-CM | POA: Diagnosis not present

## 2018-06-26 DIAGNOSIS — N189 Chronic kidney disease, unspecified: Secondary | ICD-10-CM | POA: Diagnosis not present

## 2018-06-26 HISTORY — PX: CYSTOSCOPY/URETEROSCOPY/HOLMIUM LASER/STENT PLACEMENT: SHX6546

## 2018-06-26 LAB — GLUCOSE, CAPILLARY
Glucose-Capillary: 123 mg/dL — ABNORMAL HIGH (ref 70–99)
Glucose-Capillary: 149 mg/dL — ABNORMAL HIGH (ref 70–99)

## 2018-06-26 SURGERY — CYSTOSCOPY/URETEROSCOPY/HOLMIUM LASER/STENT PLACEMENT
Anesthesia: General | Site: Ureter | Laterality: Left

## 2018-06-26 MED ORDER — FENTANYL CITRATE (PF) 100 MCG/2ML IJ SOLN
INTRAMUSCULAR | Status: DC | PRN
  Administered 2018-06-26 (×2): 50 ug via INTRAVENOUS

## 2018-06-26 MED ORDER — DEXAMETHASONE SODIUM PHOSPHATE 10 MG/ML IJ SOLN
INTRAMUSCULAR | Status: AC
  Filled 2018-06-26: qty 1

## 2018-06-26 MED ORDER — LIDOCAINE HCL (PF) 2 % IJ SOLN
INTRAMUSCULAR | Status: AC
  Filled 2018-06-26: qty 10

## 2018-06-26 MED ORDER — SUGAMMADEX SODIUM 200 MG/2ML IV SOLN
INTRAVENOUS | Status: AC
  Filled 2018-06-26: qty 2

## 2018-06-26 MED ORDER — CEFAZOLIN SODIUM-DEXTROSE 2-4 GM/100ML-% IV SOLN
INTRAVENOUS | Status: AC
  Filled 2018-06-26: qty 100

## 2018-06-26 MED ORDER — ACETAMINOPHEN 10 MG/ML IV SOLN
INTRAVENOUS | Status: AC
  Filled 2018-06-26: qty 100

## 2018-06-26 MED ORDER — FENTANYL CITRATE (PF) 100 MCG/2ML IJ SOLN: 25.0000 ug | INTRAMUSCULAR | Status: DC | PRN

## 2018-06-26 MED ORDER — OXYCODONE HCL 5 MG/5ML PO SOLN: 5.0000 mg | Freq: Once | ORAL | Status: AC | PRN

## 2018-06-26 MED ORDER — MIDAZOLAM HCL 2 MG/2ML IJ SOLN
INTRAMUSCULAR | Status: AC
  Filled 2018-06-26: qty 2

## 2018-06-26 MED ORDER — OXYBUTYNIN CHLORIDE 5 MG PO TABS
ORAL_TABLET | ORAL | Status: AC
  Administered 2018-06-26: 5 mg
  Filled 2018-06-26: qty 1

## 2018-06-26 MED ORDER — PROMETHAZINE HCL 25 MG/ML IJ SOLN: 12.5000 mg | Freq: Once | INTRAMUSCULAR | Status: DC | PRN

## 2018-06-26 MED ORDER — OXYCODONE HCL 5 MG PO TABS: 5.0000 mg | ORAL_TABLET | Freq: Three times a day (TID) | ORAL | 0 refills | Status: DC | PRN

## 2018-06-26 MED ORDER — ROCURONIUM BROMIDE 50 MG/5ML IV SOLN
INTRAVENOUS | Status: AC
  Filled 2018-06-26: qty 1

## 2018-06-26 MED ORDER — FENTANYL CITRATE (PF) 100 MCG/2ML IJ SOLN
INTRAMUSCULAR | Status: AC
  Filled 2018-06-26: qty 2

## 2018-06-26 MED ORDER — OXYBUTYNIN CHLORIDE 5 MG PO TABS: 5.0000 mg | ORAL_TABLET | Freq: Three times a day (TID) | ORAL | 0 refills | Status: DC | PRN

## 2018-06-26 MED ORDER — CEPHALEXIN 500 MG PO CAPS: 500.0000 mg | ORAL_CAPSULE | Freq: Four times a day (QID) | ORAL | 0 refills | Status: AC

## 2018-06-26 MED ORDER — SUCCINYLCHOLINE CHLORIDE 20 MG/ML IJ SOLN
INTRAMUSCULAR | Status: AC
  Filled 2018-06-26: qty 1

## 2018-06-26 MED ORDER — IOPAMIDOL (ISOVUE-200) INJECTION 41%
INTRAVENOUS | Status: DC | PRN
  Administered 2018-06-26: 30 mL

## 2018-06-26 MED ORDER — MIDAZOLAM HCL 2 MG/2ML IJ SOLN
INTRAMUSCULAR | Status: DC | PRN
  Administered 2018-06-26: 2 mg via INTRAVENOUS

## 2018-06-26 MED ORDER — LIDOCAINE HCL (CARDIAC) PF 100 MG/5ML IV SOSY
PREFILLED_SYRINGE | INTRAVENOUS | Status: DC | PRN
  Administered 2018-06-26: 100 mg via INTRAVENOUS

## 2018-06-26 MED ORDER — PROPOFOL 10 MG/ML IV BOLUS
INTRAVENOUS | Status: AC
  Filled 2018-06-26: qty 20

## 2018-06-26 MED ORDER — ONDANSETRON HCL 4 MG/2ML IJ SOLN
INTRAMUSCULAR | Status: AC
  Filled 2018-06-26: qty 2

## 2018-06-26 MED ORDER — SODIUM CHLORIDE 0.9 % IV SOLN
INTRAVENOUS | Status: DC
  Administered 2018-06-26: 07:00:00 via INTRAVENOUS

## 2018-06-26 MED ORDER — ACETAMINOPHEN 10 MG/ML IV SOLN
INTRAVENOUS | Status: DC | PRN
  Administered 2018-06-26: 1000 mg via INTRAVENOUS

## 2018-06-26 MED ORDER — OXYCODONE HCL 5 MG PO TABS
ORAL_TABLET | ORAL | Status: AC
  Filled 2018-06-26: qty 1

## 2018-06-26 MED ORDER — ROCURONIUM BROMIDE 100 MG/10ML IV SOLN
INTRAVENOUS | Status: DC | PRN
  Administered 2018-06-26: 40 mg via INTRAVENOUS

## 2018-06-26 MED ORDER — OXYCODONE HCL 5 MG PO TABS
5.0000 mg | ORAL_TABLET | Freq: Once | ORAL | Status: AC | PRN
  Administered 2018-06-26: 5 mg via ORAL

## 2018-06-26 MED ORDER — DEXAMETHASONE SODIUM PHOSPHATE 10 MG/ML IJ SOLN
INTRAMUSCULAR | Status: DC | PRN
  Administered 2018-06-26: 10 mg via INTRAVENOUS

## 2018-06-26 MED ORDER — TAMSULOSIN HCL 0.4 MG PO CAPS: 0.4000 mg | ORAL_CAPSULE | Freq: Every day | ORAL | 0 refills | Status: DC

## 2018-06-26 MED ORDER — PROPOFOL 10 MG/ML IV BOLUS
INTRAVENOUS | Status: DC | PRN
  Administered 2018-06-26: 150 mg via INTRAVENOUS

## 2018-06-26 SURGICAL SUPPLY — 29 items
ADHESIVE MASTISOL STRL (MISCELLANEOUS) ×3 IMPLANT
BAG DRAIN CYSTO-URO LG1000N (MISCELLANEOUS) ×3 IMPLANT
BASKET ZERO TIP 1.9FR (BASKET) ×3 IMPLANT
BRUSH SCRUB EZ 1% IODOPHOR (MISCELLANEOUS) ×3 IMPLANT
CATH URETL 5X70 OPEN END (CATHETERS) ×3 IMPLANT
CNTNR SPEC 2.5X3XGRAD LEK (MISCELLANEOUS)
CONT SPEC 4OZ STER OR WHT (MISCELLANEOUS)
CONTAINER SPEC 2.5X3XGRAD LEK (MISCELLANEOUS) IMPLANT
DRAPE UTILITY 15X26 TOWEL STRL (DRAPES) ×3 IMPLANT
DRSG TEGADERM 2-3/8X2-3/4 SM (GAUZE/BANDAGES/DRESSINGS) ×3 IMPLANT
FIBER LASER LITHO 273 (Laser) ×3 IMPLANT
GLOVE BIO SURGEON STRL SZ 6.5 (GLOVE) ×4 IMPLANT
GLOVE BIO SURGEONS STRL SZ 6.5 (GLOVE) ×2
GOWN STRL REUS W/ TWL LRG LVL3 (GOWN DISPOSABLE) ×2 IMPLANT
GOWN STRL REUS W/TWL LRG LVL3 (GOWN DISPOSABLE) ×4
GUIDEWIRE GREEN .038 145CM (MISCELLANEOUS) ×3 IMPLANT
GUIDEWIRE STR DUAL SENSOR (WIRE) ×3 IMPLANT
INFUSOR MANOMETER BAG 3000ML (MISCELLANEOUS) ×3 IMPLANT
INTRODUCER DILATOR DOUBLE (INTRODUCER) ×3 IMPLANT
KIT TURNOVER CYSTO (KITS) ×3 IMPLANT
PACK CYSTO AR (MISCELLANEOUS) ×3 IMPLANT
SET CYSTO W/LG BORE CLAMP LF (SET/KITS/TRAYS/PACK) ×3 IMPLANT
SHEATH URETERAL 12FRX35CM (MISCELLANEOUS) ×3 IMPLANT
SOL .9 NS 3000ML IRR  AL (IV SOLUTION) ×2
SOL .9 NS 3000ML IRR UROMATIC (IV SOLUTION) ×1 IMPLANT
STENT URET 6FRX24 CONTOUR (STENTS) ×3 IMPLANT
STENT URET 6FRX26 CONTOUR (STENTS) IMPLANT
SURGILUBE 2OZ TUBE FLIPTOP (MISCELLANEOUS) ×3 IMPLANT
WATER STERILE IRR 1000ML POUR (IV SOLUTION) ×3 IMPLANT

## 2018-06-26 NOTE — Interval H&P Note (Signed)
History and Physical Interval Note:  06/26/2018 7:45 AM  Sheryl [name]  has presented today for surgery, with the diagnosis of Left renal stones.  The various methods of treatment have been discussed with the patient and family. After consideration of risks, benefits and other options for treatment, the patient has consented to  Procedure(s): CYSTOSCOPY/URETEROSCOPY/HOLMIUM LASER/STENT Exchange (Left) as a surgical intervention.  The patient's history has been reviewed, patient examined, no change in status, stable for surgery.  I have reviewed the patient's chart and labs.  Questions were answered to the patient's satisfaction.    RRR CTAB  Vanna Scotland

## 2018-06-26 NOTE — Discharge Instructions (Signed)
You have a ureteral stent in place.  This is a tube that extends from your kidney to your bladder.  This may cause urinary bleeding, burning with urination, and urinary frequency.  Please call our office or present to the ED if you develop fevers >101 or pain which is not able to be controlled with oral pain medications.  You may be given either Flomax and/ or ditropan to help with bladder spasms and stent pain in addition to pain medications.    You have a ureteral stent taped your left inner thigh.  I like you to keep the stent on Friday.  On Friday morning, you may untaped and pull the stent gently until it is removed in entirety.  If you have any issues, please call our office.  [facility] [address], [address] [place], [place] [address] [phone]  AMBULATORY SURGERY  DISCHARGE INSTRUCTIONS   1) The drugs that you were given will stay in your system until tomorrow so for the next 24 hours you should not:  A) Drive an automobile B) Make any legal decisions C) Drink any alcoholic beverage   2) You may resume regular meals tomorrow.  Today it is better to start with liquids and gradually work up to solid foods.  You may eat anything you prefer, but it is better to start with liquids, then soup and crackers, and gradually work up to solid foods.   3) Please notify your doctor immediately if you have any unusual bleeding, trouble breathing, redness and pain at the surgery site, drainage, fever, or pain not relieved by medication.    4) Additional Instructions:        Please contact your physician with any problems or Same Day Surgery at [phone], Monday through Friday 6 am to 4 pm, or [place] at [facility] number at [phone].

## 2018-06-26 NOTE — Transfer of Care (Signed)
Immediate Anesthesia Transfer of Care Note  Patient: Sheryl [name]  Procedure(s) Performed: CYSTOSCOPY/URETEROSCOPY/HOLMIUM LASER/STENT Exchange (Left Ureter)  Patient Location: PACU  Anesthesia Type:General  Level of Consciousness: drowsy  Airway & Oxygen Therapy: Patient Spontanous Breathing and Patient connected to face mask oxygen  Post-op Assessment: Report given to RN and Post -op Vital signs reviewed and stable  Post vital signs: Reviewed and stable  Last Vitals:  Vitals Value Taken Time  BP 142/68 06/26/2018  9:28 AM  Temp 36 C 06/26/2018  9:28 AM  Pulse 55 06/26/2018  9:31 AM  Resp 23 06/26/2018  9:31 AM  SpO2 100 % 06/26/2018  9:31 AM  Vitals shown include unvalidated device data.  Last Pain: There were no vitals filed for this visit.       Complications: No apparent anesthesia complications

## 2018-06-26 NOTE — Anesthesia Procedure Notes (Signed)
Procedure Name: Intubation Date/Time: 06/26/2018 7:41 AM Performed by: Johnna Acosta, CRNA Pre-anesthesia Checklist: Patient identified, Patient being monitored, Timeout performed, Emergency Drugs available and Suction available Patient Re-evaluated:Patient Re-evaluated prior to induction Oxygen Delivery Method: Circle system utilized Preoxygenation: Pre-oxygenation with 100% oxygen Induction Type: IV induction Ventilation: Mask ventilation without difficulty Laryngoscope Size: Mac, 3 and McGraph Grade View: Grade I Tube type: Oral Tube size: 7.0 mm Number of attempts: 1 Airway Equipment and Method: Stylet Placement Confirmation: ETT inserted through vocal cords under direct vision,  positive ETCO2 and breath sounds checked- equal and bilateral Secured at: 20 cm Tube secured with: Tape Dental Injury: Teeth and Oropharynx as per pre-operative assessment

## 2018-06-26 NOTE — Anesthesia Preprocedure Evaluation (Signed)
Anesthesia Evaluation  Patient identified by MRN, date of birth, ID band Patient awake    Reviewed: Allergy & Precautions, H&P , NPO status , Patient's Chart, lab work & pertinent test results  History of Anesthesia Complications (+) PONV and history of anesthetic complications  Airway Mallampati: III  TM Distance: <3 FB Neck ROM: limited    Dental  (+) Chipped, Missing   Pulmonary neg shortness of breath, former smoker,           Cardiovascular Exercise Tolerance: Good hypertension, (-) angina(-) Past MI and (-) DOE      Neuro/Psych  Headaches, PSYCHIATRIC DISORDERS    GI/Hepatic Neg liver ROS, hiatal hernia, GERD  Medicated and Controlled,  Endo/Other  diabetes, Type 2, Oral Hypoglycemic Agents  Renal/GU Renal disease     Musculoskeletal  (+) Arthritis ,   Abdominal   Peds  Hematology negative hematology ROS (+)   Anesthesia Other Findings Past Medical History: No date: Anxiety No date: Arthritis     Comment:  lower back, left hip  No date: Chicken pox No date: Chronic kidney disease No date: Complication of anesthesia No date: Diabetes mellitus without complication (HCC) No date: GERD (gastroesophageal reflux disease) No date: History of hiatal hernia No date: History of kidney stones No date: Hypercholesterolemia No date: Hypertension No date: Migraines     Comment:  rare now No date: Nephrolithiasis No date: PONV (postoperative nausea and vomiting)     Comment:  VOMITED A LITTLE BIT AFTER KIDNEY STONE SURGERY  Past Surgical History: No date: BREAST CYST ASPIRATION; Left     Comment:  negative 2012 No date: CHOLECYSTECTOMY No date: COLONOSCOPY No date: COLONOSCOPY, ESOPHAGOGASTRODUODENOSCOPY (EGD) AND ESOPHAGEAL  DILATION 01/19/2017: DISTAL BICEPS TENDON REPAIR; Right     Comment:  Procedure: BICEPS TENODESIS;  Surgeon: Christena Flake,               MD;  Location: [facility];  Service:                Orthopedics;  Laterality: Right;  Diabetic - oral meds 01/31/2015: ESOPHAGOGASTRODUODENOSCOPY (EGD) WITH PROPOFOL; N/A     Comment:  Procedure: ESOPHAGOGASTRODUODENOSCOPY (EGD) WITH               PROPOFOL;  Surgeon: Scot Jun, MD;  Location: [facility]              ENDOSCOPY;  Service: Endoscopy;  Laterality: N/A; No date: esophogastroduodenoscopy No date: esophogastroduodeoscopy No date: HERNIA REPAIR 05/22/2018: IR NEPHROSTOMY PLACEMENT LEFT 05/22/2018: NEPHROLITHOTOMY; Left     Comment:  Procedure: NEPHROLITHOTOMY PERCUTANEOUS;  Surgeon:               Vanna Scotland, MD;  Location: [facility];  Service:               Urology;  Laterality: Left; 01/31/2015: SAVORY DILATION; N/A     Comment:  Procedure: SAVORY DILATION;  Surgeon: Scot Jun,               MD;  Location: [facility] ENDOSCOPY;  Service: Endoscopy;                Laterality: N/A; 01/19/2017: SHOULDER ARTHROSCOPY; Right     Comment:  Procedure: ARTHROSCOPY SHOULDER DEBRIDEMENT               DECOMPRESSION AND  ROTATOR CUFF REPAIR AND BICEPS               TENODESIS;  Surgeon: Joice Lofts,  Excell Seltzer, MD;  Location: [facility];  Service: Orthopedics;  Laterality: Right;  BMI    Body Mass Index:  30.90 kg/m      Reproductive/Obstetrics negative OB ROS                             Anesthesia Physical Anesthesia Plan  ASA: III  Anesthesia Plan: General ETT   Post-op Pain Management:    Induction: Intravenous  PONV Risk Score and Plan: Ondansetron, Dexamethasone, Midazolam and Treatment may vary due to age or medical condition  Airway Management Planned: Oral ETT  Additional Equipment:   Intra-op Plan:   Post-operative Plan: Extubation in OR  Informed Consent: I have reviewed the patients History and Physical, chart, labs and discussed the procedure including the risks, benefits and alternatives for the proposed anesthesia with the patient or authorized  representative who has indicated his/her understanding and acceptance.     Dental Advisory Given  Plan Discussed with: Anesthesiologist, CRNA and Surgeon  Anesthesia Plan Comments: (Patient consented for risks of anesthesia including but not limited to:  - adverse reactions to medications - damage to teeth, lips or other oral mucosa - sore throat or hoarseness - Damage to heart, brain, lungs or loss of life  Patient voiced understanding.)        Anesthesia Quick Evaluation

## 2018-06-26 NOTE — Anesthesia Post-op Follow-up Note (Signed)
Anesthesia QCDR form completed.        

## 2018-06-26 NOTE — Anesthesia Postprocedure Evaluation (Signed)
Anesthesia Post Note  Patient: Sheryl [name]  Procedure(s) Performed: CYSTOSCOPY/URETEROSCOPY/HOLMIUM LASER/STENT Exchange (Left Ureter)  Patient location during evaluation: PACU Anesthesia Type: General Level of consciousness: awake and alert Pain management: pain level controlled Vital Signs Assessment: post-procedure vital signs reviewed and stable Respiratory status: spontaneous breathing, nonlabored ventilation, respiratory function stable and patient connected to nasal cannula oxygen Cardiovascular status: blood pressure returned to baseline and stable Postop Assessment: no apparent nausea or vomiting Anesthetic complications: no     Last Vitals:  Vitals:   06/26/18 1013 06/26/18 1028  BP: (!) 153/68 (!) 138/54  Pulse: 81 (!) 53  Resp:    Temp:    SpO2: 93% 95%    Last Pain:  Vitals:   06/26/18 1026  PainSc: 3                  Joseph K Piscitello

## 2018-06-26 NOTE — Op Note (Signed)
Date of procedure: 06/26/18  Preoperative diagnosis:  1. Left kidney stones  Postoperative diagnosis:  1. Same as above  Procedure: 1. Left ureteroscopy 2. Left laser lithotripsy 3. Left retrograde pyelogram 4. Left ureteral stent exchange 5. Basket extraction of stone fragment  Surgeon: Vanna Scotland, MD  Anesthesia: General  Complications: None  Intraoperative findings: Several lower pole stones treated as well as several small ureteral calculi.  Uncomplicated.  Stent exchange with a string on tether.  EBL: Minimal  Specimens: Stone fragment  Drains: 6 x 24 French double-J ureteral stent on left, tether left in place  Indication: Sheryl [name] is a 68 y.o. 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  After reviewing the management options for treatment, she elected to proceed with the above surgical procedure(s). We have discussed the potential benefits and risks of the procedure, side effects of the proposed treatment, the likelihood of the patient achieving the goals of the procedure, and any potential problems that might occur during the procedure or recuperation. Informed consent has been obtained.  Description of procedure:  The patient was taken to the operating room and general anesthesia was induced.  The patient was placed in the dorsal lithotomy position, prepped and draped in the usual sterile fashion, and preoperative antibiotics were administered. A preoperative time-out was performed.   A 21 French scope was advanced per urethra into the bladder.  Attention was turned to the left ureteral orifice from which a ureteral stent was seen emanating.  Of note, the stent itself was fairly encrusted.  It was unable to cannulated due to the level of encrustation.  A cystoscope was readvanced back into the bladder and attention was turned back to the left ureteral orifice where a sensor wire was placed quite easily up to  level the kidney.  A dual-lumen access sheath was used to introduce a Super Stiff wire as a working wire and the safety wire was snapped in place.  A Cook 12/14 French ureteral access sheath was advanced to the proximal ureter.  The inner cannula and Super Stiff wire were withdrawn.  Flexible digital ureteroscope was then advanced up to the level of the upper tract collecting system.  I did encounter one small stone in the renal pelvis.  Initially, had some difficulty navigating into the lower pole but with 8 of retrograde pyelogram which showed no extravasation but persistent mildly dilated collecting system and renal pelvis, I was able to navigate into the extreme lower pole where the stone burden was encountered.  Portion of the stone was able to be fragmented using a 273 m laser fiber using the settings of 0.8 J and 10 Hz.  The remainder was extracted via a 1.9 [place] tipless nitinol basket.  Once the collecting system was adequately cleared, final retrograde pyelogram created a roadmap of the kidney such that each and every calyx could be directly visualized and there is no significant residual stone burden.  There is no contrast extravasation.  The scope was then backed down the length of the ureter.  In the mid/distal ureter, approximately 5 mm stone was encountered which was partially embedded into the wall of the ureter.  I was able to dislodge this and basket it through the partially withdrawn access sheath.  A few smaller fragments were also basketed at the same location.  Scope was then fully withdrawn.  A 6 x 24 French double-J ureteral stent was advanced over the working wire  up to level the kidney.  The wire was partially drawn till focal is noted within the renal pelvis as well as within the bladder.  The stent string was left affixed to the distal coil.  This was secured to the patient's left inner thigh using Mastisol and Tegaderm.  The patient was then clean and dry, repositioned in supine  position, version anesthesia, and taken to the PACU in stable condition.  Plan: Patient remove her own stent on Friday.  She will follow-up in 6 weeks with renal ultrasound prior.  Vanna Scotland, M.D.

## 2018-06-27 ENCOUNTER — Telehealth: Payer: Self-pay | Admitting: Urology

## 2018-06-27 NOTE — Telephone Encounter (Signed)
Pt had surgery yesterday.  She called to let you know that her face and chest were red.  She doesn't have a rash and no other symptoms.  Please advise.

## 2018-06-27 NOTE — Telephone Encounter (Signed)
Advise taking a benadryl 25 mg.  If any swelling of face/ neck, go to ED.  I also sent her home on abx.  If redness does not improve, then stop antibiotics.  Vanna Scotland, MD

## 2018-06-27 NOTE — Telephone Encounter (Signed)
Patient notified and voiced understanding.

## 2018-07-19 ENCOUNTER — Other Ambulatory Visit: Payer: Self-pay | Admitting: Urology

## 2018-07-24 ENCOUNTER — Ambulatory Visit
Admission: RE | Admit: 2018-07-24 | Discharge: 2018-07-24 | Disposition: A | Discharge status: Home / Self Care | Payer: Medicare Other | Source: Ambulatory Visit | Attending: Urology | Admitting: Urology

## 2018-07-24 ENCOUNTER — Other Ambulatory Visit: Payer: Self-pay

## 2018-07-24 DIAGNOSIS — N200 Calculus of kidney: Secondary | ICD-10-CM | POA: Insufficient documentation

## 2018-07-25 ENCOUNTER — Telehealth (INDEPENDENT_AMBULATORY_CARE_PROVIDER_SITE_OTHER): Payer: Medicare Other | Admitting: Urology

## 2018-07-25 DIAGNOSIS — N200 Calculus of kidney: Secondary | ICD-10-CM

## 2018-07-25 NOTE — Patient Instructions (Signed)
Dietary Guidelines to Help Prevent Kidney Stones  Kidney stones are deposits of minerals and salts that form inside your kidneys. Your risk of developing kidney stones may be greater depending on your diet, your lifestyle, the medicines you take, and whether you have certain medical conditions. Most people can reduce their chances of developing kidney stones by following the instructions below. Depending on your overall health and the type of kidney stones you tend to develop, your dietitian may give you more specific instructions.  What are tips for following this plan?  Reading food labels   Choose foods with "no salt added" or "low-salt" labels. Limit your sodium intake to less than 1500 mg per day.   Choose foods with calcium for each meal and snack. Try to eat about 300 mg of calcium at each meal. Foods that contain 200-500 mg of calcium per serving include:  ? 8 oz (237 ml) of milk, fortified nondairy milk, and fortified fruit juice.  ? 8 oz (237 ml) of kefir, yogurt, and soy yogurt.  ? 4 oz (118 ml) of tofu.  ? 1 oz of cheese.  ? 1 cup (300 g) of dried figs.  ? 1 cup (91 g) of cooked broccoli.  ? 1-3 oz can of sardines or mackerel.   Most people need 1000 to 1500 mg of calcium each day. Talk to your dietitian about how much calcium is recommended for you.  Shopping   Buy plenty of fresh fruits and vegetables. Most people do not need to avoid fruits and vegetables, even if they contain nutrients that may contribute to kidney stones.   When shopping for convenience foods, choose:  ? Whole pieces of fruit.  ? Premade salads with dressing on the side.  ? Low-fat fruit and yogurt smoothies.   Avoid buying frozen meals or prepared deli foods.   Look for foods with live cultures, such as yogurt and kefir.  Cooking   Do not add salt to food when cooking. Place a salt shaker on the table and allow each person to add his or her own salt to taste.   Use vegetable protein, such as beans, textured vegetable  protein (TVP), or tofu instead of meat in pasta, casseroles, and soups.  Meal planning     Eat less salt, if told by your dietitian. To do this:  ? Avoid eating processed or premade food.  ? Avoid eating fast food.   Eat less animal protein, including cheese, meat, poultry, or fish, if told by your dietitian. To do this:  ? Limit the number of times you have meat, poultry, fish, or cheese each week. Eat a diet free of meat at least 2 days a week.  ? Eat only one serving each day of meat, poultry, fish, or seafood.  ? When you prepare animal protein, cut pieces into small portion sizes. For most meat and fish, one serving is about the size of one deck of cards.   Eat at least 5 servings of fresh fruits and vegetables each day. To do this:  ? Keep fruits and vegetables on hand for snacks.  ? Eat 1 piece of fruit or a handful of berries with breakfast.  ? Have a salad and fruit at lunch.  ? Have two kinds of vegetables at dinner.   Limit foods that are high in a substance called oxalate. These include:  ? Spinach.  ? Rhubarb.  ? Beets.  ? Potato chips and french fries.  ? Nuts.     If you regularly take a diuretic medicine, make sure to eat at least 1-2 fruits or vegetables high in potassium each day. These include:  ? Avocado.  ? Banana.  ? Orange, prune, carrot, or tomato juice.  ? Baked potato.  ? Cabbage.  ? Beans and split peas.  General instructions     Drink enough fluid to keep your urine clear or pale yellow. This is the most important thing you can do.   Talk to your health care provider and dietitian about taking daily supplements. Depending on your health and the cause of your kidney stones, you may be advised:  ? Not to take supplements with vitamin C.  ? To take a calcium supplement.  ? To take a daily probiotic supplement.  ? To take other supplements such as magnesium, fish oil, or vitamin B6.   Take all medicines and supplements as told by your health care provider.   Limit alcohol intake to no  more than 1 drink a day for nonpregnant women and 2 drinks a day for men. One drink equals 12 oz of beer, 5 oz of wine, or 1 oz of hard liquor.   Lose weight if told by your health care provider. Work with your dietitian to find strategies and an eating plan that works best for you.  What foods are not recommended?  Limit your intake of the following foods, or as told by your dietitian. Talk to your dietitian about specific foods you should avoid based on the type of kidney stones and your overall health.  Grains  Breads. Bagels. Rolls. Baked goods. Salted crackers. Cereal. Pasta.  Vegetables  Spinach. Rhubarb. Beets. Canned vegetables. Pickles. Olives.  Meats and other protein foods  Nuts. Nut butters. Large portions of meat, poultry, or fish. Salted or cured meats. Deli meats. Hot dogs. Sausages.  Dairy  Cheese.  Beverages  Regular soft drinks. Regular vegetable juice.  Seasonings and other foods  Seasoning blends with salt. Salad dressings. Canned soups. Soy sauce. Ketchup. Barbecue sauce. Canned pasta sauce. Casseroles. Pizza. Lasagna. Frozen meals. Potato chips. French fries.  Summary   You can reduce your risk of kidney stones by making changes to your diet.   The most important thing you can do is drink enough fluid. You should drink enough fluid to keep your urine clear or pale yellow.   Ask your health care provider or dietitian how much protein from animal sources you should eat each day, and also how much salt and calcium you should have each day.  This information is not intended to replace advice given to you by your health care provider. Make sure you discuss any questions you have with your health care provider.  Document Released: 07/17/2010 Document Revised: 03/02/2016 Document Reviewed: 03/02/2016  Elsevier Interactive Patient Education  2019 [employer]

## 2018-07-25 NOTE — Progress Notes (Signed)
Virtual Visit via Video Note  I connected with Sheryl [name] on 07/25/18 at 12:00 PM EDT by telephone and verified that I am speaking with the correct person using two identifiers.   I discussed the limitations of evaluation and management by telemedicine and the availability of in person appointments. The patient expressed understanding and agreed to proceed.  History of Present Illness: 68 year old female who presents today via virtual visit for follow-up renal ultrasound following ureteroscopy.  Initially presented with a left partial staghorn, 3.2 cm.  She underwent left PCNL on 05/22/2018 with fairly significant residual stone burden.  She returned to the operating room on 06/26/2026 for staged left ureteroscopy to treat her residual stones.  Her stent was subsequently removed on a tether.  Follow-up renal ultrasound does show resolution of hydronephrosis.  There is some mild debris in the left lower pole, otherwise no significant residual stone burden.  This is a 100% calcium oxalate monohydrate.  Notably today, she does mention that she is had an episode of painless gross hematuria over the past 3 days.  It is unassociated with any dysuria, frequency urgency or flank pain.  She does not feel like she has a urinary tract infection.  No fevers or chills.  She does have an extensive personal history of nephrolithiasis.  She is had several ESWL's as well as ureteroscopy's in the past.  She believes that she had a 24-hour urine dating back to 2013 14 or 17 but cannot remember the results and is currently on no stone prevention medications or regimen.    Observations/Objective: Calm, no distress  Assessment and Plan:   1. Recurrent ephrolithiasis Lengthy discussion today about recurrent stone disease  We discussed general stone prevention techniques including drinking plenty water with goal of producing 2.5 L urine daily, increased citric acid intake, avoidance of high oxalate containing  foods, and decreased salt intake.  Information about dietary recommendations given today.    Recommend repeat 24-hour urine metabolic analysis as she would likely benefit from a more aggressive stone diet/possible stone prevention medication pending results.  She is agreeable this plan.  2. Gross hematuria Several days of painless gross hematuria Concerned about possible UTI/cystitis although otherwise asymptomatic Renal ultrasound just yesterday shows no obvious obstruction thus minimal concern for stone episode We discussed that if her gross hematuria continues, she was advised to call our office in 2 to 3 days and we will treat for presumed UTI as needed  3.  Right lower pole stone, 9 mm In the asymptomatic from right lower pole stone, however given the size, it would likely increase in size and become an issue down the road  In light of COVID-19 situation, would defer any elective surgery but we may consider treating this prophylactically once the threat has resolved  She is agreeable this plan  Follow Up Instructions: F/u in 3 months with 24 hour urine prior   I discussed the assessment and treatment plan with the patient. The patient was provided an opportunity to ask questions and all were answered. The patient agreed with the plan and demonstrated an understanding of the instructions.   The patient was advised to call back or seek an in-person evaluation if the symptoms worsen or if the condition fails to improve as anticipated.  I provided 21 minutes of non-face-to-face time during this encounter.   Vanna Scotland, MD

## 2018-07-26 ENCOUNTER — Other Ambulatory Visit: Payer: Self-pay | Admitting: *Deleted

## 2018-07-26 ENCOUNTER — Ambulatory Visit: Payer: Medicare Other | Admitting: Urology

## 2018-07-26 MED ORDER — TAMSULOSIN HCL 0.4 MG PO CAPS: 0.4000 mg | ORAL_CAPSULE | Freq: Every day | ORAL | 0 refills | Status: DC

## 2018-07-28 ENCOUNTER — Telehealth: Payer: Self-pay | Admitting: Urology

## 2018-07-28 NOTE — Telephone Encounter (Signed)
Patient had a virtual visit with Dr. Apolinar Junes on 07/25/2018.  Patient is calling this morning with complaint of blood in her urine.  She states that Dr. Apolinar Junes requested her to call if the symptoms continued and she would call in an abx to her pharmacy.    She uses the [facility] in [place].  Please advise.

## 2018-07-31 NOTE — Telephone Encounter (Signed)
Please let this patient know that I did not get this message until today. If she still having blood, please call her and Bactrim DS for 5 days.  If her hematuria fails to resolve after this, please let us know.    Vanna Scotland, MD

## 2018-08-01 NOTE — Telephone Encounter (Signed)
Patient denies hematuria for several days and states she doesn't feel she needs antibiotics at this time. She also questions when she should do the 24 hour urine. Advised patient that kit will be mailed to her. 

## 2018-08-01 NOTE — Telephone Encounter (Signed)
[employer] form completed and faxed.

## 2018-08-02 ENCOUNTER — Encounter: Payer: Self-pay | Admitting: *Deleted

## 2018-08-02 NOTE — Progress Notes (Signed)
Mailed instructions for [employer]

## 2018-10-02 ENCOUNTER — Other Ambulatory Visit: Payer: Self-pay

## 2018-10-02 ENCOUNTER — Other Ambulatory Visit: Payer: Medicare Other

## 2018-10-09 ENCOUNTER — Other Ambulatory Visit: Payer: Self-pay | Admitting: Urology

## 2018-10-25 ENCOUNTER — Encounter: Payer: Self-pay | Admitting: Urology

## 2018-10-25 ENCOUNTER — Ambulatory Visit (INDEPENDENT_AMBULATORY_CARE_PROVIDER_SITE_OTHER): Payer: Medicare Other | Admitting: Urology

## 2018-10-25 ENCOUNTER — Other Ambulatory Visit: Payer: Self-pay

## 2018-10-25 VITALS — BP 152/87 | HR 89 | Ht 64.0 in | Wt 180.0 lb

## 2018-10-25 DIAGNOSIS — N200 Calculus of kidney: Secondary | ICD-10-CM | POA: Diagnosis not present

## 2018-10-25 DIAGNOSIS — R310 Gross hematuria: Secondary | ICD-10-CM | POA: Diagnosis not present

## 2018-10-25 LAB — URINALYSIS, COMPLETE
Bilirubin, UA: NEGATIVE
Glucose, UA: NEGATIVE
Ketones, UA: NEGATIVE
Nitrite, UA: NEGATIVE
Protein,UA: NEGATIVE
RBC, UA: NEGATIVE
Specific Gravity, UA: 1.02 (ref 1.005–1.030)
Urobilinogen, Ur: 1 mg/dL (ref 0.2–1.0)
pH, UA: 7 (ref 5.0–7.5)

## 2018-10-25 LAB — MICROSCOPIC EXAMINATION: RBC, Urine: NONE SEEN /hpf (ref 0–2)

## 2018-10-25 LAB — BLADDER SCAN AMB NON-IMAGING: Scan Result: 12

## 2018-10-25 NOTE — Progress Notes (Signed)
10/25/2018 9:45 AM   Isaias Cowmanonna F Lubben 1950-09-12 [id number]  Referring provider: Marina GoodellFeldpausch, Dale E, MD [address] [place],  [place] [id number]  Chief Complaint  Patient presents with  . Nephrolithiasis    HPI: 68 year old female who returns today with follow-up 24-hour urine metabolic evaluation.  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  She underwent left PCNL on 05/22/2018 and staged ureteroscopy on 06/26/2018.  Follow-up imaging shows some mild debris in the left lower pole but otherwise no significant residual stone burden.  Stone analysis showed 100% calcium oxalate monohydrate.  24-hour urine metabolic evaluation shows very low urinary volume of 1.27 L over 24-hour period.  In addition, her urinary oxalate was 38 mg/day, borderline hyperoxaluria.  Her urinary pH was also low, 5.28.  Urinary calcium low, 97 mg/day.  Urinary citrate sufficient 757 mg/day.  Creatinine 0.73.  Serum calcium 9.5.  Magnesium 1.9.  CO2 26.  Today, she also reports that she had an episode of gross hematuria last week that lasted about 4 days.  She had some mild burning associated with this but this has resolved.  No associated flank pain.   PMH: Past Medical History:  Diagnosis Date  . Anxiety   . Arthritis    lower back, left hip   . Chicken pox   . Chronic kidney disease   . Complication of anesthesia   . Diabetes mellitus without complication (HCC)   . GERD (gastroesophageal reflux disease)   . History of hiatal hernia   . History of kidney stones   . Hypercholesterolemia   . Hypertension   . Migraines    rare now  . Nephrolithiasis   . PONV (postoperative nausea and vomiting)    VOMITED A LITTLE BIT AFTER KIDNEY STONE SURGERY    Surgical History: Past Surgical History:  Procedure Laterality Date  . BREAST CYST ASPIRATION Left    negative 2012  . CHOLECYSTECTOMY    . COLONOSCOPY    . COLONOSCOPY, ESOPHAGOGASTRODUODENOSCOPY (EGD) AND ESOPHAGEAL  DILATION    . CYSTOSCOPY/URETEROSCOPY/HOLMIUM LASER/STENT PLACEMENT Left 06/26/2018   Procedure: CYSTOSCOPY/URETEROSCOPY/HOLMIUM LASER/STENT Exchange;  Surgeon: Vanna ScotlandBrandon, Jaquon Gingerich, MD;  Location: [facility];  Service: Urology;  Laterality: Left;  . DISTAL BICEPS TENDON REPAIR Right 01/19/2017   Procedure: BICEPS TENODESIS;  Surgeon: Christena FlakePoggi, John J, MD;  [facility];  Service: Orthopedics;  Laterality: Right;  Diabetic - oral meds  . ESOPHAGOGASTRODUODENOSCOPY (EGD) WITH PROPOFOL N/A 01/31/2015   Procedure: ESOPHAGOGASTRODUODENOSCOPY (EGD) WITH PROPOFOL;  Surgeon: Scot Junobert T Elliott, MD;  [facility];  Service: Endoscopy;  Laterality: N/A;  . esophogastroduodenoscopy    . esophogastroduodeoscopy    . HERNIA REPAIR    . IR NEPHROSTOMY PLACEMENT LEFT  05/22/2018  . NEPHROLITHOTOMY Left 05/22/2018   Procedure: NEPHROLITHOTOMY PERCUTANEOUS;  Surgeon: Vanna ScotlandBrandon, Brynnly Bonet, MD;  Location: [facility];  Service: Urology;  Laterality: Left;  . SAVORY DILATION N/A 01/31/2015   Procedure: SAVORY DILATION;  Surgeon: Scot Junobert T Elliott, MD;  [facility];  Service: Endoscopy;  Laterality: N/A;  . SHOULDER ARTHROSCOPY Right 01/19/2017   Procedure: ARTHROSCOPY SHOULDER DEBRIDEMENT DECOMPRESSION AND  ROTATOR CUFF REPAIR AND BICEPS TENODESIS;  Surgeon: Christena FlakePoggi, John J, MD;  Location: [facility];  Service: Orthopedics;  Laterality: Right;    Home Medications:  Allergies as of 10/25/2018   No Known Allergies     Medication List       Accurate as of October 25, 2018 11:59 PM. If you have  any questions, ask your nurse or doctor.        STOP taking these medications   oxybutynin 5 MG tablet Commonly known as: DITROPAN Stopped by: Vanna ScotlandAshley Setareh Rom, MD   oxyCODONE 5 MG immediate release tablet Commonly known as: Roxicodone Stopped by: Vanna ScotlandAshley Maze Corniel, MD   tamsulosin 0.4 MG Caps capsule Commonly known as: FLOMAX Stopped by: Vanna ScotlandAshley Nagee Goates, MD     TAKE these medications    citalopram 20 MG tablet Commonly known as: CELEXA Take 20 mg by mouth every morning.   lisinopril 5 MG tablet Commonly known as: ZESTRIL Take 5 mg by mouth every morning.   metFORMIN 500 MG tablet Commonly known as: GLUCOPHAGE Take 500 mg by mouth daily with breakfast.   Pepcid AC 10 MG tablet Generic drug: famotidine Take 10 mg by mouth at bedtime.   rosuvastatin 5 MG tablet Commonly known as: CRESTOR Take 5 mg by mouth at bedtime.   Vitamin D3 25 MCG (1000 UT) Caps Take 1,000 Units by mouth daily.       Allergies: No Known Allergies  Family History: No family history on file.  Social History:  reports that she quit smoking about 10 years ago. She has never used smokeless tobacco. She reports that she does not drink alcohol or use drugs.  ROS: UROLOGY Frequent Urination?: Yes Hard to postpone urination?: Yes Burning/pain with urination?: No Get up at night to urinate?: No Leakage of urine?: No Urine stream starts and stops?: No Trouble starting stream?: No Do you have to strain to urinate?: No Blood in urine?: No Urinary tract infection?: No Sexually transmitted disease?: No Injury to kidneys or bladder?: No Painful intercourse?: No Weak stream?: No Currently pregnant?: No Vaginal bleeding?: No Last menstrual period?: n  Gastrointestinal Nausea?: No Vomiting?: No Indigestion/heartburn?: No Diarrhea?: No Constipation?: No  Constitutional Fever: No Night sweats?: No Weight loss?: No Fatigue?: No  Skin Skin rash/lesions?: No Itching?: No  Eyes Blurred vision?: No Double vision?: No  Ears/Nose/Throat Sore throat?: No Sinus problems?: No  Hematologic/Lymphatic Swollen glands?: No Easy bruising?: No  Cardiovascular Leg swelling?: No Chest pain?: No  Respiratory Cough?: No Shortness of breath?: No  Endocrine Excessive thirst?: No  Musculoskeletal Back pain?: No Joint pain?: No  Neurological Headaches?: No Dizziness?: No   Psychologic Anxiety?: No  Physical Exam: BP (!) 152/87   Pulse 89   Ht 5\' 4"  (1.626 m)   Wt 180 lb (81.6 kg)   BMI 30.90 kg/m   Constitutional:  Alert and oriented, No acute distress. HEENT: [place] AT, moist mucus membranes.  Trachea midline, no masses. Cardiovascular: No clubbing, cyanosis, or edema. Respiratory: Normal respiratory effort, no increased work of breathing. Skin: No rashes, bruises or suspicious lesions. Neurologic: Grossly intact, no focal deficits, moving all 4 extremities. Psychiatric: Normal mood and affect.  Laboratory Data: Lab Results  Component Value Date   WBC 16.7 (H) 05/23/2018   HGB 12.0 05/23/2018   HCT 36.6 05/23/2018   MCV 92.7 05/23/2018   PLT 280 05/23/2018    Lab Results  Component Value Date   CREATININE 0.79 05/23/2018   Urinalysis Results for orders placed or performed in visit on 10/25/18  Microscopic Examination   URINE  Result Value Ref Range   WBC, UA 0-5 0 - 5 /hpf   RBC None seen 0 - 2 /hpf   Epithelial Cells (non renal) 0-10 0 - 10 /hpf   Bacteria, UA Few None seen/Few  Urinalysis, Complete  Result Value Ref Range  Specific Gravity, UA 1.020 1.005 - 1.030   pH, UA 7.0 5.0 - 7.5   Color, UA Yellow Yellow   Appearance Ur Clear Clear   Leukocytes,UA 2+ (A) Negative   Protein,UA Negative Negative/Trace   Glucose, UA Negative Negative   Ketones, UA Negative Negative   RBC, UA Negative Negative   Bilirubin, UA Negative Negative   Urobilinogen, Ur 1.0 0.2 - 1.0 mg/dL   Nitrite, UA Negative Negative   Microscopic Examination See below:   Bladder Scan (Post Void Residual) in office  Result Value Ref Range   Scan Result 12     Assessment & Plan:    1. Nephrolithiasis/low urinary volume/hyperoxaluria 84-TXMI urine metabolic analysis ([facility] results) reviewed at length today with patient  Primary defect appears to be fairly low urine output, approximately half of recommended volume.  Strongly encouraged increasing  hydration in the form of water or citrus-based beverage.  In addition to the above, her urinary oxalate is borderline elevated increasing her risk for calcium oxalate stones.  I have encouraged her to review dietary recommendations and pursue a low oxalate diet.  A handout was given.  At this point, no role for pharmacotherapy as her urinary calcium is fairly low and her urinary citrate is on the high end.  To work on the above adjustments.  Plan for KUB in 6 months or sooner as needed. - Bladder Scan (Post Void Residual) in office - DG Abd 1 View; Future - Urinalysis, Complete  2. Gross hematuria Episode of recent gross hematuria, relatively painless which is since resolved  No evidence of UTI today is contributing factor  We discussed that if she has recurrence of this, consider repeat imaging to rule out stone  She will call us and let us know if she has recurrence   Return in about 6 months (around 04/27/2019) for KUB.  Hollice Espy, MD  [facility] [address], [place], [place] [address] [phone]

## 2018-11-16 ENCOUNTER — Other Ambulatory Visit: Payer: Self-pay | Admitting: Family Medicine

## 2018-11-16 ENCOUNTER — Telehealth: Payer: Self-pay | Admitting: Urology

## 2018-11-16 DIAGNOSIS — R310 Gross hematuria: Secondary | ICD-10-CM

## 2018-11-16 DIAGNOSIS — N200 Calculus of kidney: Secondary | ICD-10-CM

## 2018-11-16 NOTE — Telephone Encounter (Signed)
Patient was last seen on 7/22 by Dr. Erlene Quan.  Per her office visit note, patient was to call the office with any recurrent gross hematuria.  Patient called the office today with report of gross hematuria starting on 8/12.  I added her to Dr. Cherrie Gauze schedule tomorrow afternoon and advised her to go to the lab and radiology first and provide a urine sample and get a KUB.  Please place orders for UA & culture and KUB for the [place] location.

## 2018-11-16 NOTE — Telephone Encounter (Signed)
Orders are placed 

## 2018-11-17 ENCOUNTER — Ambulatory Visit (INDEPENDENT_AMBULATORY_CARE_PROVIDER_SITE_OTHER): Payer: Medicare Other | Admitting: Urology

## 2018-11-17 ENCOUNTER — Encounter: Payer: Self-pay | Admitting: Urology

## 2018-11-17 ENCOUNTER — Other Ambulatory Visit: Payer: Self-pay

## 2018-11-17 ENCOUNTER — Ambulatory Visit
Admission: RE | Admit: 2018-11-17 | Discharge: 2018-11-17 | Disposition: A | Discharge status: Home / Self Care | Payer: Medicare Other | Attending: Urology | Admitting: Urology

## 2018-11-17 ENCOUNTER — Ambulatory Visit
Admission: RE | Admit: 2018-11-17 | Discharge: 2018-11-17 | Disposition: A | Discharge status: Home / Self Care | Payer: Medicare Other | Source: Ambulatory Visit | Attending: Urology | Admitting: Urology

## 2018-11-17 ENCOUNTER — Other Ambulatory Visit
Admission: RE | Admit: 2018-11-17 | Discharge: 2018-11-17 | Disposition: A | Discharge status: Home / Self Care | Payer: Medicare Other | Source: Home / Self Care | Attending: Urology | Admitting: Urology

## 2018-11-17 VITALS — BP 130/69 | HR 97 | Ht 64.0 in | Wt 180.0 lb

## 2018-11-17 DIAGNOSIS — N200 Calculus of kidney: Secondary | ICD-10-CM

## 2018-11-17 DIAGNOSIS — R310 Gross hematuria: Secondary | ICD-10-CM

## 2018-11-17 LAB — URINALYSIS, COMPLETE (UACMP) WITH MICROSCOPIC
Bacteria, UA: NONE SEEN
Bilirubin Urine: NEGATIVE
Glucose, UA: NEGATIVE mg/dL
Ketones, ur: NEGATIVE mg/dL
Leukocytes,Ua: NEGATIVE
Nitrite: NEGATIVE
Protein, ur: NEGATIVE mg/dL
Specific Gravity, Urine: 1.01 (ref 1.005–1.030)
WBC, UA: NONE SEEN WBC/hpf (ref 0–5)
pH: 5.5 (ref 5.0–8.0)

## 2018-11-17 NOTE — H&P (View-Only) (Signed)
11/17/2018 3:32 PM   Sheryl [name] April 21, 1950 [id number]  Referring provider: Sofie Hartigan, MD [place],  [place] [address]  Chief Complaint  Patient presents with  . Hematuria    follow up    HPI: 68 year old female who returns today with ongoing painless gross hematuria.  She is had several episodes since her surgery with 1 to 2 days of painless gross hematuria.  She has no other associated symptoms.  She does have some chronic low back pain but this is not lateralized.  Exacerbated with movement she believes it is related to musculoskeletal issues other than her kidneys.  She does have a personal history of nephrolithiasis.  Most recently, she underwent left PCNL followed by staged ureteroscopy with very small stone fragments residual.  She also has a known right-sided nonobstructing stone.  She denies any dysuria, urgency frequency or any ongoing urinary symptoms today.  Previous stone analysis showed 100% calcium oxalate monohydrate.  18-HUDJ urine metabolic evaluation showed very low urinary volume of 1.27 L over 24-hour period.  In addition, her urinary oxalate was 38 mg/day, borderline hyperoxaluria.  Her urinary pH was also low, 5.28.  Urinary calcium low, 97 mg/day.  Urinary citrate sufficient 757 mg/day.  Creatinine 0.73.  Serum calcium 9.5.  Magnesium 1.9.  CO2 26.  KUB today does show some slight enlargement of her right lower pole stone now measuring up to 14 mm, previously 1 cm on CT scan.  No obvious ureteral calculi are seen.  This images personally reviewed today.    PMH: Past Medical History:  Diagnosis Date  . Anxiety   . Arthritis    lower back, left hip   . Chicken pox   . Chronic kidney disease   . Complication of anesthesia   . Diabetes mellitus without complication ([place])   . GERD (gastroesophageal reflux disease)   . History of hiatal hernia   . History of kidney stones   . Hypercholesterolemia   . Hypertension   . Migraines     rare now  . Nephrolithiasis   . PONV (postoperative nausea and vomiting)    VOMITED A LITTLE BIT AFTER KIDNEY STONE SURGERY    Surgical History: Past Surgical History:  Procedure Laterality Date  . BREAST CYST ASPIRATION Left    negative 2012  . CHOLECYSTECTOMY    . COLONOSCOPY    . COLONOSCOPY, ESOPHAGOGASTRODUODENOSCOPY (EGD) AND ESOPHAGEAL DILATION    . CYSTOSCOPY/URETEROSCOPY/HOLMIUM LASER/STENT PLACEMENT Left 06/26/2018   Procedure: CYSTOSCOPY/URETEROSCOPY/HOLMIUM LASER/STENT Exchange;  Surgeon: Hollice Espy, MD;  Location: [facility];  Service: Urology;  Laterality: Left;  . DISTAL BICEPS TENDON REPAIR Right 01/19/2017   Procedure: BICEPS TENODESIS;  Surgeon: Corky Mull, MD;  Location: [place];  Service: Orthopedics;  Laterality: Right;  Diabetic - oral meds  . ESOPHAGOGASTRODUODENOSCOPY (EGD) WITH PROPOFOL N/A 01/31/2015   Procedure: ESOPHAGOGASTRODUODENOSCOPY (EGD) WITH PROPOFOL;  Surgeon: Manya Silvas, MD;  [facility]: [facility];  Service: Endoscopy;  Laterality: N/A;  . esophogastroduodenoscopy    . esophogastroduodeoscopy    . HERNIA REPAIR    . IR NEPHROSTOMY PLACEMENT LEFT  05/22/2018  . NEPHROLITHOTOMY Left 05/22/2018   Procedure: NEPHROLITHOTOMY PERCUTANEOUS;  Surgeon: Hollice Espy, MD;  Location: [facility];  Service: Urology;  Laterality: Left;  . SAVORY DILATION N/A 01/31/2015   Procedure: SAVORY DILATION;  Surgeon: Manya Silvas, MD;  Location: [facility];  Service: Endoscopy;  Laterality: N/A;  . SHOULDER ARTHROSCOPY Right 01/19/2017   Procedure: ARTHROSCOPY SHOULDER  DEBRIDEMENT DECOMPRESSION AND  ROTATOR CUFF REPAIR AND BICEPS TENODESIS;  Surgeon: Christena FlakePoggi, John J, MD;  [facility];  Service: Orthopedics;  Laterality: Right;    Home Medications:  Allergies as of 11/17/2018   No Known Allergies     Medication List       Accurate as of November 17, 2018  3:32 PM. If you have any questions, ask your nurse or doctor.         citalopram 20 MG tablet Commonly known as: CELEXA Take 20 mg by mouth every morning.   lisinopril 5 MG tablet Commonly known as: ZESTRIL Take 5 mg by mouth every morning.   metFORMIN 500 MG tablet Commonly known as: GLUCOPHAGE Take 500 mg by mouth daily with breakfast.   Pepcid AC 10 MG tablet Generic drug: famotidine Take 10 mg by mouth at bedtime.   rosuvastatin 5 MG tablet Commonly known as: CRESTOR Take 5 mg by mouth at bedtime.   Vitamin D3 25 MCG (1000 UT) Caps Take 1,000 Units by mouth daily.       Allergies: No Known Allergies  Family History: History reviewed. No pertinent family history.  Social History:  reports that she quit smoking about 10 years ago. She has never used smokeless tobacco. She reports that she does not drink alcohol or use drugs.  ROS: UROLOGY Frequent Urination?: No Hard to postpone urination?: No Burning/pain with urination?: No Get up at night to urinate?: No Leakage of urine?: Yes Urine stream starts and stops?: No Trouble starting stream?: No Do you have to strain to urinate?: No Blood in urine?: Yes Urinary tract infection?: No Sexually transmitted disease?: No Injury to kidneys or bladder?: No Painful intercourse?: No Weak stream?: No Currently pregnant?: No Vaginal bleeding?: No Last menstrual period?: n  Gastrointestinal Nausea?: No Vomiting?: No Indigestion/heartburn?: No Diarrhea?: No Constipation?: No  Constitutional Fever: No Night sweats?: No Weight loss?: No Fatigue?: No  Skin Skin rash/lesions?: No Itching?: No  Eyes Blurred vision?: No Double vision?: No  Ears/Nose/Throat Sore throat?: No Sinus problems?: No  Hematologic/Lymphatic Swollen glands?: No Easy bruising?: No  Cardiovascular Leg swelling?: No Chest pain?: No  Respiratory Cough?: No Shortness of breath?: No  Endocrine Excessive thirst?: No  Musculoskeletal Back pain?: No Joint pain?: No  Neurological  Headaches?: No Dizziness?: No  Psychologic Depression?: No Anxiety?: No  Physical Exam: BP 130/69   Pulse 97   Ht 5\' 4"  (1.626 m)   Wt 180 lb (81.6 kg)   BMI 30.90 kg/m   Constitutional:  Alert and oriented, No acute distress. HEENT: [place] AT, moist mucus membranes.  Trachea midline, no masses. Cardiovascular: No clubbing, cyanosis, or edema. Respiratory: Normal respiratory effort, no increased work of breathing. GI: Abdomen is soft, nontender, nondistended, no abdominal masses GU: No CVA tenderness Skin: No rashes, bruises or suspicious lesions. Neurologic: Grossly intact, no focal deficits, moving all 4 extremities. Psychiatric: Normal mood and affect.  Urinalysis Results for orders placed or performed during the hospital encounter of 11/17/18  Urinalysis, Complete w Microscopic (For [facility] ONLY)  Result Value Ref Range   Color, Urine YELLOW YELLOW   APPearance CLEAR CLEAR   Specific Gravity, Urine 1.010 1.005 - 1.030   pH 5.5 5.0 - 8.0   Glucose, UA NEGATIVE NEGATIVE mg/dL   Hgb urine dipstick LARGE (A) NEGATIVE   Bilirubin Urine NEGATIVE NEGATIVE   Ketones, ur NEGATIVE NEGATIVE mg/dL   Protein, ur NEGATIVE NEGATIVE mg/dL   Nitrite NEGATIVE NEGATIVE   Leukocytes,Ua  NEGATIVE NEGATIVE   Squamous Epithelial / LPF 0-5 0 - 5   WBC, UA NONE SEEN 0 - 5 WBC/hpf   RBC / HPF 11-20 0 - 5 RBC/hpf   Bacteria, UA NONE SEEN NONE SEEN    Pertinent Imaging: KUB personally reviewed as per above.  This compared to previous CT scans, renal ultrasounds, and KUBs for comparison.  Assessment & Plan:    1. Nephrolithiasis Personal history of bilateral nephrolithiasis  KUB today shows small stone debris in the left kidney as well as an enlarging right lower pole stone now measuring 13 mm.  There is no obvious ureteral calculi seen on the KUB although she does have multiple phleboliths.  She is currently asymptomatic other than for gross hematuria.  Given the interval enlargement of  her right-sided stone as well as need for multiple previous stone procedures, she is interested in undergoing intervention for right nonobstructing stone before continues to enlarge.  She is tolerated ureteroscopy and ESWL in the past and is familiar with both.  She did have issues with stone fragments and ESWL.  Given the density of her stone and history of ESWL complications in the past, strongly recommended right ureteroscopy.  We can also plan to proceed with a left retrograde pyelogram at that time to ensure that there is no other pathology contributing to your episodes of gross hematuria.  Also plan for cystoscopy to evaluate bladder although underlying malignancy is not suspected.  She is agreeable this plan.  Risk of ureteroscopy including risk of bleeding, infection, damage surrounding structures, amongst others were discussed in detail.  Need for further procedures were also discussed and risk of procedure failure.  All questions were answered.  Preop UA/urine culture obtained today.  All questions answered.  She understands the need for COVID testing preop. - Urine culture  2. Gross hematuria As above   Vanna ScotlandAshley Ervie Mccard, MD  [facility] [address], [address] [place], [place] [phone]

## 2018-11-17 NOTE — Addendum Note (Signed)
Addended by: Tommy Rainwater on: 11/17/2018 04:12 PM   Modules accepted: Orders

## 2018-11-17 NOTE — Progress Notes (Signed)
11/17/2018 3:32 PM   Sheryl [name] April 21, 1950 [id number]  Referring provider: Sofie Hartigan, MD [place],  [place] [address]  Chief Complaint  Patient presents with  . Hematuria    follow up    HPI: 68 year old female who returns today with ongoing painless gross hematuria.  She is had several episodes since her surgery with 1 to 2 days of painless gross hematuria.  She has no other associated symptoms.  She does have some chronic low back pain but this is not lateralized.  Exacerbated with movement she believes it is related to musculoskeletal issues other than her kidneys.  She does have a personal history of nephrolithiasis.  Most recently, she underwent left PCNL followed by staged ureteroscopy with very small stone fragments residual.  She also has a known right-sided nonobstructing stone.  She denies any dysuria, urgency frequency or any ongoing urinary symptoms today.  Previous stone analysis showed 100% calcium oxalate monohydrate.  18-HUDJ urine metabolic evaluation showed very low urinary volume of 1.27 L over 24-hour period.  In addition, her urinary oxalate was 38 mg/day, borderline hyperoxaluria.  Her urinary pH was also low, 5.28.  Urinary calcium low, 97 mg/day.  Urinary citrate sufficient 757 mg/day.  Creatinine 0.73.  Serum calcium 9.5.  Magnesium 1.9.  CO2 26.  KUB today does show some slight enlargement of her right lower pole stone now measuring up to 14 mm, previously 1 cm on CT scan.  No obvious ureteral calculi are seen.  This images personally reviewed today.    PMH: Past Medical History:  Diagnosis Date  . Anxiety   . Arthritis    lower back, left hip   . Chicken pox   . Chronic kidney disease   . Complication of anesthesia   . Diabetes mellitus without complication ([place])   . GERD (gastroesophageal reflux disease)   . History of hiatal hernia   . History of kidney stones   . Hypercholesterolemia   . Hypertension   . Migraines     rare now  . Nephrolithiasis   . PONV (postoperative nausea and vomiting)    VOMITED A LITTLE BIT AFTER KIDNEY STONE SURGERY    Surgical History: Past Surgical History:  Procedure Laterality Date  . BREAST CYST ASPIRATION Left    negative 2012  . CHOLECYSTECTOMY    . COLONOSCOPY    . COLONOSCOPY, ESOPHAGOGASTRODUODENOSCOPY (EGD) AND ESOPHAGEAL DILATION    . CYSTOSCOPY/URETEROSCOPY/HOLMIUM LASER/STENT PLACEMENT Left 06/26/2018   Procedure: CYSTOSCOPY/URETEROSCOPY/HOLMIUM LASER/STENT Exchange;  Surgeon: Hollice Espy, MD;  Location: [facility];  Service: Urology;  Laterality: Left;  . DISTAL BICEPS TENDON REPAIR Right 01/19/2017   Procedure: BICEPS TENODESIS;  Surgeon: Corky Mull, MD;  Location: [facility] [facility];  Service: Orthopedics;  Laterality: Right;  Diabetic - oral meds  . ESOPHAGOGASTRODUODENOSCOPY (EGD) WITH PROPOFOL N/A 01/31/2015   Procedure: ESOPHAGOGASTRODUODENOSCOPY (EGD) WITH PROPOFOL;  Surgeon: Manya Silvas, MD;  Location: [facility] ENDOSCOPY;  Service: Endoscopy;  Laterality: N/A;  . esophogastroduodenoscopy    . esophogastroduodeoscopy    . HERNIA REPAIR    . IR NEPHROSTOMY PLACEMENT LEFT  05/22/2018  . NEPHROLITHOTOMY Left 05/22/2018   Procedure: NEPHROLITHOTOMY PERCUTANEOUS;  Surgeon: Hollice Espy, MD;  Location: [facility] ORS;  Service: Urology;  Laterality: Left;  . SAVORY DILATION N/A 01/31/2015   Procedure: SAVORY DILATION;  Surgeon: Manya Silvas, MD;  Location: [facility] ENDOSCOPY;  Service: Endoscopy;  Laterality: N/A;  . SHOULDER ARTHROSCOPY Right 01/19/2017   Procedure: ARTHROSCOPY SHOULDER  DEBRIDEMENT DECOMPRESSION AND  ROTATOR CUFF REPAIR AND BICEPS TENODESIS;  Surgeon: Christena FlakePoggi, John J, MD;  [facility];  Service: Orthopedics;  Laterality: Right;    Home Medications:  Allergies as of 11/17/2018   No Known Allergies     Medication List       Accurate as of November 17, 2018  3:32 PM. If you have any questions, ask your nurse or doctor.         citalopram 20 MG tablet Commonly known as: CELEXA Take 20 mg by mouth every morning.   lisinopril 5 MG tablet Commonly known as: ZESTRIL Take 5 mg by mouth every morning.   metFORMIN 500 MG tablet Commonly known as: GLUCOPHAGE Take 500 mg by mouth daily with breakfast.   Pepcid AC 10 MG tablet Generic drug: famotidine Take 10 mg by mouth at bedtime.   rosuvastatin 5 MG tablet Commonly known as: CRESTOR Take 5 mg by mouth at bedtime.   Vitamin D3 25 MCG (1000 UT) Caps Take 1,000 Units by mouth daily.       Allergies: No Known Allergies  Family History: History reviewed. No pertinent family history.  Social History:  reports that she quit smoking about 10 years ago. She has never used smokeless tobacco. She reports that she does not drink alcohol or use drugs.  ROS: UROLOGY Frequent Urination?: No Hard to postpone urination?: No Burning/pain with urination?: No Get up at night to urinate?: No Leakage of urine?: Yes Urine stream starts and stops?: No Trouble starting stream?: No Do you have to strain to urinate?: No Blood in urine?: Yes Urinary tract infection?: No Sexually transmitted disease?: No Injury to kidneys or bladder?: No Painful intercourse?: No Weak stream?: No Currently pregnant?: No Vaginal bleeding?: No Last menstrual period?: n  Gastrointestinal Nausea?: No Vomiting?: No Indigestion/heartburn?: No Diarrhea?: No Constipation?: No  Constitutional Fever: No Night sweats?: No Weight loss?: No Fatigue?: No  Skin Skin rash/lesions?: No Itching?: No  Eyes Blurred vision?: No Double vision?: No  Ears/Nose/Throat Sore throat?: No Sinus problems?: No  Hematologic/Lymphatic Swollen glands?: No Easy bruising?: No  Cardiovascular Leg swelling?: No Chest pain?: No  Respiratory Cough?: No Shortness of breath?: No  Endocrine Excessive thirst?: No  Musculoskeletal Back pain?: No Joint pain?: No  Neurological  Headaches?: No Dizziness?: No  Psychologic Depression?: No Anxiety?: No  Physical Exam: BP 130/69   Pulse 97   Ht 5\' 4"  (1.626 m)   Wt 180 lb (81.6 kg)   BMI 30.90 kg/m   Constitutional:  Alert and oriented, No acute distress. HEENT: [place] AT, moist mucus membranes.  Trachea midline, no masses. Cardiovascular: No clubbing, cyanosis, or edema. Respiratory: Normal respiratory effort, no increased work of breathing. GI: Abdomen is soft, nontender, nondistended, no abdominal masses GU: No CVA tenderness Skin: No rashes, bruises or suspicious lesions. Neurologic: Grossly intact, no focal deficits, moving all 4 extremities. Psychiatric: Normal mood and affect.  Urinalysis Results for orders placed or performed during the hospital encounter of 11/17/18  Urinalysis, Complete w Microscopic (For [facility] ONLY)  Result Value Ref Range   Color, Urine YELLOW YELLOW   APPearance CLEAR CLEAR   Specific Gravity, Urine 1.010 1.005 - 1.030   pH 5.5 5.0 - 8.0   Glucose, UA NEGATIVE NEGATIVE mg/dL   Hgb urine dipstick LARGE (A) NEGATIVE   Bilirubin Urine NEGATIVE NEGATIVE   Ketones, ur NEGATIVE NEGATIVE mg/dL   Protein, ur NEGATIVE NEGATIVE mg/dL   Nitrite NEGATIVE NEGATIVE   Leukocytes,Ua  NEGATIVE NEGATIVE   Squamous Epithelial / LPF 0-5 0 - 5   WBC, UA NONE SEEN 0 - 5 WBC/hpf   RBC / HPF 11-20 0 - 5 RBC/hpf   Bacteria, UA NONE SEEN NONE SEEN    Pertinent Imaging: KUB personally reviewed as per above.  This compared to previous CT scans, renal ultrasounds, and KUBs for comparison.  Assessment & Plan:    1. Nephrolithiasis Personal history of bilateral nephrolithiasis  KUB today shows small stone debris in the left kidney as well as an enlarging right lower pole stone now measuring 13 mm.  There is no obvious ureteral calculi seen on the KUB although she does have multiple phleboliths.  She is currently asymptomatic other than for gross hematuria.  Given the interval enlargement of  her right-sided stone as well as need for multiple previous stone procedures, she is interested in undergoing intervention for right nonobstructing stone before continues to enlarge.  She is tolerated ureteroscopy and ESWL in the past and is familiar with both.  She did have issues with stone fragments and ESWL.  Given the density of her stone and history of ESWL complications in the past, strongly recommended right ureteroscopy.  We can also plan to proceed with a left retrograde pyelogram at that time to ensure that there is no other pathology contributing to your episodes of gross hematuria.  Also plan for cystoscopy to evaluate bladder although underlying malignancy is not suspected.  She is agreeable this plan.  Risk of ureteroscopy including risk of bleeding, infection, damage surrounding structures, amongst others were discussed in detail.  Need for further procedures were also discussed and risk of procedure failure.  All questions were answered.  Preop UA/urine culture obtained today.  All questions answered.  She understands the need for COVID testing preop. - Urine culture  2. Gross hematuria As above   Mckinzey Entwistle, MD  [facility] [address], [address] , [place] [address] [phone]  

## 2018-11-19 LAB — URINE CULTURE: Culture: 10000 — AB

## 2018-11-24 ENCOUNTER — Other Ambulatory Visit: Payer: Self-pay

## 2018-11-24 ENCOUNTER — Encounter
Admission: RE | Admit: 2018-11-24 | Discharge: 2018-11-24 | Disposition: A | Discharge status: Home / Self Care | Payer: Medicare Other | Source: Ambulatory Visit | Attending: Urology | Admitting: Urology

## 2018-11-24 NOTE — Patient Instructions (Signed)
Your procedure is scheduled on: 12-04-18 MONDAY Report to Same Day Surgery 2nd floor medical mall [facility] Entrance-take elevator on left to 2nd floor.  Check in with surgery information desk.) To find out your arrival time please call [phone] between 1PM - 3PM on 12-01-18 FRIDAY  Remember: Instructions that are not followed completely may result in serious medical risk, up to and including death, or upon the discretion of your surgeon and anesthesiologist your surgery may need to be rescheduled.    _x___ 1. Do not eat food after midnight the night before your procedure. NO GUM OR CANDY AFTER MIDNIGHT. You may drink WATER up to 2 hours before you are scheduled to arrive at the hospital for your procedure.  Do not drink WATER within 2 hours of your scheduled arrival to the hospital.  Type 1 and type 2 diabetics should only drink water.   ____Ensure clear carbohydrate drink on the way to the hospital for bariatric patients  ____Ensure clear carbohydrate drink 3 hours before surgery.      __x__ 2. No Alcohol for 24 hours before or after surgery.   __x__3. No Smoking or e-cigarettes for 24 prior to surgery.  Do not use any chewable tobacco products for at least 6 hour prior to surgery   ____  4. Bring all medications with you on the day of surgery if instructed.    __x__ 5. Notify your doctor if there is any change in your medical condition     (cold, fever, infections).    x___6. On the morning of surgery brush your teeth with toothpaste and water.  You may rinse your mouth with mouth wash if you wish.  Do not swallow any toothpaste or mouthwash.   Do not wear jewelry, make-up, hairpins, clips or nail polish.  Do not wear lotions, powders, or perfumes. You may wear deodorant.  Do not shave 48 hours prior to surgery. Men may shave face and neck.  Do not bring valuables to the hospital.    [facility] is not responsible for any belongings or valuables.               Contacts,  dentures or bridgework may not be worn into surgery.  Leave your suitcase in the car. After surgery it may be brought to your room.  For patients admitted to the hospital, discharge time is determined by your treatment team.  _  Patients discharged the day of surgery will not be allowed to drive home.  You will need someone to drive you home and stay with you the night of your procedure.    Please read over the following fact sheets that you were given:   [facility] Preparing for Surgery   _x___ TAKE THE FOLLOWING MEDICATION THE MORNING OF SURGERY WITH A SMALL SIP OF WATER. These include:  1. CELEXA (CITALOPRAM)  2. PEPCID (FAMOTIDINE)  3. TAKE A PEPCID THE NIGHT BEFORE YOUR SURGERY  4.  5.  6.  ____Fleets enema or Magnesium Citrate as directed.   ____ Use CHG Soap or sage wipes as directed on instruction sheet   ____ Use inhalers on the day of surgery and bring to hospital day of surgery  _X___ Stop Metformin 2 days prior to surgery-LAST DOSE ON Friday, AUGUST 28TH   ____ Take 1/2 of usual insulin dose the night before surgery and none on the morning surgery.   ____ Follow recommendations from Cardiologist, Pulmonologist or PCP regarding  stopping Aspirin, Coumadin, Plavix ,Eliquis,  Effient, or Pradaxa, and Pletal.  X____Stop Anti-inflammatories such as Advil, Aleve, Ibuprofen, Motrin, Naproxen, Naprosyn, Goodies powders or aspirin products NOW-OK to take Tylenol    ____ Stop supplements until after surgery.    ____ Bring C-Pap to the hospital.

## 2018-11-29 ENCOUNTER — Other Ambulatory Visit: Payer: Self-pay

## 2018-11-29 ENCOUNTER — Encounter
Admission: RE | Admit: 2018-11-29 | Discharge: 2018-11-29 | Disposition: A | Discharge status: Home / Self Care | Payer: Medicare Other | Source: Ambulatory Visit | Attending: Urology | Admitting: Urology

## 2018-11-29 DIAGNOSIS — Z01812 Encounter for preprocedural laboratory examination: Secondary | ICD-10-CM | POA: Diagnosis present

## 2018-11-29 DIAGNOSIS — Z20828 Contact with and (suspected) exposure to other viral communicable diseases: Secondary | ICD-10-CM | POA: Diagnosis not present

## 2018-11-30 ENCOUNTER — Other Ambulatory Visit: Payer: Medicare Other

## 2018-11-30 LAB — SARS CORONAVIRUS 2 (TAT 6-24 HRS): SARS Coronavirus 2: NEGATIVE

## 2018-12-04 ENCOUNTER — Ambulatory Visit: Payer: Medicare Other

## 2018-12-04 ENCOUNTER — Ambulatory Visit: Payer: Medicare Other | Admitting: Anesthesiology

## 2018-12-04 ENCOUNTER — Other Ambulatory Visit: Payer: Self-pay

## 2018-12-04 ENCOUNTER — Encounter
Admission: RE | Disposition: A | Discharge status: Home / Self Care | Payer: Self-pay | Source: Home / Self Care | Attending: Urology

## 2018-12-04 ENCOUNTER — Encounter: Payer: Self-pay | Admitting: *Deleted

## 2018-12-04 ENCOUNTER — Ambulatory Visit
Admission: RE | Admit: 2018-12-04 | Discharge: 2018-12-04 | Disposition: A | Discharge status: Home / Self Care | Payer: Medicare Other | Attending: Urology | Admitting: Urology

## 2018-12-04 DIAGNOSIS — Z7984 Long term (current) use of oral hypoglycemic drugs: Secondary | ICD-10-CM | POA: Diagnosis not present

## 2018-12-04 DIAGNOSIS — E1122 Type 2 diabetes mellitus with diabetic chronic kidney disease: Secondary | ICD-10-CM | POA: Insufficient documentation

## 2018-12-04 DIAGNOSIS — Z87891 Personal history of nicotine dependence: Secondary | ICD-10-CM | POA: Insufficient documentation

## 2018-12-04 DIAGNOSIS — E7800 Pure hypercholesterolemia, unspecified: Secondary | ICD-10-CM | POA: Diagnosis not present

## 2018-12-04 DIAGNOSIS — Z79899 Other long term (current) drug therapy: Secondary | ICD-10-CM | POA: Insufficient documentation

## 2018-12-04 DIAGNOSIS — I129 Hypertensive chronic kidney disease with stage 1 through stage 4 chronic kidney disease, or unspecified chronic kidney disease: Secondary | ICD-10-CM | POA: Insufficient documentation

## 2018-12-04 DIAGNOSIS — Z87442 Personal history of urinary calculi: Secondary | ICD-10-CM | POA: Diagnosis not present

## 2018-12-04 DIAGNOSIS — R310 Gross hematuria: Secondary | ICD-10-CM | POA: Diagnosis not present

## 2018-12-04 DIAGNOSIS — F419 Anxiety disorder, unspecified: Secondary | ICD-10-CM | POA: Insufficient documentation

## 2018-12-04 DIAGNOSIS — K219 Gastro-esophageal reflux disease without esophagitis: Secondary | ICD-10-CM | POA: Insufficient documentation

## 2018-12-04 DIAGNOSIS — N189 Chronic kidney disease, unspecified: Secondary | ICD-10-CM | POA: Insufficient documentation

## 2018-12-04 DIAGNOSIS — N200 Calculus of kidney: Secondary | ICD-10-CM | POA: Diagnosis present

## 2018-12-04 HISTORY — PX: CYSTOSCOPY W/ RETROGRADES: SHX1426

## 2018-12-04 HISTORY — PX: CYSTOSCOPY/URETEROSCOPY/HOLMIUM LASER/STENT PLACEMENT: SHX6546

## 2018-12-04 LAB — GLUCOSE, CAPILLARY
Glucose-Capillary: 127 mg/dL — ABNORMAL HIGH (ref 70–99)
Glucose-Capillary: 152 mg/dL — ABNORMAL HIGH (ref 70–99)

## 2018-12-04 SURGERY — CYSTOSCOPY/URETEROSCOPY/HOLMIUM LASER/STENT PLACEMENT
Anesthesia: General | Site: Ureter | Laterality: Right

## 2018-12-04 MED ORDER — FENTANYL CITRATE (PF) 100 MCG/2ML IJ SOLN
INTRAMUSCULAR | Status: AC
  Filled 2018-12-04: qty 2

## 2018-12-04 MED ORDER — HYDROCODONE-ACETAMINOPHEN 5-325 MG PO TABS: 1.0000 | ORAL_TABLET | Freq: Four times a day (QID) | ORAL | 0 refills | Status: DC | PRN

## 2018-12-04 MED ORDER — IOHEXOL 180 MG/ML  SOLN
INTRAMUSCULAR | Status: DC | PRN
  Administered 2018-12-04: 11:00:00 40 mL

## 2018-12-04 MED ORDER — CEFAZOLIN SODIUM-DEXTROSE 2-4 GM/100ML-% IV SOLN
2.0000 g | Freq: Once | INTRAVENOUS | Status: AC
  Administered 2018-12-04: 2 g via INTRAVENOUS

## 2018-12-04 MED ORDER — ATROPINE SULFATE 0.4 MG/ML IJ SOLN
INTRAMUSCULAR | Status: DC | PRN
  Administered 2018-12-04: 0.2 mg via INTRAVENOUS

## 2018-12-04 MED ORDER — METOCLOPRAMIDE HCL 5 MG/ML IJ SOLN
10.0000 mg | Freq: Once | INTRAMUSCULAR | Status: AC
  Administered 2018-12-04: 13:00:00 10 mg via INTRAVENOUS

## 2018-12-04 MED ORDER — FENTANYL CITRATE (PF) 100 MCG/2ML IJ SOLN: 25.0000 ug | INTRAMUSCULAR | Status: DC | PRN

## 2018-12-04 MED ORDER — SODIUM CHLORIDE 0.9 % IV SOLN
INTRAVENOUS | Status: DC
  Administered 2018-12-04: 10:00:00 via INTRAVENOUS

## 2018-12-04 MED ORDER — TAMSULOSIN HCL 0.4 MG PO CAPS: 0.4000 mg | ORAL_CAPSULE | Freq: Every day | ORAL | 0 refills | Status: DC

## 2018-12-04 MED ORDER — DEXAMETHASONE SODIUM PHOSPHATE 10 MG/ML IJ SOLN
INTRAMUSCULAR | Status: DC | PRN
  Administered 2018-12-04: 10 mg via INTRAVENOUS

## 2018-12-04 MED ORDER — SUGAMMADEX SODIUM 200 MG/2ML IV SOLN
INTRAVENOUS | Status: DC | PRN
  Administered 2018-12-04: 200 mg via INTRAVENOUS

## 2018-12-04 MED ORDER — OXYBUTYNIN CHLORIDE 5 MG PO TABS: 5.0000 mg | ORAL_TABLET | Freq: Three times a day (TID) | ORAL | 0 refills | Status: DC | PRN

## 2018-12-04 MED ORDER — LIDOCAINE HCL (CARDIAC) PF 100 MG/5ML IV SOSY
PREFILLED_SYRINGE | INTRAVENOUS | Status: DC | PRN
  Administered 2018-12-04: 100 mg via INTRAVENOUS

## 2018-12-04 MED ORDER — PROPOFOL 10 MG/ML IV BOLUS
INTRAVENOUS | Status: DC | PRN
  Administered 2018-12-04: 130 mg via INTRAVENOUS

## 2018-12-04 MED ORDER — OXYBUTYNIN CHLORIDE 5 MG PO TABS
5.0000 mg | ORAL_TABLET | Freq: Once | ORAL | Status: AC
  Administered 2018-12-04: 14:00:00 5 mg via ORAL
  Filled 2018-12-04: qty 1

## 2018-12-04 MED ORDER — FENTANYL CITRATE (PF) 100 MCG/2ML IJ SOLN
INTRAMUSCULAR | Status: DC | PRN
  Administered 2018-12-04 (×2): 50 ug via INTRAVENOUS

## 2018-12-04 MED ORDER — MIDAZOLAM HCL 2 MG/2ML IJ SOLN
INTRAMUSCULAR | Status: AC
  Filled 2018-12-04: qty 2

## 2018-12-04 MED ORDER — ONDANSETRON HCL 4 MG/2ML IJ SOLN: 4.0000 mg | Freq: Once | INTRAMUSCULAR | Status: DC | PRN

## 2018-12-04 MED ORDER — METOCLOPRAMIDE HCL 5 MG/ML IJ SOLN
INTRAMUSCULAR | Status: AC
  Filled 2018-12-04: qty 2

## 2018-12-04 MED ORDER — PROPOFOL 10 MG/ML IV BOLUS
INTRAVENOUS | Status: AC
  Filled 2018-12-04: qty 20

## 2018-12-04 MED ORDER — ONDANSETRON HCL 4 MG/2ML IJ SOLN
4.0000 mg | Freq: Once | INTRAMUSCULAR | Status: AC
  Administered 2018-12-04: 4 mg via INTRAVENOUS

## 2018-12-04 MED ORDER — ROCURONIUM BROMIDE 100 MG/10ML IV SOLN
INTRAVENOUS | Status: DC | PRN
  Administered 2018-12-04: 10 mg via INTRAVENOUS
  Administered 2018-12-04: 40 mg via INTRAVENOUS

## 2018-12-04 MED ORDER — MIDAZOLAM HCL 2 MG/2ML IJ SOLN
INTRAMUSCULAR | Status: DC | PRN
  Administered 2018-12-04: 2 mg via INTRAVENOUS

## 2018-12-04 MED ORDER — OXYBUTYNIN CHLORIDE 5 MG PO TABS
ORAL_TABLET | ORAL | Status: AC
  Administered 2018-12-04: 5 mg via ORAL
  Filled 2018-12-04: qty 1

## 2018-12-04 MED ORDER — SUCCINYLCHOLINE CHLORIDE 20 MG/ML IJ SOLN
INTRAMUSCULAR | Status: DC | PRN
  Administered 2018-12-04: 100 mg via INTRAVENOUS

## 2018-12-04 MED ORDER — ONDANSETRON HCL 4 MG/2ML IJ SOLN
INTRAMUSCULAR | Status: DC | PRN
  Administered 2018-12-04: 4 mg via INTRAVENOUS

## 2018-12-04 MED ORDER — ONDANSETRON HCL 4 MG/2ML IJ SOLN
INTRAMUSCULAR | Status: AC
  Filled 2018-12-04: qty 2

## 2018-12-04 SURGICAL SUPPLY — 30 items
BAG DRAIN CYSTO-URO LG1000N (MISCELLANEOUS) ×4 IMPLANT
BASKET ZERO TIP 1.9FR (BASKET) ×2 IMPLANT
BRUSH SCRUB EZ  4% CHG (MISCELLANEOUS) ×2
BRUSH SCRUB EZ 1% IODOPHOR (MISCELLANEOUS) ×4 IMPLANT
BRUSH SCRUB EZ 4% CHG (MISCELLANEOUS) ×2 IMPLANT
CATH URETL 5X70 OPEN END (CATHETERS) ×4 IMPLANT
CNTNR SPEC 2.5X3XGRAD LEK (MISCELLANEOUS)
CONT SPEC 4OZ STER OR WHT (MISCELLANEOUS)
CONTAINER SPEC 2.5X3XGRAD LEK (MISCELLANEOUS) IMPLANT
DRAPE UTILITY 15X26 TOWEL STRL (DRAPES) ×4 IMPLANT
FIBER LASER FLEXIVA 365 (UROLOGICAL SUPPLIES) ×2 IMPLANT
FIBER LASER TRAC TIP (UROLOGICAL SUPPLIES) IMPLANT
GLOVE BIO SURGEON STRL SZ 6.5 (GLOVE) ×3 IMPLANT
GLOVE BIO SURGEONS STRL SZ 6.5 (GLOVE) ×1
GOWN STRL REUS W/ TWL LRG LVL3 (GOWN DISPOSABLE) ×4 IMPLANT
GOWN STRL REUS W/TWL LRG LVL3 (GOWN DISPOSABLE) ×4
GUIDEWIRE GREEN .038 145CM (MISCELLANEOUS) ×2 IMPLANT
GUIDEWIRE STR DUAL SENSOR (WIRE) ×4 IMPLANT
INFUSOR MANOMETER BAG 3000ML (MISCELLANEOUS) ×4 IMPLANT
INTRODUCER DILATOR DOUBLE (INTRODUCER) ×2 IMPLANT
KIT TURNOVER CYSTO (KITS) ×4 IMPLANT
PACK CYSTO AR (MISCELLANEOUS) ×4 IMPLANT
SET CYSTO W/LG BORE CLAMP LF (SET/KITS/TRAYS/PACK) ×4 IMPLANT
SHEATH URETERAL 12FRX35CM (MISCELLANEOUS) ×2 IMPLANT
SOL .9 NS 3000ML IRR  AL (IV SOLUTION) ×2
SOL .9 NS 3000ML IRR UROMATIC (IV SOLUTION) ×2 IMPLANT
STENT URET 6FRX24 CONTOUR (STENTS) ×2 IMPLANT
STENT URET 6FRX26 CONTOUR (STENTS) IMPLANT
SURGILUBE 2OZ TUBE FLIPTOP (MISCELLANEOUS) ×4 IMPLANT
WATER STERILE IRR 1000ML POUR (IV SOLUTION) ×4 IMPLANT

## 2018-12-04 NOTE — Anesthesia Post-op Follow-up Note (Signed)
Anesthesia QCDR form completed.        

## 2018-12-04 NOTE — Op Note (Signed)
Date of procedure: 12/04/18  Preoperative diagnosis:  1. Right kidney stone 2. Gross hematuria  Postoperative diagnosis:  1. Same as above  Procedure: 1. Cystoscopy 2. Bilateral retrograde pyelogram 3. Right ureteroscopy with laser lithotripsy 4. Basket extraction of kidney stone fragments 5. Right ureteral stent placement  Surgeon: Hollice Espy, MD  Anesthesia: General  Complications: None  Intraoperative findings: Large right lower pole stone obliterated.  All fragments removed.  Otherwise unremarkable bilateral retrograde pyelogram.  No bladder pathology.  EBL: Minimal  Specimens: None  Drains: 6 x 24 French double-J ureteral stent on right, tether left in place  Indication: Sheryl [name] is a 68 y.o. patient with history of recurrent nephrolithiasis that episode of gross hematuria and enlarging right lower pole stone.  After reviewing the management options for treatment, she elected to proceed with the above surgical procedure(s). We have discussed the potential benefits and risks of the procedure, side effects of the proposed treatment, the likelihood of the patient achieving the goals of the procedure, and any potential problems that might occur during the procedure or recuperation. Informed consent has been obtained.  Description of procedure:  The patient was taken to the operating room and general anesthesia was induced.  The patient was placed in the dorsal lithotomy position, prepped and draped in the usual sterile fashion, and preoperative antibiotics were administered. A preoperative time-out was performed.   21 [place] scope was advanced per urethra into the bladder.  The bladder is carefully inspected and noted to be free of any tumors, lesions, ulcerations, or any other pathologic findings.  Attention was first into the left ureter orifice which was cannulated using a 5 [place] open-ended ureteral catheter.  A gentle retrograde pyelogram on this side revealed no  filling defects or ureter nephrosis.  Attention was then turned to the right ureteral orifice in the same exact procedure was performed.  This did reveal a small filling defect in the right lower pole consistent with known stone.  There were no hydronephrosis extravasation or any other pathologic findings on the side.  The wires in place up to level of the kidney.  A dual-lumen access sheath was used to introduce a Super Stiff wire which was used as a working wire.  The safety wire snapped in place.  A Cook 03/1434 cm access sheath was then advanced under fluoroscopic guidance into the proximal ureter.  The inner cannula and Super Stiff wire were removed.  A dual-lumen flexible digital ureteroscope was then advanced into the kidney.  Initially, had some difficulty identifying the stone but uneventfully was able to get into the right lower pole.  Given the angulation, this was somewhat difficult.  As such, I used a basket to maneuver the stone and reposition it up into an upper pole calyx.  A 365 m laser fiber was then brought in and using settings of 0 point joules and 40 Hz, the stone was fragmented into innumerable smaller pieces.  The majority of these pieces was then removed using a 1.9 [place] tipless nitinol basket.  The remainder of the pieces were dusted into fine particles.  Finally, a final retrograde pyelogram was performed to ensure that each every calyx had been directly visualized.  The scope was then backed down the length of the ureter inspecting long way.  There were no ureteral injuries appreciated and no additional stone fragments.  Finally, a 6 x 24 French double-J ureteral stent was then advanced over the safety wire up to level the renal  pelvis.  The wires partially drawn till full coils noted within the renal pelvis and a full coil noted within the bladder once the entire wire was removed.  The stent string was left affixed to the distal coil of the stent.  This was secured to the patient's  left inner thigh using Mastisol and Tegaderm.  She was then clean and dry, repositioned supine position, reversed myesthesia taken the PACU in stable condition.  Plan: Patient will remove her own stent on Friday.  She will follow-up in 4 weeks with renal ultrasound prior.  We will consider whether or not to pursue 24 urine metabolic evaluation or additional stone prophylaxis thereafter.  All questions were answered.  Sheryl ScotlandAshley Jud Fanguy, M.D.

## 2018-12-04 NOTE — Anesthesia Procedure Notes (Addendum)
Procedure Name: Intubation Date/Time: 12/04/2018 10:39 AM Performed by: Nelda Marseille, CRNA Pre-anesthesia Checklist: Patient identified, Patient being monitored, Timeout performed, Emergency Drugs available and Suction available Patient Re-evaluated:Patient Re-evaluated prior to induction Oxygen Delivery Method: Circle system utilized Preoxygenation: Pre-oxygenation with 100% oxygen Induction Type: IV induction Ventilation: Mask ventilation without difficulty Laryngoscope Size: Mac, 3 and McGraph Grade View: Grade I Tube type: Oral Tube size: 7.0 mm Number of attempts: 1 Airway Equipment and Method: Stylet Placement Confirmation: ETT inserted through vocal cords under direct vision,  positive ETCO2 and breath sounds checked- equal and bilateral Secured at: 21 cm Tube secured with: Tape Dental Injury: Teeth and Oropharynx as per pre-operative assessment  Difficulty Due To: Difficulty was anticipated

## 2018-12-04 NOTE — Anesthesia Preprocedure Evaluation (Signed)
Anesthesia Evaluation  Patient identified by MRN, date of birth, ID band  Reviewed: Allergy & Precautions, NPO status , Patient's Chart, lab work & pertinent test results  History of Anesthesia Complications Negative for: history of anesthetic complications  Airway Mallampati: III       Dental   Pulmonary neg sleep apnea, neg COPD, Not current smoker, former smoker,           Cardiovascular hypertension, Pt. on medications (-) Past MI and (-) CHF (-) dysrhythmias (-) Valvular Problems/Murmurs     Neuro/Psych neg Seizures Anxiety    GI/Hepatic Neg liver ROS, hiatal hernia, GERD  Medicated,  Endo/Other  diabetes, Type 2, Oral Hypoglycemic Agents  Renal/GU Renal disease (stones)     Musculoskeletal   Abdominal   Peds  Hematology   Anesthesia Other Findings   Reproductive/Obstetrics                             Anesthesia Physical Anesthesia Plan  ASA: III  Anesthesia Plan: General   Post-op Pain Management:    Induction:   PONV Risk Score and Plan: 3 and Dexamethasone, Ondansetron and Midazolam  Airway Management Planned: Oral ETT  Additional Equipment:   Intra-op Plan:   Post-operative Plan:   Informed Consent: I have reviewed the patients History and Physical, chart, labs and discussed the procedure including the risks, benefits and alternatives for the proposed anesthesia with the patient or authorized representative who has indicated his/her understanding and acceptance.       Plan Discussed with:   Anesthesia Plan Comments:         Anesthesia Quick Evaluation

## 2018-12-04 NOTE — Transfer of Care (Signed)
Immediate Anesthesia Transfer of Care Note  Patient: Sheryl [name]  Procedure(s) Performed: CYSTOSCOPY/URETEROSCOPY/HOLMIUM LASER/STENT PLACEMENT (Right Ureter) CYSTOSCOPY WITH RETROGRADE PYELOGRAM (Bilateral Ureter)  Patient Location: PACU  Anesthesia Type:General  Level of Consciousness: sedated  Airway & Oxygen Therapy: Patient Spontanous Breathing and Patient connected to face mask oxygen  Post-op Assessment: Report given to RN and Post -op Vital signs reviewed and stable  Post vital signs: Reviewed  Last Vitals:  Vitals Value Taken Time  BP 132/67 12/04/18 1144  Temp 36.1 C 12/04/18 1142  Pulse 76 12/04/18 1144  Resp 16 12/04/18 1144  SpO2 100 % 12/04/18 1144  Vitals shown include unvalidated device data.  Last Pain:  Vitals:   12/04/18 1008  TempSrc:   PainSc: 0-No pain         Complications: No apparent anesthesia complications

## 2018-12-04 NOTE — Interval H&P Note (Signed)
History and Physical Interval Note:  12/04/2018 10:12 AM  Sheryl [name]  has presented today for surgery, with the diagnosis of Nephrolithiasis, gross hematuria.  The various methods of treatment have been discussed with the patient and family. After consideration of risks, benefits and other options for treatment, the patient has consented to  Procedure(s): CYSTOSCOPY/URETEROSCOPY/HOLMIUM LASER/STENT PLACEMENT (Right) CYSTOSCOPY WITH RETROGRADE TRIGGER  PYELOGRAM (Bilateral) as a surgical intervention.  The patient's history has been reviewed, patient examined, no change in status, stable for surgery.  I have reviewed the patient's chart and labs.  Questions were answered to the patient's satisfaction.    RRR CTAB  Hollice Espy

## 2018-12-04 NOTE — Discharge Instructions (Signed)
You have a ureteral stent in place.  This is a tube that extends from your kidney to your bladder.  This may cause urinary bleeding, burning with urination, and urinary frequency.  Please call our office or present to the ED if you develop fevers >101 or pain which is not able to be controlled with oral pain medications.  You may be given either Flomax and/ or ditropan to help with bladder spasms and stent pain in addition to pain medications.    Your stent is on a string taped to her inner thigh.  On Friday morning, you may untape and pull this string gently until the entire stent is removed.  Give any difficulties, please give our office a call would be happy to [place] [address] [place], [place] [address] [phone]  AMBULATORY SURGERY  DISCHARGE INSTRUCTIONS   1) The drugs that you were given will stay in your system until tomorrow so for the next 24 hours you should not:  A) Drive an automobile B) Make any legal decisions C) Drink any alcoholic beverage   2) You may resume regular meals tomorrow.  Today it is better to start with liquids and gradually work up to solid foods.  You may eat anything you prefer, but it is better to start with liquids, then soup and crackers, and gradually work up to solid foods.   3) Please notify your doctor immediately if you have any unusual bleeding, trouble breathing, redness and pain at the surgery site, drainage, fever, or pain not relieved by medication.    4) Additional Instructions:        Please contact your physician with any problems or Same Day Surgery at [phone], Monday through Friday 6 am to 4 pm, or [place] at [facility] number at [phone].

## 2018-12-04 NOTE — Anesthesia Procedure Notes (Deleted)
Performed by: Leialoha Hanna, CRNA       

## 2018-12-04 NOTE — Anesthesia Postprocedure Evaluation (Signed)
Anesthesia Post Note  Patient: Sheryl [name]  Procedure(s) Performed: CYSTOSCOPY/URETEROSCOPY/HOLMIUM LASER/STENT PLACEMENT (Right Ureter) CYSTOSCOPY WITH RETROGRADE PYELOGRAM (Bilateral Ureter)  Patient location during evaluation: PACU Anesthesia Type: General Level of consciousness: awake and alert Pain management: pain level controlled Vital Signs Assessment: post-procedure vital signs reviewed and stable Respiratory status: spontaneous breathing and respiratory function stable Cardiovascular status: blood pressure returned to baseline and stable Anesthetic complications: yes Anesthetic complication details: PONV    Last Vitals:  Vitals:   12/04/18 1258 12/04/18 1328  BP: 126/62 (!) 121/53  Pulse: 60 (!) 41  Resp: (!) 22 (!) 21  Temp:    SpO2: 94% (!) 89%    Last Pain:  Vitals:   12/04/18 1258  TempSrc:   PainSc: 0-No pain                 Roslyn Else K

## 2018-12-04 NOTE — OR Nursing (Signed)
Dr. Ronelle Nigh called and notified that patient's heart rate is in the 40s, she is still nauseated and he order Reglan 10 mg IV once.

## 2018-12-08 ENCOUNTER — Other Ambulatory Visit: Payer: Self-pay | Admitting: Physician Assistant

## 2018-12-08 MED ORDER — ONDANSETRON HCL 4 MG PO TABS: 4.0000 mg | ORAL_TABLET | Freq: Three times a day (TID) | ORAL | 0 refills | Status: AC | PRN

## 2018-12-08 NOTE — Progress Notes (Signed)
Patient called this morning reporting worsened right flank pain, nausea, and vomiting.  She took her stent out this morning at approximately 7 AM.  She says that the pain is intermittent and feels like a spasm.  I suspect her symptoms are secondary to ureteral spasm in the setting having removed her stent.  She last took her prescribed oxybutynin this morning.  She has not taken tamsulosin since yesterday.  I advised her to take that at this time.  I sent a prescription for Zofran 4 mg to her pharmacy to ease her nausea.  I advised her to call back if she develops a fever or chills.  She denies these at this time.

## 2018-12-09 ENCOUNTER — Encounter: Payer: Self-pay | Admitting: Emergency Medicine

## 2018-12-09 ENCOUNTER — Emergency Department
Admission: EM | Admit: 2018-12-09 | Discharge: 2018-12-09 | Discharge status: Against Medical Advice | Payer: Medicare Other | Source: Home / Self Care

## 2018-12-09 ENCOUNTER — Emergency Department: Payer: Medicare Other

## 2018-12-09 ENCOUNTER — Emergency Department
Admission: EM | Admit: 2018-12-09 | Discharge: 2018-12-09 | Disposition: A | Discharge status: Home / Self Care | Payer: Medicare Other | Attending: Emergency Medicine | Admitting: Emergency Medicine

## 2018-12-09 ENCOUNTER — Other Ambulatory Visit: Payer: Self-pay

## 2018-12-09 DIAGNOSIS — R1031 Right lower quadrant pain: Secondary | ICD-10-CM | POA: Insufficient documentation

## 2018-12-09 DIAGNOSIS — E1122 Type 2 diabetes mellitus with diabetic chronic kidney disease: Secondary | ICD-10-CM | POA: Diagnosis not present

## 2018-12-09 DIAGNOSIS — I129 Hypertensive chronic kidney disease with stage 1 through stage 4 chronic kidney disease, or unspecified chronic kidney disease: Secondary | ICD-10-CM | POA: Insufficient documentation

## 2018-12-09 DIAGNOSIS — N1330 Unspecified hydronephrosis: Secondary | ICD-10-CM | POA: Diagnosis not present

## 2018-12-09 DIAGNOSIS — Z960 Presence of urogenital implants: Secondary | ICD-10-CM | POA: Insufficient documentation

## 2018-12-09 DIAGNOSIS — Z7984 Long term (current) use of oral hypoglycemic drugs: Secondary | ICD-10-CM | POA: Insufficient documentation

## 2018-12-09 DIAGNOSIS — Z79899 Other long term (current) drug therapy: Secondary | ICD-10-CM | POA: Insufficient documentation

## 2018-12-09 DIAGNOSIS — R109 Unspecified abdominal pain: Secondary | ICD-10-CM

## 2018-12-09 DIAGNOSIS — N189 Chronic kidney disease, unspecified: Secondary | ICD-10-CM | POA: Diagnosis not present

## 2018-12-09 DIAGNOSIS — Z87891 Personal history of nicotine dependence: Secondary | ICD-10-CM | POA: Insufficient documentation

## 2018-12-09 LAB — CBC WITH DIFFERENTIAL/PLATELET
Abs Immature Granulocytes: 0.06 10*3/uL (ref 0.00–0.07)
Abs Immature Granulocytes: 0.05 10*3/uL (ref 0.00–0.07)
Basophils Absolute: 0 10*3/uL (ref 0.0–0.1)
Basophils Absolute: 0 10*3/uL (ref 0.0–0.1)
Basophils Relative: 0 %
Basophils Relative: 0 %
Eosinophils Absolute: 0.3 10*3/uL (ref 0.0–0.5)
Eosinophils Absolute: 0.3 10*3/uL (ref 0.0–0.5)
Eosinophils Relative: 3 %
Eosinophils Relative: 2 %
HCT: 43.1 % (ref 36.0–46.0)
HCT: 43.7 % (ref 36.0–46.0)
Hemoglobin: 14.3 g/dL (ref 12.0–15.0)
Hemoglobin: 14.6 g/dL (ref 12.0–15.0)
Immature Granulocytes: 1 %
Immature Granulocytes: 0 %
Lymphocytes Relative: 14 %
Lymphocytes Relative: 14 %
Lymphs Abs: 1.7 10*3/uL (ref 0.7–4.0)
Lymphs Abs: 1.7 10*3/uL (ref 0.7–4.0)
MCH: 30.3 pg (ref 26.0–34.0)
MCH: 30.2 pg (ref 26.0–34.0)
MCHC: 33.2 g/dL (ref 30.0–36.0)
MCHC: 33.4 g/dL (ref 30.0–36.0)
MCV: 91.3 fL (ref 80.0–100.0)
MCV: 90.5 fL (ref 80.0–100.0)
Monocytes Absolute: 1 10*3/uL (ref 0.1–1.0)
Monocytes Absolute: 1.2 10*3/uL — ABNORMAL HIGH (ref 0.1–1.0)
Monocytes Relative: 9 %
Monocytes Relative: 10 %
Neutro Abs: 8.5 10*3/uL — ABNORMAL HIGH (ref 1.7–7.7)
Neutro Abs: 9.2 10*3/uL — ABNORMAL HIGH (ref 1.7–7.7)
Neutrophils Relative %: 73 %
Neutrophils Relative %: 74 %
Platelets: 286 10*3/uL (ref 150–400)
Platelets: 297 10*3/uL (ref 150–400)
RBC: 4.72 MIL/uL (ref 3.87–5.11)
RBC: 4.83 MIL/uL (ref 3.87–5.11)
RDW: 12.8 % (ref 11.5–15.5)
RDW: 12.8 % (ref 11.5–15.5)
WBC: 11.5 10*3/uL — ABNORMAL HIGH (ref 4.0–10.5)
WBC: 12.4 10*3/uL — ABNORMAL HIGH (ref 4.0–10.5)
nRBC: 0 % (ref 0.0–0.2)
nRBC: 0 % (ref 0.0–0.2)

## 2018-12-09 LAB — COMPREHENSIVE METABOLIC PANEL
ALT: 18 U/L (ref 0–44)
ALT: 18 U/L (ref 0–44)
AST: 16 U/L (ref 15–41)
AST: 18 U/L (ref 15–41)
Albumin: 3.6 g/dL (ref 3.5–5.0)
Albumin: 3.6 g/dL (ref 3.5–5.0)
Alkaline Phosphatase: 54 U/L (ref 38–126)
Alkaline Phosphatase: 58 U/L (ref 38–126)
Anion gap: 12 (ref 5–15)
Anion gap: 11 (ref 5–15)
BUN: 11 mg/dL (ref 8–23)
BUN: 11 mg/dL (ref 8–23)
CO2: 23 mmol/L (ref 22–32)
CO2: 23 mmol/L (ref 22–32)
Calcium: 9.1 mg/dL (ref 8.9–10.3)
Calcium: 9.1 mg/dL (ref 8.9–10.3)
Chloride: 105 mmol/L (ref 98–111)
Chloride: 104 mmol/L (ref 98–111)
Creatinine, Ser: 0.99 mg/dL (ref 0.44–1.00)
Creatinine, Ser: 0.94 mg/dL (ref 0.44–1.00)
GFR calc Af Amer: >60 mL/min (ref >60)
GFR calc Af Amer: >60 mL/min (ref >60)
GFR calc non Af Amer: 59 mL/min — ABNORMAL LOW (ref >60)
GFR calc non Af Amer: >60 mL/min (ref >60)
Glucose, Bld: 167 mg/dL — ABNORMAL HIGH (ref 70–99)
Glucose, Bld: 158 mg/dL — ABNORMAL HIGH (ref 70–99)
Potassium: 3.3 mmol/L — ABNORMAL LOW (ref 3.5–5.1)
Potassium: 3.4 mmol/L — ABNORMAL LOW (ref 3.5–5.1)
Sodium: 140 mmol/L (ref 135–145)
Sodium: 138 mmol/L (ref 135–145)
Total Bilirubin: 0.5 mg/dL (ref 0.3–1.2)
Total Bilirubin: 0.4 mg/dL (ref 0.3–1.2)
Total Protein: 6.9 g/dL (ref 6.5–8.1)
Total Protein: 7 g/dL (ref 6.5–8.1)

## 2018-12-09 LAB — URINALYSIS, COMPLETE (UACMP) WITH MICROSCOPIC
Bacteria, UA: NONE SEEN
Bilirubin Urine: NEGATIVE
Glucose, UA: NEGATIVE mg/dL
Ketones, ur: NEGATIVE mg/dL
Leukocytes,Ua: NEGATIVE
Nitrite: NEGATIVE
Protein, ur: 100 mg/dL — AB
RBC / HPF: >50 RBC/hpf — ABNORMAL HIGH (ref 0–5)
Specific Gravity, Urine: 1.013 (ref 1.005–1.030)
pH: 5 (ref 5.0–8.0)

## 2018-12-09 MED ORDER — KETOROLAC TROMETHAMINE 30 MG/ML IJ SOLN: 15.0000 mg | Freq: Once | INTRAMUSCULAR | Status: DC

## 2018-12-09 MED ORDER — ONDANSETRON 4 MG PO TBDP
4.0000 mg | ORAL_TABLET | Freq: Once | ORAL | Status: AC
  Administered 2018-12-09: 4 mg via ORAL
  Filled 2018-12-09: qty 1

## 2018-12-09 MED ORDER — ONDANSETRON HCL 4 MG/2ML IJ SOLN
4.0000 mg | Freq: Once | INTRAMUSCULAR | Status: AC
  Administered 2018-12-09: 4 mg via INTRAVENOUS
  Filled 2018-12-09: qty 2

## 2018-12-09 MED ORDER — FENTANYL CITRATE (PF) 100 MCG/2ML IJ SOLN
50.0000 ug | Freq: Once | INTRAMUSCULAR | Status: AC
  Administered 2018-12-09: 06:00:00 50 ug via INTRAVENOUS
  Filled 2018-12-09: qty 2

## 2018-12-09 MED ORDER — OXYCODONE HCL 5 MG PO TABS: 2.5000 mg | ORAL_TABLET | Freq: Four times a day (QID) | ORAL | 0 refills | Status: AC | PRN

## 2018-12-09 NOTE — ED Provider Notes (Signed)
7:52 AM Assumed care for off going team.   Blood pressure (!) 146/85, pulse 76, temperature 98.7 F (37.1 C), resp. rate (!) 25, SpO2 94 %.  See their HPI for full report but in brief   Stent on 31st and s/p large right lower stone pole obliterated and all fragements removed, pulled stent yesterday.  Pending UA and [place].  if + HYDRO then d/w urology. ash [place] Ma.   D/w Dr. Junious Silk.  These are expected findings on urine as well as ultrasound for patient.  Would hold off on antibiotics or CT imaging.  Given patient is feeling better would recommend patient be discharged home. Pt to get [place] in one month.     We will give patient 3 oxycodones in case this possibly happens again at home.  Patient understand that she should not drive and should be sitting in bed when she takes this medicine to prevent falls.  9:34 AM reevaluated patient she continues to do well.  I discussed the provisional nature of ED diagnosis, the treatment so far, the ongoing plan of care, follow up appointments and return precautions with the patient and any family or support people present. They expressed understanding and agreed with the plan, discharged home.           Vanessa Puxico, MD 12/09/18 [phone]

## 2018-12-09 NOTE — ED Notes (Signed)
Pt up to use bathroom at this time. 

## 2018-12-09 NOTE — ED Triage Notes (Addendum)
PT states she had kidney stones removed on Monday but has had minimal pain relief. Pt states she was seen here yesterday for same complaint and "still is in pain." PT denies acute/new complaints. Pt states "the IV is the only thing that makes it better."

## 2018-12-09 NOTE — Discharge Instructions (Addendum)
Your ultrasound was as below.  I discussed with urology team who recommended holding off on further imaging at this is most likely related to a spasm of the ureter.  I will give you just a couple oxycodone for breakthrough pain.  However if you develop fevers, vomiting, or any other concerns you should return to the ER immediately.  1. Mild-to-moderate right hydronephrosis. 2. Bladder thickening at the right UVJ with overlying layering calcification. 3. Nephrolithiasis

## 2018-12-09 NOTE — ED Triage Notes (Signed)
Pt had surgery to remove kidney stones on Monday and had stent placed that pt was to remove on 9/4. After removal of stent, pt has had increased pain on right flank/lower back as well as N/V.

## 2018-12-09 NOTE — ED Provider Notes (Signed)
[facility] Emergency Department Provider Note  ____________________________________________  Time seen: Approximately 6:14 AM  I have reviewed the triage vital signs and the nursing notes.   HISTORY  Chief Complaint Flank Pain   HPI Sheryl CowmanDonna F Jetter is a 68 y.o. female with a history of kidney stones presents for evaluation of right flank pain.   Patient underwent a cystoscopy with ureteral stent placement 5 days ago for a right-sided kidney stone.  She was told by her urologist to pull the stent yesterday.  Since removing the stent patient has had pain which has become severe this morning.  She describes the pain as throbbing/spasms located in the right flank, constant and nonradiating.  She has had nausea and dry heaving but no vomiting, no fever, no dysuria or hematuria, no abdominal pain.  Past Medical History:  Diagnosis Date  . Anxiety   . Arthritis    lower back, left hip   . Chicken pox   . Chronic kidney disease   . Complication of anesthesia   . Diabetes mellitus without complication (HCC)   . GERD (gastroesophageal reflux disease)   . History of hiatal hernia   . History of kidney stones   . Hypercholesterolemia   . Hypertension   . Migraines    rare now  . Nephrolithiasis   . PONV (postoperative nausea and vomiting)    VOMITED A LITTLE BIT AFTER KIDNEY STONE SURGERY    Patient Active Problem List   Diagnosis Date Noted  . Kidney stone on left side 05/22/2018  . 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 Biceps tendinitis of right upper extremity 01/21/2017  . Urine test positive for microalbuminuria 08/31/2016  . Chronic insomnia 08/20/2014  . Anxiety 02/19/2014  . Diabetes mellitus type 2, uncomplicated (HCC) 01/22/2014  . Hypercholesterolemia 01/22/2014  . Hypertension 01/22/2014  . Back pain 07/05/2013  . Foreign body in bladder and urethra 12/07/2012  . Renal colic 11/15/2012  . Disorder of calcium  metabolism 03/09/2012  . Gross hematuria 03/09/2012  . Hydronephrosis 03/09/2012  . Kidney stone 03/09/2012  . Mixed urge and stress incontinence 03/09/2012  . Ureteric stone 03/09/2012    Past Surgical History:  Procedure Laterality Date  . BREAST CYST ASPIRATION Left    negative 2012  . CHOLECYSTECTOMY    . COLONOSCOPY    . COLONOSCOPY, ESOPHAGOGASTRODUODENOSCOPY (EGD) AND ESOPHAGEAL DILATION    . CYSTOSCOPY W/ RETROGRADES Bilateral 12/04/2018   Procedure: CYSTOSCOPY WITH RETROGRADE PYELOGRAM;  Surgeon: Vanna ScotlandBrandon, Ashley, MD;  Location: [facility];  Service: Urology;  Laterality: Bilateral;  . CYSTOSCOPY/URETEROSCOPY/HOLMIUM LASER/STENT PLACEMENT Left 06/26/2018   Procedure: CYSTOSCOPY/URETEROSCOPY/HOLMIUM LASER/STENT Exchange;  Surgeon: Vanna ScotlandBrandon, Ashley, MD;  Location: [facility];  Service: Urology;  Laterality: Left;  . CYSTOSCOPY/URETEROSCOPY/HOLMIUM LASER/STENT PLACEMENT Right 12/04/2018   Procedure: CYSTOSCOPY/URETEROSCOPY/HOLMIUM LASER/STENT PLACEMENT;  Surgeon: Vanna ScotlandBrandon, Ashley, MD;  Location: [facility];  Service: Urology;  Laterality: Right;  . DISTAL BICEPS TENDON REPAIR Right 01/19/2017   Procedure: BICEPS TENODESIS;  Surgeon: Christena FlakePoggi, John J, MD;  [facility];  Service: Orthopedics;  Laterality: Right;  Diabetic - oral meds  . ESOPHAGOGASTRODUODENOSCOPY (EGD) WITH PROPOFOL N/A 01/31/2015   Procedure: ESOPHAGOGASTRODUODENOSCOPY (EGD) WITH PROPOFOL;  Surgeon: Scot Junobert T Elliott, MD;  Location[facility];  Service: Endoscopy;  Laterality: N/A;  . esophogastroduodenoscopy    . esophogastroduodeoscopy    . HERNIA REPAIR    . IR NEPHROSTOMY PLACEMENT LEFT  05/22/2018  . NEPHROLITHOTOMY Left 05/22/2018   Procedure: NEPHROLITHOTOMY  PERCUTANEOUS;  Surgeon: Vanna ScotlandBrandon, Ashley, MD;  Location: [facility];  Service: Urology;  Laterality: Left;  . SAVORY DILATION N/A 01/31/2015   Procedure: SAVORY DILATION;  Surgeon: Scot Junobert T Elliott, MD;  Location[facility];  Service: Endoscopy;   Laterality: N/A;  . SHOULDER ARTHROSCOPY Right 01/19/2017   Procedure: ARTHROSCOPY SHOULDER DEBRIDEMENT DECOMPRESSION AND  ROTATOR CUFF REPAIR AND BICEPS TENODESIS;  Surgeon: Christena FlakePoggi, John J, MD;  Location: [facility];  Service: Orthopedics;  Laterality: Right;    Prior to Admission medications   Medication Sig Start Date End Date Taking? Authorizing Provider  Cholecalciferol (VITAMIN D3) 50 MCG (2000 UT) capsule Take 2,000 Units by mouth daily.     [provider]  citalopram (CELEXA) 20 MG tablet Take 20 mg by mouth every morning.     [provider]  famotidine (PEPCID AC) 10 MG tablet Take 10 mg by mouth every other day.     [provider]  HYDROcodone-acetaminophen (NORCO/VICODIN) 5-325 MG tablet Take 1-2 tablets by mouth every 6 (six) hours as needed for moderate pain. 12/04/18   Vanna ScotlandBrandon, Ashley, MD  lisinopril (PRINIVIL,ZESTRIL) 5 MG tablet Take 5 mg by mouth every morning.     [provider]  metFORMIN (GLUCOPHAGE) 500 MG tablet Take 500 mg by mouth daily with breakfast.     [provider]  ondansetron (ZOFRAN) 4 MG tablet Take 1 tablet (4 mg total) by mouth every 8 (eight) hours as needed for up to 5 days. 12/08/18 12/13/18  Vaillancourt, Lelon MastSamantha, PA-C  oxybutynin (DITROPAN) 5 MG tablet Take 1 tablet (5 mg total) by mouth every 8 (eight) hours as needed for bladder spasms. 12/04/18   Vanna ScotlandBrandon, Ashley, MD  oxyCODONE (ROXICODONE) 5 MG immediate release tablet Take 0.5 tablets (2.5 mg total) by mouth every 6 (six) hours as needed for up to 2 days. 12/09/18 12/11/18  Concha SeFunke, Mary E, MD  rosuvastatin (CRESTOR) 5 MG tablet Take 5 mg by mouth at bedtime.     [provider]  tamsulosin (FLOMAX) 0.4 MG CAPS capsule Take 1 capsule (0.4 mg total) by mouth daily. 12/04/18   Vanna ScotlandBrandon, Ashley, MD  traMADol (ULTRAM) 50 MG tablet Take 1 tablet (50 mg total) by mouth every 6 (six) hours as needed. 12/10/18 12/10/19  Willy Eddyobinson, Patrick, MD  vitamin B-12  (CYANOCOBALAMIN) 1000 MCG tablet Take 1,000 mcg by mouth daily.    [provider]    Allergies Patient has no known allergies.  History reviewed. No pertinent family history.  Social History Social History   Tobacco Use  . Smoking status: Former Smoker    Packs/day: 1.50    Years: 25.00    Pack years: 37.50    Types: Cigarettes    Quit date: 2010    Years since quitting: 10.6  . Smokeless tobacco: Never Used  Substance Use Topics  . Alcohol use: No  . Drug use: No    Review of Systems  Constitutional: Negative for fever. Eyes: Negative for visual changes. ENT: Negative for sore throat. Neck: No neck pain  Cardiovascular: Negative for chest pain. Respiratory: Negative for shortness of breath. Gastrointestinal: Negative for abdominal pain, vomiting or diarrhea. Genitourinary: Negative for dysuria. + R flank pain Musculoskeletal: Negative for back pain. Skin: Negative for rash. Neurological: Negative for headaches, weakness or numbness. Psych: No SI or HI  ____________________________________________   PHYSICAL EXAM:  VITAL SIGNS: ED Triage Vitals [12/09/18 0557]  Enc Vitals Group     BP (!) 161/68  Pulse Rate 81     Resp 16     Temp 98.7 F (37.1 C)     Temp src      SpO2 92 %     Weight      Height      Head Circumference      Peak Flow      Pain Score      Pain Loc      Pain Edu?      Excl. in [place]?     Constitutional: Alert and oriented. Well appearing and in no apparent distress. HEENT:      Head: Normocephalic and atraumatic.         Eyes: Conjunctivae are normal. Sclera is non-icteric.       Mouth/Throat: Mucous membranes are moist.       Neck: Supple with no signs of meningismus. Cardiovascular: Regular rate and rhythm. No murmurs, gallops, or rubs. 2+ symmetrical distal pulses are present in all extremities. No JVD. Respiratory: Normal respiratory effort. Lungs are clear to auscultation bilaterally. No wheezes, crackles, or  rhonchi.  Gastrointestinal: Soft, non tender, and non distended with positive bowel sounds. No rebound or guarding. Genitourinary: R CVA tenderness. Musculoskeletal: Nontender with normal range of motion in all extremities. No edema, cyanosis, or erythema of extremities. Neurologic: Normal speech and language. Face is symmetric. Moving all extremities. No gross focal neurologic deficits are appreciated. Skin: Skin is warm, dry and intact. No rash noted. Psychiatric: Mood and affect are normal. Speech and behavior are normal.  ____________________________________________   LABS (all labs ordered are listed, but only abnormal results are displayed)  Labs Reviewed  CBC WITH DIFFERENTIAL/PLATELET - Abnormal; Notable for the following components:      Result Value   WBC 11.5 (*)    Neutro Abs 8.5 (*)    All other components within normal limits  COMPREHENSIVE METABOLIC PANEL - Abnormal; Notable for the following components:   Potassium 3.3 (*)    Glucose, Bld 167 (*)    GFR calc non Af Amer 59 (*)    All other components within normal limits  URINALYSIS, COMPLETE (UACMP) WITH MICROSCOPIC - Abnormal; Notable for the following components:   Color, Urine YELLOW (*)    APPearance HAZY (*)    Hgb urine dipstick LARGE (*)    Protein, ur 100 (*)    RBC / HPF >50 (*)    All other components within normal limits   ____________________________________________  EKG  ED ECG REPORT I, Rudene Re, the attending physician, personally viewed and interpreted this ECG.  Sinus bradycardia, rate of 54, normal intervals, normal axis, no ST elevations or depressions.  Unchanged from prior. ____________________________________________  RADIOLOGY  Renal US: PND ____________________________________________   PROCEDURES  Procedure(s) performed: None Procedures Critical Care performed:  None ____________________________________________   INITIAL IMPRESSION / ASSESSMENT AND PLAN / ED  COURSE   68 y.o. female with a history of kidney stones presents for evaluation of right flank pain that started yesterday after removing a ureteral stent that was placed on 8/31 by Dr. Erlene Quan for kidney stone.  Patient is well-appearing in no distress with normal vitals, she has right flank tenderness with no abdominal tenderness. Ddx retained stone, new kidney stone, UTI, pyelo. Plan for CBC, CMP, UA, US renal to eval for signs of hydronephrosis/obstructing stone.  Will give fentanyl and Zofran for symptom relief.   Patient was signed out to Dr. Marjean Rika at [facility] pending results of urinalysis and renal US.  As part of my medical decision making, I reviewed the following data within the electronic MEDICAL RECORD NUMBER Nursing notes reviewed and incorporated, Labs reviewed , Old chart reviewed, Notes from prior ED visits and [place] Controlled Substance Database   Patient was evaluated in Emergency Department today for the symptoms described in the history of present illness. Patient was evaluated in the context of the global COVID-19 pandemic, which necessitated consideration that the patient might be at risk for infection with the SARS-CoV-2 virus that causes COVID-19. Institutional protocols and algorithms that pertain to the evaluation of patients at risk for COVID-19 are in a state of rapid change based on information released by regulatory bodies including the CDC and federal and state organizations. These policies and algorithms were followed during the patient's care in the ED.   ____________________________________________   FINAL CLINICAL IMPRESSION(S) / ED DIAGNOSES   Final diagnoses:  Flank pain      NEW MEDICATIONS STARTED DURING THIS VISIT:  ED Discharge Orders         Ordered    oxyCODONE (ROXICODONE) 5 MG immediate release tablet  Every 6 hours PRN     12/09/18 0937           Note:  This document was prepared using Dragon voice recognition software and may include  unintentional dictation errors.    Don Perking, [place], MD 12/11/18 1049

## 2018-12-10 ENCOUNTER — Emergency Department
Admission: EM | Admit: 2018-12-10 | Discharge: 2018-12-10 | Disposition: A | Discharge status: Home / Self Care | Payer: Medicare Other | Attending: Student in an Organized Health Care Education/Training Program | Admitting: Student in an Organized Health Care Education/Training Program

## 2018-12-10 ENCOUNTER — Emergency Department: Payer: Medicare Other

## 2018-12-10 ENCOUNTER — Encounter: Payer: Self-pay | Admitting: Emergency Medicine

## 2018-12-10 ENCOUNTER — Other Ambulatory Visit: Payer: Self-pay

## 2018-12-10 DIAGNOSIS — Z960 Presence of urogenital implants: Secondary | ICD-10-CM | POA: Insufficient documentation

## 2018-12-10 DIAGNOSIS — R109 Unspecified abdominal pain: Secondary | ICD-10-CM | POA: Insufficient documentation

## 2018-12-10 DIAGNOSIS — E1122 Type 2 diabetes mellitus with diabetic chronic kidney disease: Secondary | ICD-10-CM | POA: Insufficient documentation

## 2018-12-10 DIAGNOSIS — I129 Hypertensive chronic kidney disease with stage 1 through stage 4 chronic kidney disease, or unspecified chronic kidney disease: Secondary | ICD-10-CM | POA: Insufficient documentation

## 2018-12-10 DIAGNOSIS — Z79899 Other long term (current) drug therapy: Secondary | ICD-10-CM | POA: Diagnosis not present

## 2018-12-10 DIAGNOSIS — Z7984 Long term (current) use of oral hypoglycemic drugs: Secondary | ICD-10-CM | POA: Diagnosis not present

## 2018-12-10 DIAGNOSIS — N189 Chronic kidney disease, unspecified: Secondary | ICD-10-CM | POA: Diagnosis not present

## 2018-12-10 DIAGNOSIS — Z87891 Personal history of nicotine dependence: Secondary | ICD-10-CM | POA: Diagnosis not present

## 2018-12-10 LAB — CBC WITH DIFFERENTIAL/PLATELET
Abs Immature Granulocytes: 0.06 10*3/uL (ref 0.00–0.07)
Basophils Absolute: 0 10*3/uL (ref 0.0–0.1)
Basophils Relative: 0 %
Eosinophils Absolute: 0.6 10*3/uL — ABNORMAL HIGH (ref 0.0–0.5)
Eosinophils Relative: 5 %
HCT: 43 % (ref 36.0–46.0)
Hemoglobin: 14.3 g/dL (ref 12.0–15.0)
Immature Granulocytes: 1 %
Lymphocytes Relative: 18 %
Lymphs Abs: 2 10*3/uL (ref 0.7–4.0)
MCH: 30.5 pg (ref 26.0–34.0)
MCHC: 33.3 g/dL (ref 30.0–36.0)
MCV: 91.7 fL (ref 80.0–100.0)
Monocytes Absolute: 1.1 10*3/uL — ABNORMAL HIGH (ref 0.1–1.0)
Monocytes Relative: 9 %
Neutro Abs: 7.7 10*3/uL (ref 1.7–7.7)
Neutrophils Relative %: 67 %
Platelets: 291 10*3/uL (ref 150–400)
RBC: 4.69 MIL/uL (ref 3.87–5.11)
RDW: 13.1 % (ref 11.5–15.5)
WBC: 11.5 10*3/uL — ABNORMAL HIGH (ref 4.0–10.5)
nRBC: 0 % (ref 0.0–0.2)

## 2018-12-10 LAB — URINALYSIS, COMPLETE (UACMP) WITH MICROSCOPIC
Bacteria, UA: NONE SEEN
Bilirubin Urine: NEGATIVE
Glucose, UA: NEGATIVE mg/dL
Ketones, ur: NEGATIVE mg/dL
Leukocytes,Ua: NEGATIVE
Nitrite: NEGATIVE
Protein, ur: 100 mg/dL — AB
RBC / HPF: >50 RBC/hpf — ABNORMAL HIGH (ref 0–5)
Specific Gravity, Urine: 1.008 (ref 1.005–1.030)
Squamous Epithelial / HPF: NONE SEEN (ref 0–5)
pH: 6 (ref 5.0–8.0)

## 2018-12-10 LAB — BASIC METABOLIC PANEL
Anion gap: 10 (ref 5–15)
BUN: 13 mg/dL (ref 8–23)
CO2: 27 mmol/L (ref 22–32)
Calcium: 9.5 mg/dL (ref 8.9–10.3)
Chloride: 103 mmol/L (ref 98–111)
Creatinine, Ser: 1.1 mg/dL — ABNORMAL HIGH (ref 0.44–1.00)
GFR calc Af Amer: >60 mL/min (ref >60)
GFR calc non Af Amer: 52 mL/min — ABNORMAL LOW (ref >60)
Glucose, Bld: 124 mg/dL — ABNORMAL HIGH (ref 70–99)
Potassium: 3.5 mmol/L (ref 3.5–5.1)
Sodium: 140 mmol/L (ref 135–145)

## 2018-12-10 LAB — LACTIC ACID, PLASMA: Lactic Acid, Venous: 1.5 mmol/L (ref 0.5–1.9)

## 2018-12-10 MED ORDER — TRAMADOL HCL 50 MG PO TABS: 50.0000 mg | ORAL_TABLET | Freq: Four times a day (QID) | ORAL | 0 refills | Status: DC | PRN

## 2018-12-10 MED ORDER — MORPHINE SULFATE (PF) 4 MG/ML IV SOLN
4.0000 mg | INTRAVENOUS | Status: DC | PRN
  Administered 2018-12-10: 4 mg via INTRAVENOUS
  Filled 2018-12-10: qty 1

## 2018-12-10 MED ORDER — ONDANSETRON HCL 4 MG/2ML IJ SOLN
4.0000 mg | Freq: Once | INTRAMUSCULAR | Status: AC
  Administered 2018-12-10: 4 mg via INTRAVENOUS
  Filled 2018-12-10: qty 2

## 2018-12-10 NOTE — ED Triage Notes (Signed)
Pt arrives POV to triage with c/o being in pain since Monday when she had lithotripsy. Pt states that she was told that she could take out her stent Friday morning and has had spasms since that time. Pt denies any new complaints and was here yesterday for the same.

## 2018-12-10 NOTE — Discharge Instructions (Signed)

## 2018-12-10 NOTE — ED Notes (Signed)
No further orders per MD Archie Balboa.

## 2018-12-10 NOTE — ED Provider Notes (Signed)
[facility] Emergency Department Provider Note    First MD Initiated Contact with Patient 12/10/18 2020     (approximate)  I have reviewed the triage vital signs and the nursing notes.   HISTORY  Chief Complaint Post-op Problem    HPI Sheryl CowmanDonna F [name] is a 68 y.o. female with recent ureteral stent placement urological procedure for ureterolithiasis free presents to the ER for persistent right flank pain.  Still having hematuria.  Denies any fevers.  States that the pain radiates from her right flank down her right groin.    Past Medical History:  Diagnosis Date  . Anxiety   . Arthritis    lower back, left hip   . Chicken pox   . Chronic kidney disease   . Complication of anesthesia   . Diabetes mellitus without complication (HCC)   . GERD (gastroesophageal reflux disease)   . History of hiatal hernia   . History of kidney stones   . Hypercholesterolemia   . Hypertension   . Migraines    rare now  . Nephrolithiasis   . PONV (postoperative nausea and vomiting)    VOMITED A LITTLE BIT AFTER KIDNEY STONE SURGERY   No family history on file. Past Surgical History:  Procedure Laterality Date  . BREAST CYST ASPIRATION Left    negative 2012  . CHOLECYSTECTOMY    . COLONOSCOPY    . COLONOSCOPY, ESOPHAGOGASTRODUODENOSCOPY (EGD) AND ESOPHAGEAL DILATION    . CYSTOSCOPY W/ RETROGRADES Bilateral 12/04/2018   Procedure: CYSTOSCOPY WITH RETROGRADE PYELOGRAM;  Surgeon: Vanna ScotlandBrandon, Ashley, MD;  Location: [facility];  Service: Urology;  Laterality: Bilateral;  . CYSTOSCOPY/URETEROSCOPY/HOLMIUM LASER/STENT PLACEMENT Left 06/26/2018   Procedure: CYSTOSCOPY/URETEROSCOPY/HOLMIUM LASER/STENT Exchange;  Surgeon: Vanna ScotlandBrandon, Ashley, MD;  Location: [facility];  Service: Urology;  Laterality: Left;  . CYSTOSCOPY/URETEROSCOPY/HOLMIUM LASER/STENT PLACEMENT Right 12/04/2018   Procedure: CYSTOSCOPY/URETEROSCOPY/HOLMIUM LASER/STENT PLACEMENT;  Surgeon: Vanna ScotlandBrandon, Ashley, MD;  Location:  [facility];  Service: Urology;  Laterality: Right;  . DISTAL BICEPS TENDON REPAIR Right 01/19/2017   Procedure: BICEPS TENODESIS;  Surgeon: Christena FlakePoggi, John J, MD;  [facility];  Service: Orthopedics;  Laterality: Right;  Diabetic - oral meds  . ESOPHAGOGASTRODUODENOSCOPY (EGD) WITH PROPOFOL N/A 01/31/2015   Procedure: ESOPHAGOGASTRODUODENOSCOPY (EGD) WITH PROPOFOL;  Surgeon: Scot Junobert T Elliott, MD;  [facility];  Service: Endoscopy;  Laterality: N/A;  . esophogastroduodenoscopy    . esophogastroduodeoscopy    . HERNIA REPAIR    . IR NEPHROSTOMY PLACEMENT LEFT  05/22/2018  . NEPHROLITHOTOMY Left 05/22/2018   Procedure: NEPHROLITHOTOMY PERCUTANEOUS;  Surgeon: Vanna ScotlandBrandon, Ashley, MD;  Location: [facility];  Service: Urology;  Laterality: Left;  . SAVORY DILATION N/A 01/31/2015   Procedure: SAVORY DILATION;  Surgeon: Scot Junobert T Elliott, MD;  [facility];  Service: Endoscopy;  Laterality: N/A;  . SHOULDER ARTHROSCOPY Right 01/19/2017   Procedure: ARTHROSCOPY SHOULDER DEBRIDEMENT DECOMPRESSION AND  ROTATOR CUFF REPAIR AND BICEPS TENODESIS;  Surgeon: Christena FlakePoggi, John J, MD;  Location: [facility];  Service: Orthopedics;  Laterality: Right;   Patient Active Problem List   Diagnosis Date Noted  . Kidney stone on left side 05/22/2018  . 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 Biceps tendinitis of right upper extremity 01/21/2017  . Urine test positive for microalbuminuria 08/31/2016  . Chronic insomnia 08/20/2014  . Anxiety 02/19/2014  . Diabetes mellitus type 2, uncomplicated (HCC) 01/22/2014  . Hypercholesterolemia 01/22/2014  . Hypertension 01/22/2014  . Back pain 07/05/2013  . Foreign body in  bladder and urethra 12/07/2012  . Renal colic 11/15/2012  . Disorder of calcium metabolism 03/09/2012  . Gross hematuria 03/09/2012  . Hydronephrosis 03/09/2012  . Kidney stone 03/09/2012  . Mixed urge and stress incontinence 03/09/2012  .  Ureteric stone 03/09/2012      Prior to Admission medications   Medication Sig Start Date End Date Taking? Authorizing Provider  Cholecalciferol (VITAMIN D3) 50 MCG (2000 UT) capsule Take 2,000 Units by mouth daily.     [provider]  citalopram (CELEXA) 20 MG tablet Take 20 mg by mouth every morning.     [provider]  famotidine (PEPCID AC) 10 MG tablet Take 10 mg by mouth every other day.     [provider]  HYDROcodone-acetaminophen (NORCO/VICODIN) 5-325 MG tablet Take 1-2 tablets by mouth every 6 (six) hours as needed for moderate pain. 12/04/18   Vanna ScotlandBrandon, Ashley, MD  lisinopril (PRINIVIL,ZESTRIL) 5 MG tablet Take 5 mg by mouth every morning.     [provider]  metFORMIN (GLUCOPHAGE) 500 MG tablet Take 500 mg by mouth daily with breakfast.     [provider]  ondansetron (ZOFRAN) 4 MG tablet Take 1 tablet (4 mg total) by mouth every 8 (eight) hours as needed for up to 5 days. 12/08/18 12/13/18  Vaillancourt, Lelon MastSamantha, PA-C  oxybutynin (DITROPAN) 5 MG tablet Take 1 tablet (5 mg total) by mouth every 8 (eight) hours as needed for bladder spasms. 12/04/18   Vanna ScotlandBrandon, Ashley, MD  oxyCODONE (ROXICODONE) 5 MG immediate release tablet Take 0.5 tablets (2.5 mg total) by mouth every 6 (six) hours as needed for up to 2 days. 12/09/18 12/11/18  Concha SeFunke, Mary E, MD  rosuvastatin (CRESTOR) 5 MG tablet Take 5 mg by mouth at bedtime.     [provider]  tamsulosin (FLOMAX) 0.4 MG CAPS capsule Take 1 capsule (0.4 mg total) by mouth daily. 12/04/18   Vanna ScotlandBrandon, Ashley, MD  vitamin B-12 (CYANOCOBALAMIN) 1000 MCG tablet Take 1,000 mcg by mouth daily.    [provider]    Allergies Patient has no known allergies.    Social History Social History   Tobacco Use  . Smoking status: Former Smoker    Packs/day: 1.50    Years: 25.00    Pack years: 37.50    Types: Cigarettes    Quit date: 2010    Years since quitting: 10.6  . Smokeless tobacco:  Never Used  Substance Use Topics  . Alcohol use: No  . Drug use: No    Review of Systems Patient denies headaches, rhinorrhea, blurry vision, numbness, shortness of breath, chest pain, edema, cough, abdominal pain, nausea, vomiting, diarrhea, dysuria, fevers, rashes or hallucinations unless otherwise stated above in HPI. ____________________________________________   PHYSICAL EXAM:  VITAL SIGNS: Vitals:   12/10/18 2102 12/10/18 2145  BP: 134/68 (!) 142/55  Pulse:  70  Resp:  16  Temp:    SpO2: 98% 95%    Constitutional: Alert and oriented.  Eyes: Conjunctivae are normal.  Head: Atraumatic. Nose: No congestion/rhinnorhea. Mouth/Throat: Mucous membranes are moist.   Neck: No stridor. Painless ROM.  Cardiovascular: Normal rate, regular rhythm. Grossly normal heart sounds.  Good peripheral circulation. Respiratory: Normal respiratory effort.  No retractions. Lungs CTAB. Gastrointestinal: Soft and nontender. No distention. No abdominal bruits. No CVA tenderness. Genitourinary:  Musculoskeletal: No lower extremity tenderness nor edema.  No joint effusions. Neurologic:  Normal speech and language. No gross focal neurologic deficits are appreciated. No facial droop Skin:  Skin  is warm, dry and intact. No rash noted. Psychiatric: Mood and affect are normal. Speech and behavior are normal.  ____________________________________________   LABS (all labs ordered are listed, but only abnormal results are displayed)  Results for orders placed or performed during the hospital encounter of 12/10/18 (from the past 24 hour(s))  Lactic acid, plasma     Status: None   Collection Time: 12/10/18  7:17 PM  Result Value Ref Range   Lactic Acid, Venous 1.5 0.5 - 1.9 mmol/L  CBC with Differential     Status: Abnormal   Collection Time: 12/10/18  7:17 PM  Result Value Ref Range   WBC 11.5 (H) 4.0 - 10.5 K/uL   RBC 4.69 3.87 - 5.11 MIL/uL   Hemoglobin 14.3 12.0 - 15.0 g/dL   HCT 33.5 45.6 -  25.6 %   MCV 91.7 80.0 - 100.0 fL   MCH 30.5 26.0 - 34.0 pg   MCHC 33.3 30.0 - 36.0 g/dL   RDW 38.9 37.3 - 42.8 %   Platelets 291 150 - 400 K/uL   nRBC 0.0 0.0 - 0.2 %   Neutrophils Relative % 67 %   Neutro Abs 7.7 1.7 - 7.7 K/uL   Lymphocytes Relative 18 %   Lymphs Abs 2.0 0.7 - 4.0 K/uL   Monocytes Relative 9 %   Monocytes Absolute 1.1 (H) 0.1 - 1.0 K/uL   Eosinophils Relative 5 %   Eosinophils Absolute 0.6 (H) 0.0 - 0.5 K/uL   Basophils Relative 0 %   Basophils Absolute 0.0 0.0 - 0.1 K/uL   Immature Granulocytes 1 %   Abs Immature Granulocytes 0.06 0.00 - 0.07 K/uL  Basic metabolic panel     Status: Abnormal   Collection Time: 12/10/18  7:17 PM  Result Value Ref Range   Sodium 140 135 - 145 mmol/L   Potassium 3.5 3.5 - 5.1 mmol/L   Chloride 103 98 - 111 mmol/L   CO2 27 22 - 32 mmol/L   Glucose, Bld 124 (H) 70 - 99 mg/dL   BUN 13 8 - 23 mg/dL   Creatinine, Ser 7.68 (H) 0.44 - 1.00 mg/dL   Calcium 9.5 8.9 - 11.5 mg/dL   GFR calc non Af Amer 52 (L) >60 mL/min   GFR calc Af Amer >60 >60 mL/min   Anion gap 10 5 - 15  Urinalysis, Complete w Microscopic     Status: Abnormal   Collection Time: 12/10/18  7:17 PM  Result Value Ref Range   Color, Urine AMBER (A) YELLOW   APPearance HAZY (A) CLEAR   Specific Gravity, Urine 1.008 1.005 - 1.030   pH 6.0 5.0 - 8.0   Glucose, UA NEGATIVE NEGATIVE mg/dL   Hgb urine dipstick LARGE (A) NEGATIVE   Bilirubin Urine NEGATIVE NEGATIVE   Ketones, ur NEGATIVE NEGATIVE mg/dL   Protein, ur 726 (A) NEGATIVE mg/dL   Nitrite NEGATIVE NEGATIVE   Leukocytes,Ua NEGATIVE NEGATIVE   RBC / HPF >50 (H) 0 - 5 RBC/hpf   WBC, UA 0-5 0 - 5 WBC/hpf   Bacteria, UA NONE SEEN NONE SEEN   Squamous Epithelial / LPF NONE SEEN 0 - 5   ____________________________________________ _____________________________  RADIOLOGY  I personally reviewed all radiographic images ordered to evaluate for the above acute complaints and reviewed radiology reports and  findings.  These findings were personally discussed with the patient.  Please see medical record for radiology report.  ____________________________________________   PROCEDURES  Procedure(s) performed:  Procedures  Critical Care performed: no ____________________________________________   INITIAL IMPRESSION / ASSESSMENT AND PLAN / ED COURSE  Pertinent labs & imaging results that were available during my care of the patient were reviewed by me and considered in my medical decision making (see chart for details).   DDX: ureterolithiasis, colic, pyelo, hematoma, cystitis  Sheryl [name] is a 68 y.o. who presents to the ED with flank pain as described above.  She is afebrile hemodynamically stable.  Will give pain medication and reassess.  Do feel patient requires CT imaging at this point given her persistent symptoms.  Clinical Course as of Dec 09 2229  Nancy Fetter Dec 10, 2018  2224 Patient reassessed.  Well-appearing and in no acute distress.  Pain is relieved.  CT imaging is suggestive of recent passed stone.  Does still have some moderate hydro.  She is currently pain-free I suspect she is having persistent ureteral spasm in the setting of the known edema.  The patient requires hospitalization at this time.  We discussed signs and symptoms for which he should return to the ER.  Discussed importance of follow-up with urology.   [PR]    Clinical Course User Index [PR] Merlyn Lot, MD    The patient was evaluated in Emergency Department today for the symptoms described in the history of present illness. He/she was evaluated in the context of the global COVID-19 pandemic, which necessitated consideration that the patient might be at risk for infection with the SARS-CoV-2 virus that causes COVID-19. Institutional protocols and algorithms that pertain to the evaluation of patients at risk for COVID-19 are in a state of rapid change based on information released by regulatory bodies  including the CDC and federal and state organizations. These policies and algorithms were followed during the patient's care in the ED.  As part of my medical decision making, I reviewed the following data within the [place] notes reviewed and incorporated, Labs reviewed, notes from prior ED visits and [place] Controlled Substance Database   ____________________________________________   FINAL CLINICAL IMPRESSION(S) / ED DIAGNOSES  Final diagnoses:  Flank pain, acute      NEW MEDICATIONS STARTED DURING THIS VISIT:  New Prescriptions   No medications on file     Note:  This document was prepared using Dragon voice recognition software and may include unintentional dictation errors.    Merlyn Lot, MD 12/10/18 2231

## 2018-12-10 NOTE — ED Notes (Signed)
Patient is sitting upright on side of the stretcher, stating she is afraid the pain will worsen if she lies down. Family at bedside. Patient states her spasms improved yesterday with the administration of Zofran ODT, but returned by bedtime. Patient contacted her doctor's office today who advised her to come to the ED for evaluation.

## 2018-12-12 ENCOUNTER — Telehealth: Payer: Self-pay | Admitting: Urology

## 2018-12-12 NOTE — Telephone Encounter (Signed)
Patient had surgery with you and went to the ER twice since having surgery. She was advised that she needed to follow up with you sooner than her October app. Can you review her ER notes and advise on this?   Thanks, Sharyn Lull

## 2018-12-12 NOTE — Telephone Encounter (Signed)
Called patient to follow up on afterhours nurse line, patient states she is having flank pain after stent removal. She did go to the ER. Message from Dr. Erlene Quan was read to patient and she states her pain is much better now but is still having gross hematuria. She was informed that this is ok post stent removal and should get better with time, if not she should contact the office to have a follow up with Sam otherwise keep follow up with Dr. Erlene Quan in October. Patient states that she has not had a bowel movement since before surgery and she thinks that this could be causing some of her pain. She was informed to take Dulcolax and/or fleets enema and if after today her constipation does not resolve she should follow up with PCP

## 2018-12-12 NOTE — Telephone Encounter (Signed)
CT scan was reviewed.  Scan findings are anticipated shortly following stent removal.  Hopefully her pain is improving.  If it is, I recommend following up with schedule with renal ultrasound prior.   It is important to give the kidney time to heal before repeating the ultrasound thus keeping the appointment in October is appropriate.  If she continues to have severe flank pain, which is not improving, we can work her in to see Sam.    Hollice Espy, MD

## 2018-12-13 DIAGNOSIS — Z9889 Other specified postprocedural states: Secondary | ICD-10-CM | POA: Insufficient documentation

## 2018-12-14 DIAGNOSIS — E559 Vitamin D deficiency, unspecified: Secondary | ICD-10-CM | POA: Insufficient documentation

## 2018-12-26 ENCOUNTER — Other Ambulatory Visit: Payer: Self-pay | Admitting: Urology

## 2019-01-02 ENCOUNTER — Other Ambulatory Visit: Payer: Self-pay

## 2019-01-02 ENCOUNTER — Ambulatory Visit
Admission: RE | Admit: 2019-01-02 | Discharge: 2019-01-02 | Disposition: A | Discharge status: Home / Self Care | Payer: Medicare Other | Source: Ambulatory Visit | Attending: Urology | Admitting: Urology

## 2019-01-02 DIAGNOSIS — N200 Calculus of kidney: Secondary | ICD-10-CM | POA: Diagnosis not present

## 2019-01-04 ENCOUNTER — Encounter: Payer: Self-pay | Admitting: Urology

## 2019-01-04 ENCOUNTER — Ambulatory Visit (INDEPENDENT_AMBULATORY_CARE_PROVIDER_SITE_OTHER): Payer: Medicare Other | Admitting: Urology

## 2019-01-04 ENCOUNTER — Other Ambulatory Visit: Payer: Self-pay

## 2019-01-04 VITALS — BP 156/82 | HR 103 | Ht 64.0 in | Wt 176.0 lb

## 2019-01-04 DIAGNOSIS — N200 Calculus of kidney: Secondary | ICD-10-CM | POA: Diagnosis not present

## 2019-01-04 NOTE — Progress Notes (Signed)
01/04/2019 11:43 AM   Sheryl [name] May 04, 1950 [id number]  Referring provider: Marina Goodell, MD [address] [place],  [place] [address]  Chief Complaint  Patient presents with  . Follow-up    HPI: 68 year old female who presents today for follow-up of kidney stones.  Most recently, she underwent right ureteroscopy on 12/04/2018 for 14 mm right lower pole stone.  Her postoperative course was complicated by 2 ER visits for ureteral spasm following stent removal.  Her pain ultimately improved.  She reports that she had a much harder time with ureteroscopy then with her previous PCNL which she was not anticipating.  Today, she has no urinary symptoms and no flank pain.  She is doing well.  She was up today with a renal ultrasound which shows resolution of her hydronephrosis (seen on CT scan shortly following stent removal). n all stones on the right are absent.  She does have a personal history of nephrolithiasis.  Most recently, she underwent left PCNL followed by staged ureteroscopy with very small stone fragments residual.  She also has a known right-sided nonobstructing stone.  Previous stone analysis showed 100% calcium oxalate monohydrate.  24-hour urine metabolic evaluation showed very low urinary volume of 1.27 L over 24-hour period. In addition, her urinary oxalate was 38 mg/day, borderline hyperoxaluria. Her urinary pH was also low, 5.28. Urinary calcium low, 97 mg/day. Urinary citrate sufficient 757 mg/day. Creatinine 0.73. Serum calcium 9.5. Magnesium 1.9. CO2 26.  She reports today that she continues to struggle with drinking more fluid.  She primarily drinks soda and a small amount.  She does not like water whatsoever.  She is had a hard time finding alternatives to increase her fluid intake.  PMH: Past Medical History:  Diagnosis Date  . Anxiety   . Arthritis    lower back, left hip   . Chicken pox   . Chronic kidney disease   . Complication of  anesthesia   . Diabetes mellitus without complication (HCC)   . GERD (gastroesophageal reflux disease)   . History of hiatal hernia   . History of kidney stones   . Hypercholesterolemia   . Hypertension   . Migraines    rare now  . Nephrolithiasis   . PONV (postoperative nausea and vomiting)    VOMITED A LITTLE BIT AFTER KIDNEY STONE SURGERY    Surgical History: Past Surgical History:  Procedure Laterality Date  . BREAST CYST ASPIRATION Left    negative 2012  . CHOLECYSTECTOMY    . COLONOSCOPY    . COLONOSCOPY, ESOPHAGOGASTRODUODENOSCOPY (EGD) AND ESOPHAGEAL DILATION    . CYSTOSCOPY W/ RETROGRADES Bilateral 12/04/2018   Procedure: CYSTOSCOPY WITH RETROGRADE PYELOGRAM;  Surgeon: Vanna Scotland, MD;  Location: [facility];  Service: Urology;  Laterality: Bilateral;  . CYSTOSCOPY/URETEROSCOPY/HOLMIUM LASER/STENT PLACEMENT Left 06/26/2018   Procedure: CYSTOSCOPY/URETEROSCOPY/HOLMIUM LASER/STENT Exchange;  Surgeon: Vanna Scotland, MD;  Location: [facility];  Service: Urology;  Laterality: Left;  . CYSTOSCOPY/URETEROSCOPY/HOLMIUM LASER/STENT PLACEMENT Right 12/04/2018   Procedure: CYSTOSCOPY/URETEROSCOPY/HOLMIUM LASER/STENT PLACEMENT;  Surgeon: Vanna Scotland, MD;  Location: [facility];  Service: Urology;  Laterality: Right;  . DISTAL BICEPS TENDON REPAIR Right 01/19/2017   Procedure: BICEPS TENODESIS;  Surgeon: Christena Flake, MD;  Location: [facility];  Service: Orthopedics;  Laterality: Right;  Diabetic - oral meds  . ESOPHAGOGASTRODUODENOSCOPY (EGD) WITH PROPOFOL N/A 01/31/2015   Procedure: ESOPHAGOGASTRODUODENOSCOPY (EGD) WITH PROPOFOL;  Surgeon: Scot Jun, MD;  Location: [facility] END[facility]COPY;  Service: Endoscopy;  Laterality: N/A;  . esophogastroduodenoscopy    .  esophogastroduodeoscopy    . HERNIA REPAIR    . IR NEPHROSTOMY PLACEMENT LEFT  05/22/2018  . NEPHROLITHOTOMY Left 05/22/2018   Procedure: NEPHROLITHOTOMY PERCUTANEOUS;  Surgeon: Hollice Espy, MD;  Location: [facility];   Service: Urology;  Laterality: Left;  . SAVORY DILATION N/A 01/31/2015   Procedure: SAVORY DILATION;  Surgeon: Manya Silvas, MD;  Location: [facility] END[facility]COPY;  Service: Endoscopy;  Laterality: N/A;  . SHOULDER ARTHROSCOPY Right 01/19/2017   Procedure: ARTHROSCOPY SHOULDER DEBRIDEMENT DECOMPRESSION AND  ROTATOR CUFF REPAIR AND BICEPS TENODESIS;  Surgeon: Corky Mull, MD;  Location: [facility];  Service: Orthopedics;  Laterality: Right;    Home Medications:  Allergies as of 01/04/2019   No Known Allergies     Medication List       Accurate as of January 04, 2019 11:43 AM. If you have any questions, ask your nurse or doctor.        STOP taking these medications   HYDROcodone-acetaminophen 5-325 MG tablet Commonly known as: NORCO/VICODIN Stopped by: Hollice Espy, MD   oxybutynin 5 MG tablet Commonly known as: DITROPAN Stopped by: Hollice Espy, MD   traMADol 50 MG tablet Commonly known as: Ultram Stopped by: Hollice Espy, MD     TAKE these medications   citalopram 20 MG tablet Commonly known as: CELEXA Take 20 mg by mouth every morning.   lisinopril 5 MG tablet Commonly known as: ZESTRIL Take 5 mg by mouth every morning.   metFORMIN 500 MG tablet Commonly known as: GLUCOPHAGE Take 500 mg by mouth daily with breakfast.   Pepcid AC 10 MG tablet Generic drug: famotidine Take 10 mg by mouth every other day.   rosuvastatin 5 MG tablet Commonly known as: CRESTOR Take 5 mg by mouth at bedtime.   tamsulosin 0.4 MG Caps capsule Commonly known as: FLOMAX TAKE 1 CAPSULE BY MOUTH EVERY DAY   vitamin B-12 1000 MCG tablet Commonly known as: CYANOCOBALAMIN Take 1,000 mcg by mouth daily.   Vitamin D3 50 MCG (2000 UT) capsule Take 2,000 Units by mouth daily.       Allergies: No Known Allergies  Family History: No family history on file.  Social History:  reports that she quit smoking about 10 years ago. Her smoking use included cigarettes. She has a  37.50 pack-year smoking history. She has never used smokeless tobacco. She reports that she does not drink alcohol or use drugs.  ROS: UROLOGY Frequent Urination?: No Hard to postpone urination?: No Burning/pain with urination?: No Get up at night to urinate?: No Leakage of urine?: Yes Urine stream starts and stops?: No Trouble starting stream?: No Do you have to strain to urinate?: No Blood in urine?: No Urinary tract infection?: No Sexually transmitted disease?: No Injury to kidneys or bladder?: No Painful intercourse?: No Weak stream?: No Currently pregnant?: No Vaginal bleeding?: No Last menstrual period?: n  Gastrointestinal Nausea?: No Vomiting?: No Indigestion/heartburn?: No Diarrhea?: No Constipation?: No  Constitutional Fever: No Night sweats?: No Weight loss?: No Fatigue?: No  Skin Skin rash/lesions?: No Itching?: No  Eyes Blurred vision?: No Double vision?: No  Ears/Nose/Throat Sore throat?: No Sinus problems?: No  Hematologic/Lymphatic Swollen glands?: No Easy bruising?: No  Cardiovascular Leg swelling?: No Chest pain?: No  Respiratory Cough?: No Shortness of breath?: No  Endocrine Excessive thirst?: No  Musculoskeletal Back pain?: No Joint pain?: Yes  Neurological Headaches?: No Dizziness?: No  Psychologic Depression?: No Anxiety?: No  Physical Exam: BP (!) 156/82   Pulse (!) 103  Ht 5\' 4"  (1.626 m)   Wt 176 lb (79.8 kg)   BMI 30.21 kg/m   Constitutional:  Alert and oriented, No acute distress.  Accompanied by husband today. HEENT: [place] AT, moist mucus membranes.  Trachea midline, no masses. Cardiovascular: No clubbing, cyanosis, or edema. Respiratory: Normal respiratory effort, no increased work of breathing. Skin: No rashes, bruises or suspicious lesions. Neurologic: Grossly intact, no focal deficits, moving all 4 extremities. Psychiatric: Normal mood and affect.  Laboratory Data: Lab Results  Component Value  Date   WBC 11.5 (H) 12/10/2018   HGB 14.3 12/10/2018   HCT 43.0 12/10/2018   MCV 91.7 12/10/2018   PLT 291 12/10/2018    Lab Results  Component Value Date   CREATININE 1.10 (H) 12/10/2018    Urinalysis    Component Value Date/Time   COLORURINE AMBER (A) 12/10/2018 1917   APPEARANCEUR HAZY (A) 12/10/2018 1917   APPEARANCEUR Clear 10/25/2018 1352   LABSPEC 1.008 12/10/2018 1917   PHURINE 6.0 12/10/2018 1917   GLUCOSEU NEGATIVE 12/10/2018 1917   HGBUR LARGE (A) 12/10/2018 1917   BILIRUBINUR NEGATIVE 12/10/2018 1917   BILIRUBINUR Negative 10/25/2018 1352   KETONESUR NEGATIVE 12/10/2018 1917   PROTEINUR 100 (A) 12/10/2018 1917   NITRITE NEGATIVE 12/10/2018 1917   LEUKOCYTESUR NEGATIVE 12/10/2018 1917    Lab Results  Component Value Date   LABMICR See below: 10/25/2018   WBCUA 0-5 10/25/2018   RBCUA >30R 06/08/2018   LABEPIT 0-10 10/25/2018   MUCUS Present 06/08/2018   BACTERIA NONE SEEN 12/10/2018    Pertinent Imaging: Results for orders placed during the hospital encounter of 01/02/19  US RENAL   Narrative CLINICAL DATA:  68 year old female, status post RIGHT ureteroscopy.  EXAM: RENAL / URINARY TRACT ULTRASOUND COMPLETE  COMPARISON:  12/10/2018 CT  FINDINGS: Right Kidney:  Renal measurements: 12.8 x 5.5 x 4.4 cm = volume: 160 mL. Echogenicity is within normal limits. There is no evidence of hydronephrosis. A 3.7 cm LOWER pole cyst is noted. No solid mass is identified.  Left Kidney:  Renal measurements: 10.1 x 4.7 x 4.4 cm = volume: 109 mL. Echogenicity within normal limits. No mass or hydronephrosis visualized. Nonobstructing calculi again noted.  Bladder:  Appears normal for degree of bladder distention.  IMPRESSION: 1. Interval resolution of RIGHT hydronephrosis since 12/10/2018 CT 2. LEFT renal calculi again noted. Known RIGHT renal calculi are difficult to visualize on this study.   Electronically Signed   By: Harmon PierJeffrey  Hu M.D.   On:  01/02/2019 13:24    Renal ultrasound was personally reviewed today, agree with radiologic interpretation.  Assessment & Plan:    1. Nephrolithiasis Status post successful right ureteroscopy Complete resolution of right hydronephrosis and stone burden Overall, she is minimal residual stone burden at this point Primary metabolic abnormality is very low urinary volume We had a lengthy discussion again today about increasing fluids, strategies for avoidance of Coke with substitution for either water or citrus-based low calorie drink She is going to try to pick up some crystallite packets Plan for follow-up in 1 year with KUB or sooner as needed   Return in about 1 year (around 01/04/2020) for MD follow up. with KUB  Vanna ScotlandAshley Karlisha Mathena, MD  [facility] [address], [address] [place], [place] [address] [phone]

## 2019-01-16 ENCOUNTER — Other Ambulatory Visit: Payer: Self-pay | Admitting: *Deleted

## 2019-01-16 MED ORDER — TAMSULOSIN HCL 0.4 MG PO CAPS: 0.4000 mg | ORAL_CAPSULE | Freq: Every day | ORAL | 0 refills | Status: DC

## 2019-03-20 ENCOUNTER — Other Ambulatory Visit: Payer: Self-pay | Admitting: Urology

## 2019-04-16 DIAGNOSIS — R002 Palpitations: Secondary | ICD-10-CM | POA: Insufficient documentation

## 2019-04-20 ENCOUNTER — Other Ambulatory Visit: Payer: Self-pay | Admitting: Urology

## 2019-04-20 NOTE — Telephone Encounter (Signed)
Does the pt need to continue flomax? Please advise thanks

## 2019-05-01 ENCOUNTER — Ambulatory Visit: Payer: Medicare Other | Admitting: Urology

## 2019-06-12 DIAGNOSIS — M8589 Other specified disorders of bone density and structure, multiple sites: Secondary | ICD-10-CM | POA: Insufficient documentation

## 2019-06-12 DIAGNOSIS — M25512 Pain in left shoulder: Secondary | ICD-10-CM | POA: Insufficient documentation

## 2019-06-19 DIAGNOSIS — E669 Obesity, unspecified: Secondary | ICD-10-CM | POA: Insufficient documentation

## 2019-06-19 DIAGNOSIS — E66811 Obesity, class 1: Secondary | ICD-10-CM | POA: Insufficient documentation

## 2019-07-24 DIAGNOSIS — N6320 Unspecified lump in the left breast, unspecified quadrant: Secondary | ICD-10-CM | POA: Insufficient documentation

## 2019-07-25 ENCOUNTER — Other Ambulatory Visit: Payer: Self-pay | Admitting: Gerontology

## 2019-07-25 DIAGNOSIS — Z1231 Encounter for screening mammogram for malignant neoplasm of breast: Secondary | ICD-10-CM

## 2019-07-25 DIAGNOSIS — R928 Other abnormal and inconclusive findings on diagnostic imaging of breast: Secondary | ICD-10-CM

## 2019-07-25 DIAGNOSIS — N6320 Unspecified lump in the left breast, unspecified quadrant: Secondary | ICD-10-CM

## 2019-08-03 ENCOUNTER — Ambulatory Visit
Admission: RE | Admit: 2019-08-03 | Discharge: 2019-08-03 | Disposition: A | Discharge status: Home / Self Care | Payer: Medicare PPO | Source: Ambulatory Visit | Attending: Gerontology | Admitting: Gerontology

## 2019-08-03 DIAGNOSIS — R928 Other abnormal and inconclusive findings on diagnostic imaging of breast: Secondary | ICD-10-CM

## 2019-08-03 DIAGNOSIS — N6320 Unspecified lump in the left breast, unspecified quadrant: Secondary | ICD-10-CM | POA: Diagnosis present

## 2019-08-03 DIAGNOSIS — Z1231 Encounter for screening mammogram for malignant neoplasm of breast: Secondary | ICD-10-CM

## 2020-01-07 NOTE — Progress Notes (Signed)
01/08/2020 10:03 AM   Sheryl [name] 1950/08/17 [id number]  Referring provider: Lorenso Quarry, NP [address] [place],  [place] [address] Chief Complaint  Patient presents with  . Nephrolithiasis    HPI: Sheryl [name] is a 69 y.o. female who returns for a 1 year follow up of nephrolithiasis.   Pt underwent right ureteroscopy on 12/04/2018 for 14 mm right lower pole stone.  Her postoperative course was complicated by 2 ER visits for ureteral spasm following stent removal.  Her pain ultimately improved.  She reports that she had a much harder time with ureteroscopy then with her previous PCNL which she was not anticipating.  Renal ultrasound on 12/2018 showed resolution of her hydronephrosis (seen on CT scan shortly following stent removal). All stones on the right are absent.  She does have a personal history of nephrolithiasis. Most recently, she underwent left PCNL followed by staged ureteroscopy with very small stone fragments residual. She also has a known right-sided nonobstructing stone.  Previous stone analysis showed 100% calcium oxalate monohydrate.  24-hour urine metabolic evaluation showedvery low urinary volume of 1.27 L over 24-hour period. In addition, her urinary oxalate was 38 mg/day, borderline hyperoxaluria. Her urinary pH was also low, 5.28. Urinary calcium low, 97 mg/day. Urinary citrate sufficient 757 mg/day. Creatinine 0.73. Serum calcium 9.5. Magnesium 1.9. CO2 26.  Today the patient is doing well. She reports poor fluid intake. She will normally take a sip of water every few hour. She is trying to increased her fluid intake.    PMH: Past Medical History:  Diagnosis Date  . Anxiety   . Arthritis    lower back, left hip   . Chicken pox   . Chronic kidney disease   . Complication of anesthesia   . Diabetes mellitus without complication (HCC)   . GERD (gastroesophageal reflux disease)   . History of hiatal hernia   . History of kidney  stones   . Hypercholesterolemia   . Hypertension   . Migraines    rare now  . Nephrolithiasis   . PONV (postoperative nausea and vomiting)    VOMITED A LITTLE BIT AFTER KIDNEY STONE SURGERY    Surgical History: Past Surgical History:  Procedure Laterality Date  . BREAST CYST ASPIRATION Left    negative 2012  . CHOLECYSTECTOMY    . COLONOSCOPY    . COLONOSCOPY, ESOPHAGOGASTRODUODENOSCOPY (EGD) AND ESOPHAGEAL DILATION    . CYSTOSCOPY W/ RETROGRADES Bilateral 12/04/2018   Procedure: CYSTOSCOPY WITH RETROGRADE PYELOGRAM;  Surgeon: Vanna Scotland, MD;  Location: [facility];  Service: Urology;  Laterality: Bilateral;  . CYSTOSCOPY/URETEROSCOPY/HOLMIUM LASER/STENT PLACEMENT Left 06/26/2018   Procedure: CYSTOSCOPY/URETEROSCOPY/HOLMIUM LASER/STENT Exchange;  Surgeon: Vanna Scotland, MD;  Location: [facility];  Service: Urology;  Laterality: Left;  . CYSTOSCOPY/URETEROSCOPY/HOLMIUM LASER/STENT PLACEMENT Right 12/04/2018   Procedure: CYSTOSCOPY/URETEROSCOPY/HOLMIUM LASER/STENT PLACEMENT;  Surgeon: Vanna Scotland, MD;  Location: [facility];  Service: Urology;  Laterality: Right;  . DISTAL BICEPS TENDON REPAIR Right 01/19/2017   Procedure: BICEPS TENODESIS;  Surgeon: Christena Flake, MD;  Location: [facility];  Service: Orthopedics;  Laterality: Right;  Diabetic - oral meds  . ESOPHAGOGASTRODUODENOSCOPY (EGD) WITH PROPOFOL N/A 01/31/2015   Procedure: ESOPHAGOGASTRODUODENOSCOPY (EGD) WITH PROPOFOL;  Surgeon: Scot Jun, MD;  Location: [facility] END[facility];  Service: Endoscopy;  Laterality: N/A;  . esophogastroduodenoscopy    . esophogastroduodeoscopy    . HERNIA REPAIR    . IR NEPHROSTOMY PLACEMENT LEFT  05/22/2018  . NEPHROLITHOTOMY Left 05/22/2018   Procedure: NEPHROLITHOTOMY  PERCUTANEOUS;  Surgeon: Vanna Scotland, MD;  Location: [facility];  Service: Urology;  Laterality: Left;  . SAVORY DILATION N/A 01/31/2015   Procedure: SAVORY DILATION;  Surgeon: Scot Jun, MD;  [facility];  Service: Endoscopy;  Laterality: N/A;  . SHOULDER ARTHROSCOPY Right 01/19/2017   Procedure: ARTHROSCOPY SHOULDER DEBRIDEMENT DECOMPRESSION AND  ROTATOR CUFF REPAIR AND BICEPS TENODESIS;  Surgeon: Christena Flake, MD;  Location: [facility];  Service: Orthopedics;  Laterality: Right;    Home Medications:  Allergies as of 01/08/2020   No Known Allergies     Medication List       Accurate as of January 08, 2020 10:03 AM. If you have any questions, ask your nurse or doctor.        STOP taking these medications   citalopram 20 MG tablet Commonly known as: CELEXA Stopped by: Vanna Scotland, MD     TAKE these medications   lisinopril 5 MG tablet Commonly known as: ZESTRIL Take 5 mg by mouth every morning.   lisinopril-hydrochlorothiazide 20-25 MG tablet Commonly known as: ZESTORETIC Take by mouth.   metFORMIN 500 MG tablet Commonly known as: GLUCOPHAGE Take 500 mg by mouth daily with breakfast.   Pepcid AC 10 MG tablet Generic drug: famotidine Take 10 mg by mouth every other day.   potassium chloride 8 MEQ tablet Commonly known as: KLOR-CON Take 1 tablet twice daily for the next 3 days, then decrease to 1 tablet daily.   rosuvastatin 5 MG tablet Commonly known as: CRESTOR Take 5 mg by mouth at bedtime.   sertraline 100 MG tablet Commonly known as: ZOLOFT Take by mouth.   tamsulosin 0.4 MG Caps capsule Commonly known as: FLOMAX TAKE 1 CAPSULE BY MOUTH EVERY DAY   vitamin B-12 1000 MCG tablet Commonly known as: CYANOCOBALAMIN Take 1,000 mcg by mouth daily.   Vitamin D3 50 MCG (2000 UT) capsule Take 2,000 Units by mouth daily.       Allergies: No Known Allergies  Family History: No family history on file.  Social History:  reports that she quit smoking about 11 years ago. Her smoking use included cigarettes. She has a 37.50 pack-year smoking history. She has never used smokeless tobacco. She reports that she does not drink alcohol and does  not use drugs.   Physical Exam: BP (!) 154/82 (BP Location: Left Arm, Patient Position: Sitting, Cuff Size: Large)   Pulse 99   Ht 5' 3.5" (1.613 m)   Wt 186 lb (84.4 kg)   BMI 32.43 kg/m   Constitutional:  Alert and oriented, No acute distress. HEENT: [place] AT, moist mucus membranes.  Trachea midline, no masses. Cardiovascular: No clubbing, cyanosis, or edema. Respiratory: Normal respiratory effort, no increased work of breathing. Skin: No rashes, bruises or suspicious lesions. Neurologic: Grossly intact, no focal deficits, moving all 4 extremities. Psychiatric: Normal mood and affect.   Pertinent Imaging: KUB today is fairly unremarkable with no stone burden. There are a few stones on the left that appear stable from CT in 2020. No new right side stone burden.   Above is my personal interpretation.  KUB was personally reviewed and compared to previous CT scan.  Awaiting final read by radiology.   Assessment & Plan:    1. Nephrolithiasis Doing well without any significant stone burden, small punctate stones on left without interval development of new stone on right over the past year  She continues to have issues with food intake and urinary volume.  This  is seen on previous metabolic analysis.  Encouraged to continue to to increase fluids with a goal urine output of 2.5 L.  RTC in 2 years with KUB.    Patient is agreeable to plan.  Return in about 2 years (around 01/07/2022) for Follow up KUB . or sooner as needed  [facility] [address], [address] [place], [place] [address] [phone]  I, Theador Hawthorne, am acting as a scribe for Dr. Vanna Scotland.  I have reviewed the above documentation for accuracy and completeness, and I agree with the above.   Vanna Scotland, MD

## 2020-01-08 ENCOUNTER — Ambulatory Visit (INDEPENDENT_AMBULATORY_CARE_PROVIDER_SITE_OTHER): Payer: Medicare PPO | Admitting: Urology

## 2020-01-08 ENCOUNTER — Ambulatory Visit
Admission: RE | Admit: 2020-01-08 | Discharge: 2020-01-08 | Disposition: A | Discharge status: Home / Self Care | Payer: Medicare PPO | Attending: Urology | Admitting: Urology

## 2020-01-08 ENCOUNTER — Encounter: Payer: Self-pay | Admitting: Urology

## 2020-01-08 ENCOUNTER — Ambulatory Visit
Admission: RE | Admit: 2020-01-08 | Discharge: 2020-01-08 | Disposition: A | Discharge status: Home / Self Care | Payer: Medicare PPO | Source: Ambulatory Visit | Attending: Urology | Admitting: Urology

## 2020-01-08 ENCOUNTER — Other Ambulatory Visit: Payer: Self-pay | Admitting: Family Medicine

## 2020-01-08 ENCOUNTER — Other Ambulatory Visit: Payer: Self-pay

## 2020-01-08 VITALS — BP 154/82 | HR 99 | Ht 63.5 in | Wt 186.0 lb

## 2020-01-08 DIAGNOSIS — N200 Calculus of kidney: Secondary | ICD-10-CM

## 2020-05-19 IMAGING — XA IR NEPHROSTOMY PLACEMENT LEFT
9 series · 10 of 10 positions shown · non-contrast
Comparison: CT of the abdomen and pelvis on 03/16/2018

CLINICAL DATA: Left renal calculus with partial staghorn morphology
occupying the renal pelvis and extending into the lower pole
collecting system. Percutaneous catheter access is now performed
prior to scheduled operative percutaneous nephrolithotomy later
today.

EXAM:
1. ULTRASOUND GUIDANCE FOR PUNCTURE OF THE LEFT RENAL COLLECTING
SYSTEM.
2. LEFT PERCUTANEOUS NEPHROSTOMY/URETERAL CATHETER PLACEMENT.

[Series 1: fl - angio · 1 of 1 slices shown (1 of 9)]
[im 1/1]
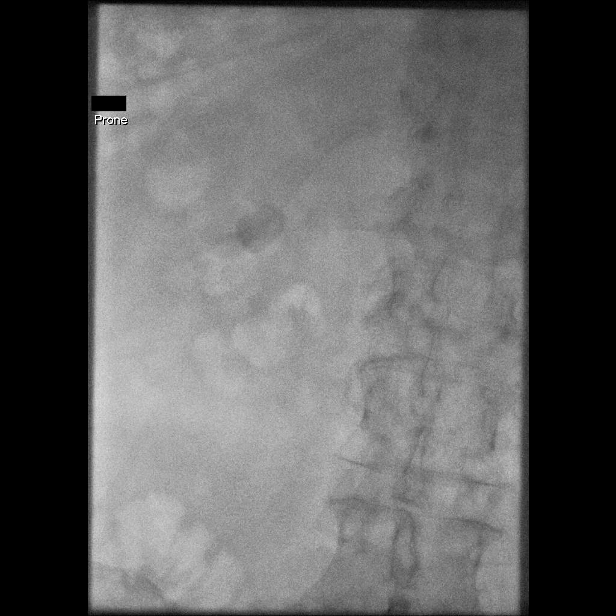

[Series 2: fl - angio · 1 of 1 slices shown (2 of 9)]
[im 1/1]
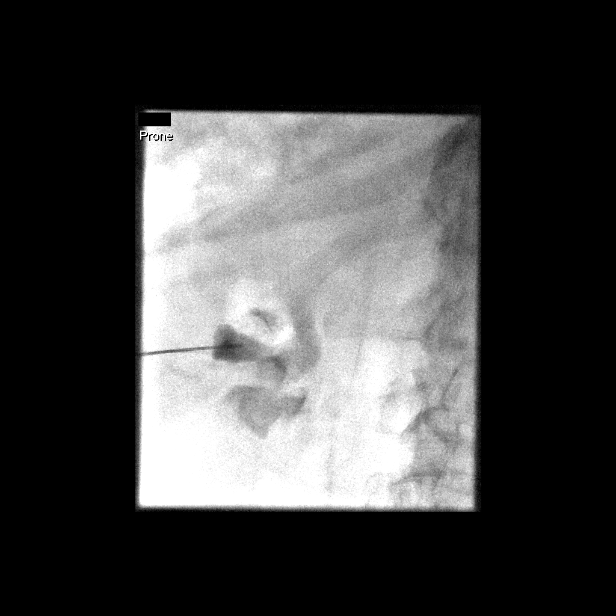

[Series 3: fl - angio · 1 of 1 slices shown (3 of 9)]
[im 1/1]
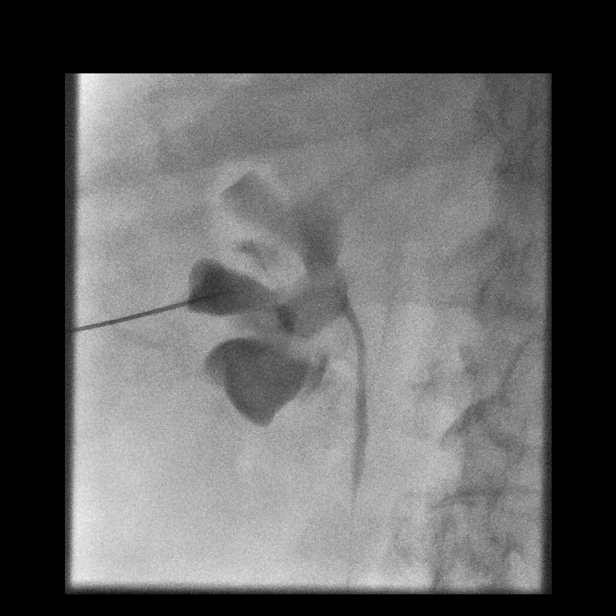

[Series 4: fl - angio · 2 of 2 slices shown (4 of 9)]
[im 1/2]
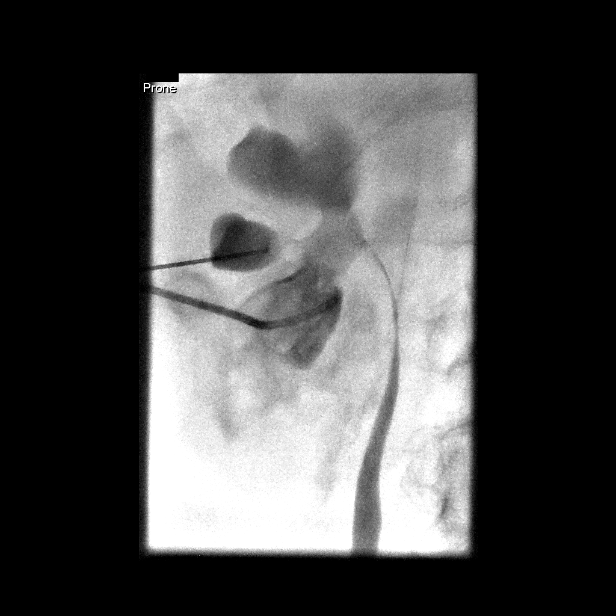
[im 2/2]
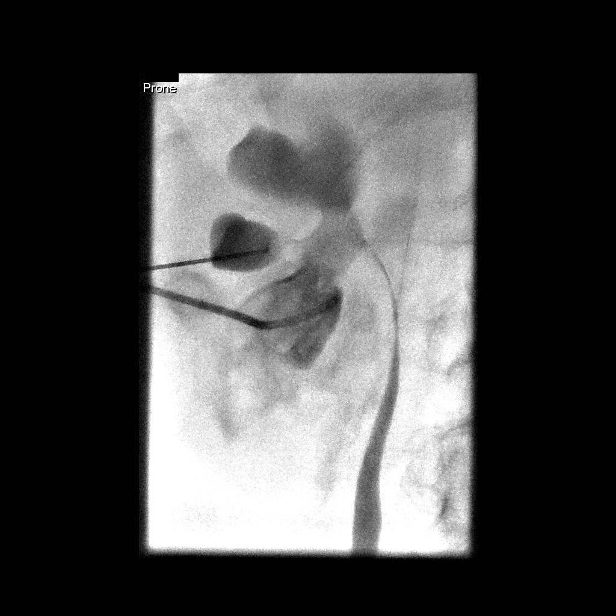

[Series 5: fl - angio · 1 of 1 slices shown (5 of 9)]
[im 1/1]
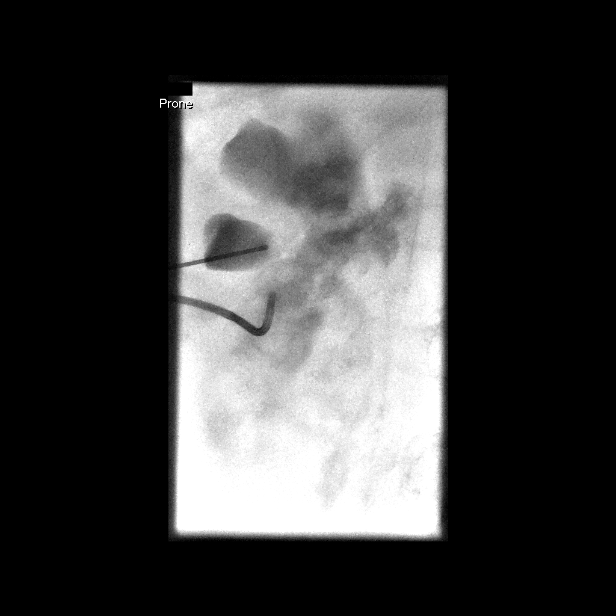

[Series 6: fl - angio · 1 of 1 slices shown (6 of 9)]
[im 1/1]
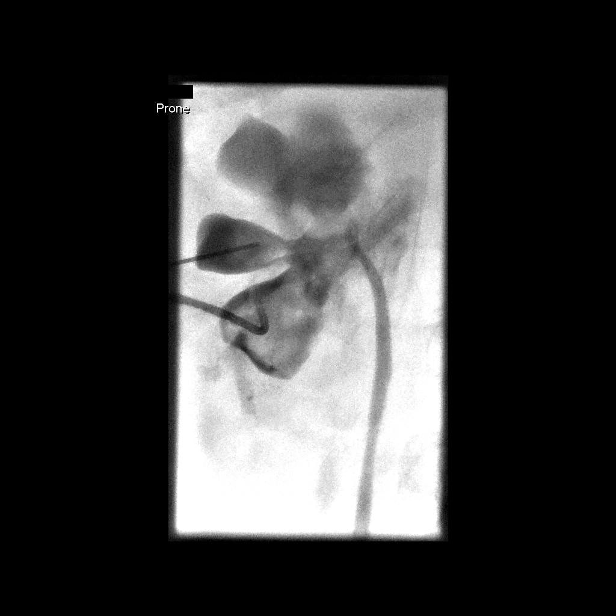

[Series 7: fl - angio · 1 of 1 slices shown (7 of 9)]
[im 1/1]
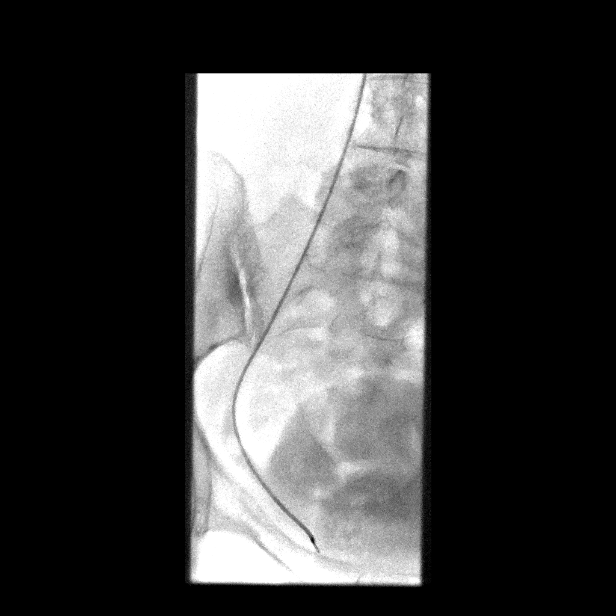

[Series 8: fl - angio · 1 of 1 slices shown (8 of 9)]
[im 1/1]
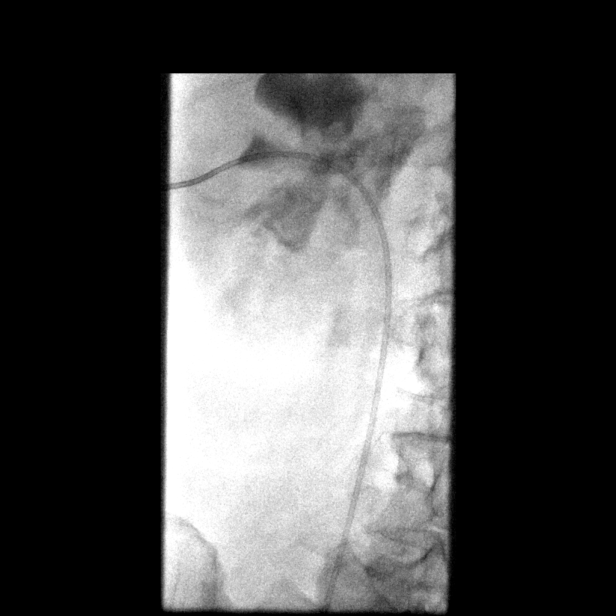

[Series 9: fl - angio · 1 of 1 slices shown (9 of 9)]
[im 1/1]
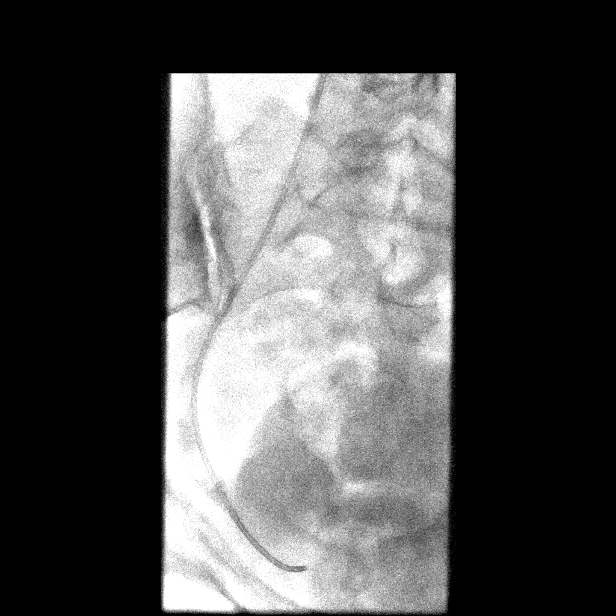

[10 of 10 positions shown; findings below may reference images not displayed]

ANESTHESIA/SEDATION:
3.0 mg IV Versed; 100 mcg IV Fentanyl.

Total Moderate Sedation Time

54 minutes.

The patient's level of consciousness and physiologic status were
continuously monitored during the procedure by Radiology nursing.

CONTRAST:  30 mL [id number]

MEDICATIONS:
2 g IV Ancef. Antibiotic was administered in an appropriate time
frame prior to skin puncture.

FLUOROSCOPY TIME:  19 minutes and 24 seconds.  956 mGy.

PROCEDURE:
The procedure, risks, benefits, and alternatives were explained to
the patient. Questions regarding the procedure were encouraged and
answered. The patient understands and consents to the procedure. A
time-out was performed prior to initiating the procedure.

The left flank region was initialed and than prepped with
chlorhexidine in a sterile fashion. A sterile drape was applied
covering the operative field. A sterile gown and sterile gloves were
used for the procedure. Local anesthesia was provided with 1%
Lidocaine.

Initial attempt was performed to access the inferior portion
radiopaque calculus in the lower pole collecting system with a 21
gauge needle under fluoroscopic guidance. After making some passes
with the 21 gauge needle, aspiration was performed and a small
amount of contrast injected to try to obtain access to a lower pole
calyx.

Ultrasound was then used to localize the left kidney. Under direct
ultrasound guidance, a 21 gauge needle was advanced into the renal
collecting system at the level of an interpolar calyx. Ultrasound
image documentation was performed. Aspiration of urine sample was
performed followed by contrast injection. This needle access was
preserved.

A second 21 gauge needle was then advanced under fluoroscopy to
puncture a lower pole calyx after contrast opacification. After
puncture of the calyx, a transitional dilator was placed over a
0.018 inch guidewire. A 5 French catheter was then advanced over a
wire into the lower pole collecting system. Attempt was made to
advance the catheter over a hydrophilic guidewire.

A guidewire was then advanced through the 21 gauge needle used to
access and interpolar calyx. A transitional dilator was placed. A 5
French catheter was then advanced over a guidewire. The catheter was
further advanced over a hydrophilic wire into the ureter and down to
the level of the bladder. Final catheter positioning was confirmed
by fluoroscopic imaging. The catheter was secured at the skin with a
Prolene retention suture and overlying Tegaderm dressing.

COMPLICATIONS:
None.
FINDINGS: Initial fluoroscopy demonstrates a calculus occupying the left renal
pelvis with additional calculi extending into the lower pole
collecting system. With attempted access of the lower pole
collecting system under fluoroscopy, there was inability to gain
access into the collecting system to be able to advance a guidewire.

Under ultrasound guidance, there was ability to successfully
puncture and interpolar calyx which allowed opacification of the
collecting system. This demonstrates contracture of the renal pelvis
around the dominant calculus and calculi extending into the lower
pole infundibulum.

After contrast opacification and distention of the collecting
system, there was ability to gain access to the lower pole
collecting system. After advancing a 5 French catheter into a lower
pole calyx, the catheter could not be advanced further through the
lower pole infundibulum due to an impacted calculus.

The interpolar access was therefore utilized in ultimately placing a
5 French catheter through the renal pelvis and down the ureter to
the level of the bladder. This access will be utilized during
nephrolithotomy.
IMPRESSION: Percutaneous access of the left kidney with placement of a 5 French
catheter via interpolar access down the ureter and into the bladder.
This access will be utilized for operative percutaneous
nephrolithotomy later today. Initial attempts at obtaining lower
pole access were unsuccessful due to impacted calculus at the level
of the lower pole infundibulum.

## 2020-06-11 ENCOUNTER — Other Ambulatory Visit: Payer: Self-pay

## 2020-06-11 ENCOUNTER — Encounter: Payer: Self-pay | Admitting: Gastroenterology

## 2020-06-11 ENCOUNTER — Ambulatory Visit: Payer: Medicare PPO | Admitting: Gastroenterology

## 2020-06-11 VITALS — BP 122/74 | HR 87 | Temp 97.9°F | Ht 63.5 in | Wt 182.2 lb

## 2020-06-11 DIAGNOSIS — R1319 Other dysphagia: Secondary | ICD-10-CM

## 2020-06-11 NOTE — Progress Notes (Signed)
Gastroenterology Consultation  Referring Provider:     Lorenso Quarry, NP Primary Care Physician:  Lorenso Quarry, NP Primary Gastroenterologist:  Dr. Servando Snare     Reason for Consultation:     Dysphagia        HPI:   Sheryl FRANKEN is a 70 y.o. y/o female referred for consultation & management of dysphagia by Dr. Lorenso Quarry, NP.  This patient comes in today after being seen by Dr. Mechele Collin in the past with a upper endoscopy in 2016 and 2018 per care everywhere.  In Dr. Earnest Conroy note it reported that the patient had a Schatzki's ring with improvement after dilation of the Schatzki's ring.  The patient is now being sent for evaluation to me because of dysphagia.  Since her gastroenterologist had retired she had seen her primary care provider and requested to see me. The patient reports that she has gone longer than she usually does prior to getting evaluated due to multiple things going on in her family and with her husband.  The patient denies any unexplained weight loss fevers chills nausea or vomiting.  The patient also reports that she had a colonoscopy approximately 5 years ago and reports that she was told she does not need another colonoscopy for 10 years.  She denies any black stools or bloody stools or hematemesis.  Past Medical History:  Diagnosis Date  . Anxiety   . Arthritis    lower back, left hip   . Chicken pox   . Chronic kidney disease   . Complication of anesthesia   . Diabetes mellitus without complication (HCC)   . GERD (gastroesophageal reflux disease)   . History of hiatal hernia   . History of kidney stones   . Hypercholesterolemia   . Hypertension   . Migraines    rare now  . Nephrolithiasis   . PONV (postoperative nausea and vomiting)    VOMITED A LITTLE BIT AFTER KIDNEY STONE SURGERY    Past Surgical History:  Procedure Laterality Date  . BREAST CYST ASPIRATION Left    negative 2012  . CHOLECYSTECTOMY    . COLONOSCOPY    . COLONOSCOPY,  ESOPHAGOGASTRODUODENOSCOPY (EGD) AND ESOPHAGEAL DILATION    . CYSTOSCOPY W/ RETROGRADES Bilateral 12/04/2018   Procedure: CYSTOSCOPY WITH RETROGRADE PYELOGRAM;  Surgeon: Vanna Scotland, MD;  Location: [facility];  Service: Urology;  Laterality: Bilateral;  . CYSTOSCOPY/URETEROSCOPY/HOLMIUM LASER/STENT PLACEMENT Left 06/26/2018   Procedure: CYSTOSCOPY/URETEROSCOPY/HOLMIUM LASER/STENT Exchange;  Surgeon: Vanna Scotland, MD;  Location: [facility];  Service: Urology;  Laterality: Left;  . CYSTOSCOPY/URETEROSCOPY/HOLMIUM LASER/STENT PLACEMENT Right 12/04/2018   Procedure: CYSTOSCOPY/URETEROSCOPY/HOLMIUM LASER/STENT PLACEMENT;  Surgeon: Vanna Scotland, MD;  Location: [facility];  Service: Urology;  Laterality: Right;  . DISTAL BICEPS TENDON REPAIR Right 01/19/2017   Procedure: BICEPS TENODESIS;  Surgeon: Christena Flake, MD;  Location: [facility];  Service: Orthopedics;  Laterality: Right;  Diabetic - oral meds  . ESOPHAGOGASTRODUODENOSCOPY (EGD) WITH PROPOFOL N/A 01/31/2015   Procedure: ESOPHAGOGASTRODUODENOSCOPY (EGD) WITH PROPOFOL;  Surgeon: Scot Jun, MD;  Location: [facility] END[facility];  Service: Endoscopy;  Laterality: N/A;  . esophogastroduodenoscopy    . esophogastroduodeoscopy    . HERNIA REPAIR    . IR NEPHROSTOMY PLACEMENT LEFT  05/22/2018  . NEPHROLITHOTOMY Left 05/22/2018   Procedure: NEPHROLITHOTOMY PERCUTANEOUS;  Surgeon: Vanna Scotland, MD;  Location: [facility];  Service: Urology;  Laterality: Left;  . SAVORY DILATION N/A 01/31/2015   Procedure: SAVORY DILATION;  Surgeon: Scot Jun, MD;  Location: [facility] ENDOSCOPY;  Service: Endoscopy;  Laterality: N/A;  . SHOULDER ARTHROSCOPY Right 01/19/2017   Procedure: ARTHROSCOPY SHOULDER DEBRIDEMENT DECOMPRESSION AND  ROTATOR CUFF REPAIR AND BICEPS TENODESIS;  Surgeon: Christena Flake, MD;  Location: [facility];  Service: Orthopedics;  Laterality: Right;    Prior to Admission medications   Medication Sig Start Date End Date Taking?  Authorizing Provider  Cholecalciferol (VITAMIN D3) 50 MCG (2000 UT) capsule Take 2,000 Units by mouth daily.     [provider]  famotidine (PEPCID AC) 10 MG tablet Take 10 mg by mouth every other day.     [provider]  lisinopril (PRINIVIL,ZESTRIL) 5 MG tablet Take 5 mg by mouth every morning.     [provider]  lisinopril-hydrochlorothiazide (ZESTORETIC) 20-25 MG tablet Take by mouth. 12/14/19 12/13/20  [provider]  metFORMIN (GLUCOPHAGE) 500 MG tablet Take 500 mg by mouth daily with breakfast.     [provider]  potassium chloride (KLOR-CON) 8 MEQ tablet Take 1 tablet twice daily for the next 3 days, then decrease to 1 tablet daily. 01/01/20   [provider]  rosuvastatin (CRESTOR) 5 MG tablet Take 5 mg by mouth at bedtime.     [provider]  sertraline (ZOLOFT) 100 MG tablet Take by mouth. 12/14/19 12/13/20  [provider]  tamsulosin (FLOMAX) 0.4 MG CAPS capsule TAKE 1 CAPSULE BY MOUTH EVERY DAY 03/21/19   Vanna Scotland, MD  vitamin B-12 (CYANOCOBALAMIN) 1000 MCG tablet Take 1,000 mcg by mouth daily.    [provider]    No family history on file.   Social History   Tobacco Use  . Smoking status: Former Smoker    Packs/day: 1.50    Years: 25.00    Pack years: 37.50    Types: Cigarettes    Quit date: 2010    Years since quitting: 12.1  . Smokeless tobacco: Never Used  Vaping Use  . Vaping Use: Never used  Substance Use Topics  . Alcohol use: No  . Drug use: No    Allergies as of 06/11/2020  . (No Known Allergies)    Review of Systems:    All systems reviewed and negative except where noted in HPI.   Physical Exam:  There were no vitals taken for this visit. No LMP recorded. Patient is postmenopausal. General:   Alert,  Well-developed, well-nourished, pleasant and cooperative in NAD Head:  Normocephalic and atraumatic. Eyes:  Sclera clear, no icterus.   Conjunctiva  pink. Ears:  Normal auditory acuity. Neck:  Supple; no masses or thyromegaly. Lungs:  Respirations even and unlabored.  Clear throughout to auscultation.   No wheezes, crackles, or rhonchi. No acute distress. Heart:  Regular rate and rhythm; no murmurs, clicks, rubs, or gallops. Abdomen:  Normal bowel sounds.  No bruits.  Soft, non-tender and non-distended without masses, hepatosplenomegaly or hernias noted.  No guarding or rebound tenderness.  Negative Carnett sign.   Rectal:  Deferred.  Pulses:  Normal pulses noted. Extremities:  No clubbing or edema.  No cyanosis. Neurologic:  Alert and oriented x3;  grossly normal neurologically. Skin:  Intact without significant lesions or rashes.  No jaundice. Lymph Nodes:  No significant cervical adenopathy. Psych:  Alert and cooperative. Normal mood and affect.  Imaging Studies: No results found.  Assessment and Plan:   COLLETTA SPILLERS is a 70 y.o. y/o female who comes in today with a history of dysphagia.  The patient has had dilations in the past during her  upper endoscopies.  The patient will be set up for repeat upper endoscopy since she is having dysphagia to solids at the present time.  She is not have any dysphagia to liquids nor does she have any worry symptoms such as unexplained weight loss or black stools.  The patient has been explained the plan and agrees with it.    Midge Minium, MD. Clementeen Graham    Note: This dictation was prepared with Dragon dictation along with smaller phrase technology. Any transcriptional errors that result from this process are unintentional.

## 2020-06-13 ENCOUNTER — Other Ambulatory Visit: Payer: Self-pay

## 2020-06-13 DIAGNOSIS — R1319 Other dysphagia: Secondary | ICD-10-CM

## 2020-06-17 ENCOUNTER — Other Ambulatory Visit
Admission: RE | Admit: 2020-06-17 | Discharge: 2020-06-17 | Disposition: A | Discharge status: Home / Self Care | Payer: Medicare PPO | Source: Ambulatory Visit | Attending: Gastroenterology | Admitting: Gastroenterology

## 2020-06-17 ENCOUNTER — Other Ambulatory Visit: Payer: Self-pay

## 2020-06-17 DIAGNOSIS — Z01812 Encounter for preprocedural laboratory examination: Secondary | ICD-10-CM | POA: Insufficient documentation

## 2020-06-17 DIAGNOSIS — Z20822 Contact with and (suspected) exposure to covid-19: Secondary | ICD-10-CM | POA: Diagnosis not present

## 2020-06-17 LAB — SARS CORONAVIRUS 2 (TAT 6-24 HRS): SARS Coronavirus 2: NEGATIVE

## 2020-06-19 ENCOUNTER — Ambulatory Visit: Payer: Medicare PPO | Admitting: Registered Nurse

## 2020-06-19 ENCOUNTER — Encounter
Admission: RE | Disposition: A | Discharge status: Home / Self Care | Payer: Self-pay | Source: Home / Self Care | Attending: Gastroenterology

## 2020-06-19 ENCOUNTER — Ambulatory Visit
Admission: RE | Admit: 2020-06-19 | Discharge: 2020-06-19 | Disposition: A | Discharge status: Home / Self Care | Payer: Medicare PPO | Attending: Gastroenterology | Admitting: Gastroenterology

## 2020-06-19 ENCOUNTER — Encounter: Payer: Self-pay | Admitting: Gastroenterology

## 2020-06-19 ENCOUNTER — Other Ambulatory Visit: Payer: Self-pay

## 2020-06-19 DIAGNOSIS — E1122 Type 2 diabetes mellitus with diabetic chronic kidney disease: Secondary | ICD-10-CM | POA: Diagnosis not present

## 2020-06-19 DIAGNOSIS — K2970 Gastritis, unspecified, without bleeding: Secondary | ICD-10-CM

## 2020-06-19 DIAGNOSIS — I129 Hypertensive chronic kidney disease with stage 1 through stage 4 chronic kidney disease, or unspecified chronic kidney disease: Secondary | ICD-10-CM | POA: Insufficient documentation

## 2020-06-19 DIAGNOSIS — K219 Gastro-esophageal reflux disease without esophagitis: Secondary | ICD-10-CM | POA: Insufficient documentation

## 2020-06-19 DIAGNOSIS — Z79899 Other long term (current) drug therapy: Secondary | ICD-10-CM | POA: Insufficient documentation

## 2020-06-19 DIAGNOSIS — K222 Esophageal obstruction: Secondary | ICD-10-CM

## 2020-06-19 DIAGNOSIS — N189 Chronic kidney disease, unspecified: Secondary | ICD-10-CM | POA: Insufficient documentation

## 2020-06-19 DIAGNOSIS — K449 Diaphragmatic hernia without obstruction or gangrene: Secondary | ICD-10-CM | POA: Insufficient documentation

## 2020-06-19 DIAGNOSIS — R1319 Other dysphagia: Secondary | ICD-10-CM

## 2020-06-19 DIAGNOSIS — R1310 Dysphagia, unspecified: Secondary | ICD-10-CM | POA: Diagnosis present

## 2020-06-19 DIAGNOSIS — Z7984 Long term (current) use of oral hypoglycemic drugs: Secondary | ICD-10-CM | POA: Insufficient documentation

## 2020-06-19 DIAGNOSIS — K319 Disease of stomach and duodenum, unspecified: Secondary | ICD-10-CM | POA: Insufficient documentation

## 2020-06-19 DIAGNOSIS — Z87891 Personal history of nicotine dependence: Secondary | ICD-10-CM | POA: Diagnosis not present

## 2020-06-19 HISTORY — PX: ESOPHAGOGASTRODUODENOSCOPY (EGD) WITH PROPOFOL: SHX5813

## 2020-06-19 LAB — GLUCOSE, CAPILLARY: Glucose-Capillary: 139 mg/dL — ABNORMAL HIGH (ref 70–99)

## 2020-06-19 SURGERY — ESOPHAGOGASTRODUODENOSCOPY (EGD) WITH PROPOFOL
Anesthesia: General

## 2020-06-19 MED ORDER — LIDOCAINE HCL (CARDIAC) PF 100 MG/5ML IV SOSY
PREFILLED_SYRINGE | INTRAVENOUS | Status: DC | PRN
  Administered 2020-06-19: 100 mg via INTRAVENOUS

## 2020-06-19 MED ORDER — PROPOFOL 500 MG/50ML IV EMUL
INTRAVENOUS | Status: AC
  Filled 2020-06-19: qty 50

## 2020-06-19 MED ORDER — SODIUM CHLORIDE 0.9 % IV SOLN: INTRAVENOUS | Status: DC

## 2020-06-19 MED ORDER — PROPOFOL 500 MG/50ML IV EMUL
INTRAVENOUS | Status: DC | PRN
  Administered 2020-06-19: 150 ug/kg/min via INTRAVENOUS

## 2020-06-19 MED ORDER — GLYCOPYRROLATE 0.2 MG/ML IJ SOLN
INTRAMUSCULAR | Status: DC | PRN
  Administered 2020-06-19: .2 mg via INTRAVENOUS

## 2020-06-19 MED ORDER — PROPOFOL 10 MG/ML IV BOLUS
INTRAVENOUS | Status: DC | PRN
  Administered 2020-06-19: 70 mg via INTRAVENOUS

## 2020-06-19 NOTE — Transfer of Care (Signed)
Immediate Anesthesia Transfer of Care Note  Patient: Sheryl [name]  Procedure(s) Performed: ESOPHAGOGASTRODUODENOSCOPY (EGD) WITH PROPOFOL (N/A )  Patient Location: Endoscopy Unit  Anesthesia Type:General  Level of Consciousness: drowsy  Airway & Oxygen Therapy: Patient Spontanous Breathing  Post-op Assessment: Report given to RN and Post -op Vital signs reviewed and stable  Post vital signs: Reviewed and stable  Last Vitals: see Epic record Vitals Value Taken Time  BP    Temp    Pulse    Resp    SpO2      Last Pain:  Vitals:   06/19/20 0848  TempSrc: Temporal  PainSc: 0-No pain         Complications: No complications documented.

## 2020-06-19 NOTE — Anesthesia Preprocedure Evaluation (Signed)
Anesthesia Evaluation  Patient identified by MRN, date of birth, ID band  Reviewed: Allergy & Precautions, NPO status , Patient's Chart, lab work & pertinent test results  History of Anesthesia Complications (+) PONVNegative for: history of anesthetic complications  Airway Mallampati: III       Dental   Pulmonary neg sleep apnea, neg COPD, Not current smoker, former smoker,           Cardiovascular hypertension, Pt. on medications (-) Past MI and (-) CHF (-) dysrhythmias (-) Valvular Problems/Murmurs     Neuro/Psych  Headaches, neg Seizures Anxiety    GI/Hepatic Neg liver ROS, hiatal hernia, GERD  Medicated,  Endo/Other  diabetes, Type 2, Oral Hypoglycemic Agents  Renal/GU Renal disease (stones)     Musculoskeletal  (+) Arthritis , Osteoarthritis,    Abdominal   Peds negative pediatric ROS (+)  Hematology   Anesthesia Other Findings Past Medical History: No date: Anxiety No date: Arthritis     Comment:  lower back, left hip  No date: Chicken pox No date: Chronic kidney disease No date: Complication of anesthesia No date: Diabetes mellitus without complication (HCC) No date: GERD (gastroesophageal reflux disease) No date: History of hiatal hernia No date: History of kidney stones No date: Hypercholesterolemia No date: Hypertension No date: Migraines     Comment:  rare now No date: Nephrolithiasis No date: PONV (postoperative nausea and vomiting)     Comment:  VOMITED A LITTLE BIT AFTER KIDNEY STONE SURGERY  Reproductive/Obstetrics                             Anesthesia Physical  Anesthesia Plan  ASA: III  Anesthesia Plan: General   Post-op Pain Management:    Induction: Intravenous  PONV Risk Score and Plan: Propofol infusion  Airway Management Planned: Nasal Cannula  Additional Equipment:   Intra-op Plan:   Post-operative Plan:   Informed Consent: I have reviewed  the patients History and Physical, chart, labs and discussed the procedure including the risks, benefits and alternatives for the proposed anesthesia with the patient or authorized representative who has indicated his/her understanding and acceptance.     Dental advisory given  Plan Discussed with: CRNA and Surgeon  Anesthesia Plan Comments:         Anesthesia Quick Evaluation

## 2020-06-19 NOTE — Anesthesia Postprocedure Evaluation (Signed)
Anesthesia Post Note  Patient: Sheryl [name]  Procedure(s) Performed: ESOPHAGOGASTRODUODENOSCOPY (EGD) WITH PROPOFOL (N/A )  Patient location during evaluation: Endoscopy Anesthesia Type: General Level of consciousness: awake and alert and oriented Pain management: pain level controlled Vital Signs Assessment: post-procedure vital signs reviewed and stable Respiratory status: spontaneous breathing Cardiovascular status: blood pressure returned to baseline Anesthetic complications: no   No complications documented.   Last Vitals:  Vitals:   06/19/20 0953 06/19/20 1003  BP: 124/67 138/79  Pulse: 85 80  Resp: 13 20  Temp:    SpO2: 97% 97%    Last Pain:  Vitals:   06/19/20 1003  TempSrc:   PainSc: 0-No pain                 Hlee Fringer

## 2020-06-19 NOTE — H&P (Signed)
Midge Minium, MD [facility] [address] [place], [place] [address] [phone] Fax : [phone]  Primary Care Physician:  Lorenso Quarry, NP Primary Gastroenterologist:  Dr. Servando Snare  Pre-Procedure History & Physical: HPI:  Sheryl [name] is a 70 y.o. female is here for an endoscopy.   Past Medical History:  Diagnosis Date  . Anxiety   . Arthritis    lower back, left hip   . Chicken pox   . Chronic kidney disease   . Complication of anesthesia   . Diabetes mellitus without complication (HCC)   . GERD (gastroesophageal reflux disease)   . History of hiatal hernia   . History of kidney stones   . Hypercholesterolemia   . Hypertension   . Migraines    rare now  . Nephrolithiasis   . PONV (postoperative nausea and vomiting)    VOMITED A LITTLE BIT AFTER KIDNEY STONE SURGERY    Past Surgical History:  Procedure Laterality Date  . BREAST CYST ASPIRATION Left    negative 2012  . CHOLECYSTECTOMY    . COLONOSCOPY    . COLONOSCOPY, ESOPHAGOGASTRODUODENOSCOPY (EGD) AND ESOPHAGEAL DILATION    . CYSTOSCOPY W/ RETROGRADES Bilateral 12/04/2018   Procedure: CYSTOSCOPY WITH RETROGRADE PYELOGRAM;  Surgeon: Vanna Scotland, MD;  Location: [facility];  Service: Urology;  Laterality: Bilateral;  . CYSTOSCOPY/URETEROSCOPY/HOLMIUM LASER/STENT PLACEMENT Left 06/26/2018   Procedure: CYSTOSCOPY/URETEROSCOPY/HOLMIUM LASER/STENT Exchange;  Surgeon: Vanna Scotland, MD;  Location: [facility];  Service: Urology;  Laterality: Left;  . CYSTOSCOPY/URETEROSCOPY/HOLMIUM LASER/STENT PLACEMENT Right 12/04/2018   Procedure: CYSTOSCOPY/URETEROSCOPY/HOLMIUM LASER/STENT PLACEMENT;  Surgeon: Vanna Scotland, MD;  Location: [facility];  Service: Urology;  Laterality: Right;  . DISTAL BICEPS TENDON REPAIR Right 01/19/2017   Procedure: BICEPS TENODESIS;  Surgeon: Christena Flake, MD;  Location: [facility];  Service: Orthopedics;  Laterality: Right;  Diabetic - oral meds  . ESOPHAGOGASTRODUODENOSCOPY  (EGD) WITH PROPOFOL N/A 01/31/2015   Procedure: ESOPHAGOGASTRODUODENOSCOPY (EGD) WITH PROPOFOL;  Surgeon: Scot Jun, MD;  Location: [facility];  Service: Endoscopy;  Laterality: N/A;  . esophogastroduodenoscopy    . esophogastroduodeoscopy    . HERNIA REPAIR    . IR NEPHROSTOMY PLACEMENT LEFT  05/22/2018  . NEPHROLITHOTOMY Left 05/22/2018   Procedure: NEPHROLITHOTOMY PERCUTANEOUS;  Surgeon: Vanna Scotland, MD;  Location: [facility];  Service: Urology;  Laterality: Left;  . SAVORY DILATION N/A 01/31/2015   Procedure: SAVORY DILATION;  Surgeon: Scot Jun, MD;  Location: [facility] END[facility]COPY;  Service: Endoscopy;  Laterality: N/A;  . SHOULDER ARTHROSCOPY Right 01/19/2017   Procedure: ARTHROSCOPY SHOULDER DEBRIDEMENT DECOMPRESSION AND  ROTATOR CUFF REPAIR AND BICEPS TENODESIS;  Surgeon: Christena Flake, MD;  Location: [facility];  Service: Orthopedics;  Laterality: Right;    Prior to Admission medications   Medication Sig Start Date End Date Taking? Authorizing Provider  lisinopril-hydrochlorothiazide (ZESTORETIC) 20-25 MG tablet Take by mouth. 12/14/19 12/13/20 Yes [provider]  Cholecalciferol (VITAMIN D3) 50 MCG (2000 UT) capsule Take 2,000 Units by mouth daily.  Patient not taking: Reported on 06/19/2020    [provider]  famotidine (PEPCID) 10 MG tablet Take 10 mg by mouth every other day.     [provider]  lisinopril (PRINIVIL,ZESTRIL) 5 MG tablet Take 5 mg by mouth every morning.     [provider]  metFORMIN (GLUCOPHAGE) 500 MG tablet Take 500 mg by mouth 2 (two) times daily with a meal.    [provider]  potassium chloride (KLOR-CON) 8 MEQ tablet Take 1 tablet  twice daily for the next 3 days, then decrease to 1 tablet daily. 01/01/20   [provider]  rosuvastatin (CRESTOR) 5 MG tablet Take 5 mg by mouth at bedtime.     [provider]  sertraline (ZOLOFT) 100 MG tablet Take by mouth. 12/14/19 12/13/20   [provider]  tamsulosin (FLOMAX) 0.4 MG CAPS capsule TAKE 1 CAPSULE BY MOUTH EVERY DAY Patient not taking: Reported on 06/19/2020 03/21/19   Vanna Scotland, MD  vitamin B-12 (CYANOCOBALAMIN) 1000 MCG tablet Take 1,000 mcg by mouth daily.    [provider]    Allergies as of 06/13/2020  . (No Known Allergies)    History reviewed. No pertinent family history.  Social History   Socioeconomic History  . Marital status: Married    Spouse name: Not on file  . Number of children: Not on file  . Years of education: Not on file  . Highest education level: Not on file  Occupational History  . Not on file  Tobacco Use  . Smoking status: Former Smoker    Packs/day: 1.50    Years: 25.00    Pack years: 37.50    Types: Cigarettes    Quit date: 2010    Years since quitting: 12.2  . Smokeless tobacco: Never Used  Vaping Use  . Vaping Use: Never used  Substance and Sexual Activity  . Alcohol use: No  . Drug use: No  . Sexual activity: Yes    Birth control/protection: Post-menopausal  Other Topics Concern  . Not on file  Social History Narrative  . Not on file   Social Determinants of Health   Financial Resource Strain: Not on file  Food Insecurity: Not on file  Transportation Needs: Not on file  Physical Activity: Not on file  Stress: Not on file  Social Connections: Not on file  Intimate Partner Violence: Not on file    Review of Systems: See HPI, otherwise negative ROS  Physical Exam: BP 137/81   Pulse 81   Temp (!) 96.1 F (35.6 C) (Temporal)   Resp 16   Ht 5' 3.5" (1.613 m)   Wt 82.1 kg   SpO2 98%   BMI 31.56 kg/m  General:   Alert,  pleasant and cooperative in NAD Head:  Normocephalic and atraumatic. Neck:  Supple; no masses or thyromegaly. Lungs:  Clear throughout to auscultation.    Heart:  Regular rate and rhythm. Abdomen:  Soft, nontender and nondistended. Normal bowel sounds, without guarding, and without rebound.   Neurologic:   Alert and  oriented x4;  grossly normal neurologically.  Impression/Plan: Sheryl [name] is here for an endoscopy to be performed for dysphagia  Risks, benefits, limitations, and alternatives regarding  endoscopy have been reviewed with the patient.  Questions have been answered.  All parties agreeable.   Midge Minium, MD  06/19/2020, 9:17 AM

## 2020-06-19 NOTE — Op Note (Signed)
[facility] Gastroenterology Patient Name: Sheryl [name] Procedure Date: 06/19/2020 9:24 AM MRN: [id number] Account #: [id number] Date of Birth: 12/31/1950 Admit Type: Outpatient Age: 70 Room: [facility] ENDO ROOM 4 Gender: Female Note Status: Finalized Procedure:             Upper GI endoscopy Indications:           Dysphagia Providers:             Midge Minium MD, MD Referring MD:          Lorenso Quarry (Referring MD) Medicines:             Propofol per Anesthesia Complications:         No immediate complications. Procedure:             Pre-Anesthesia Assessment:                        - Prior to the procedure, a History and Physical was                         performed, and patient medications and allergies were                         reviewed. The patient's tolerance of previous                         anesthesia was also reviewed. The risks and benefits                         of the procedure and the sedation options and risks                         were discussed with the patient. All questions were                         answered, and informed consent was obtained. Prior                         Anticoagulants: The patient has taken no previous                         anticoagulant or antiplatelet agents. ASA Grade                         Assessment: II - A patient with mild systemic disease.                         After reviewing the risks and benefits, the patient                         was deemed in satisfactory condition to undergo the                         procedure.                        After obtaining informed consent, the endoscope was  passed under direct vision. Throughout the procedure,                         the patient's blood pressure, pulse, and oxygen                         saturations were monitored continuously. The Endoscope                         was introduced through the mouth, and advanced to the                          second part of duodenum. The upper GI endoscopy was                         accomplished without difficulty. The patient tolerated                         the procedure well. Findings:      One benign-appearing, intrinsic moderate stenosis was found at the       gastroesophageal junction. The stenosis was traversed. A TTS dilator was       passed through the scope. Dilation with a 12-13.5-15 mm balloon dilator       was performed to 15 mm. The dilation site was examined following       endoscope reinsertion and showed moderate improvement in luminal       narrowing.      A medium-sized hiatal hernia was present.      Localized moderate inflammation characterized by erythema was found in       the gastric antrum. Biopsies were taken with a cold forceps for       histology.      The examined duodenum was normal. Impression:            - Benign-appearing esophageal stenosis. Dilated.                        - Medium-sized hiatal hernia.                        - Gastritis. Biopsied.                        - Normal examined duodenum. Recommendation:        - Discharge patient to home.                        - Resume previous diet.                        - Continue present medications.                        - Await pathology results. Procedure Code(s):     --- Professional ---                        [phone], Esophagogastroduodenoscopy, flexible,                         transoral; with transendoscopic balloon dilation of  esophagus (less than 30 mm diameter)                        43239, 59, Esophagogastroduodenoscopy, flexible,                         transoral; with biopsy, single or multiple Diagnosis Code(s):     --- Professional ---                        R13.10, Dysphagia, unspecified                        K29.70, Gastritis, unspecified, without bleeding                        K22.2, Esophageal obstruction CPT copyright 2019 American Medical  Association. All rights reserved. The codes documented in this report are preliminary and upon coder review may  be revised to meet current compliance requirements. Midge Minium MD, MD 06/19/2020 9:40:39 AM This report has been signed electronically. Number of Addenda: 0 Note Initiated On: 06/19/2020 9:24 AM Estimated Blood Loss:  Estimated blood loss: none.      [facility]

## 2020-06-20 ENCOUNTER — Encounter: Payer: Self-pay | Admitting: Gastroenterology

## 2020-06-20 LAB — SURGICAL PATHOLOGY

## 2020-06-23 ENCOUNTER — Encounter: Payer: Self-pay | Admitting: Gastroenterology

## 2020-11-14 IMAGING — CR ABDOMEN - 1 VIEW
2 series · 2 of 2 positions shown · non-contrast
Comparison: CT abdomen June 01, 2018, plain film March 08, 2018

CLINICAL DATA: 68-year-old female with a history of surgery and
lithotripsy

EXAM:
ABDOMEN - 1 VIEW

[abdomen kub (1 of 2)]
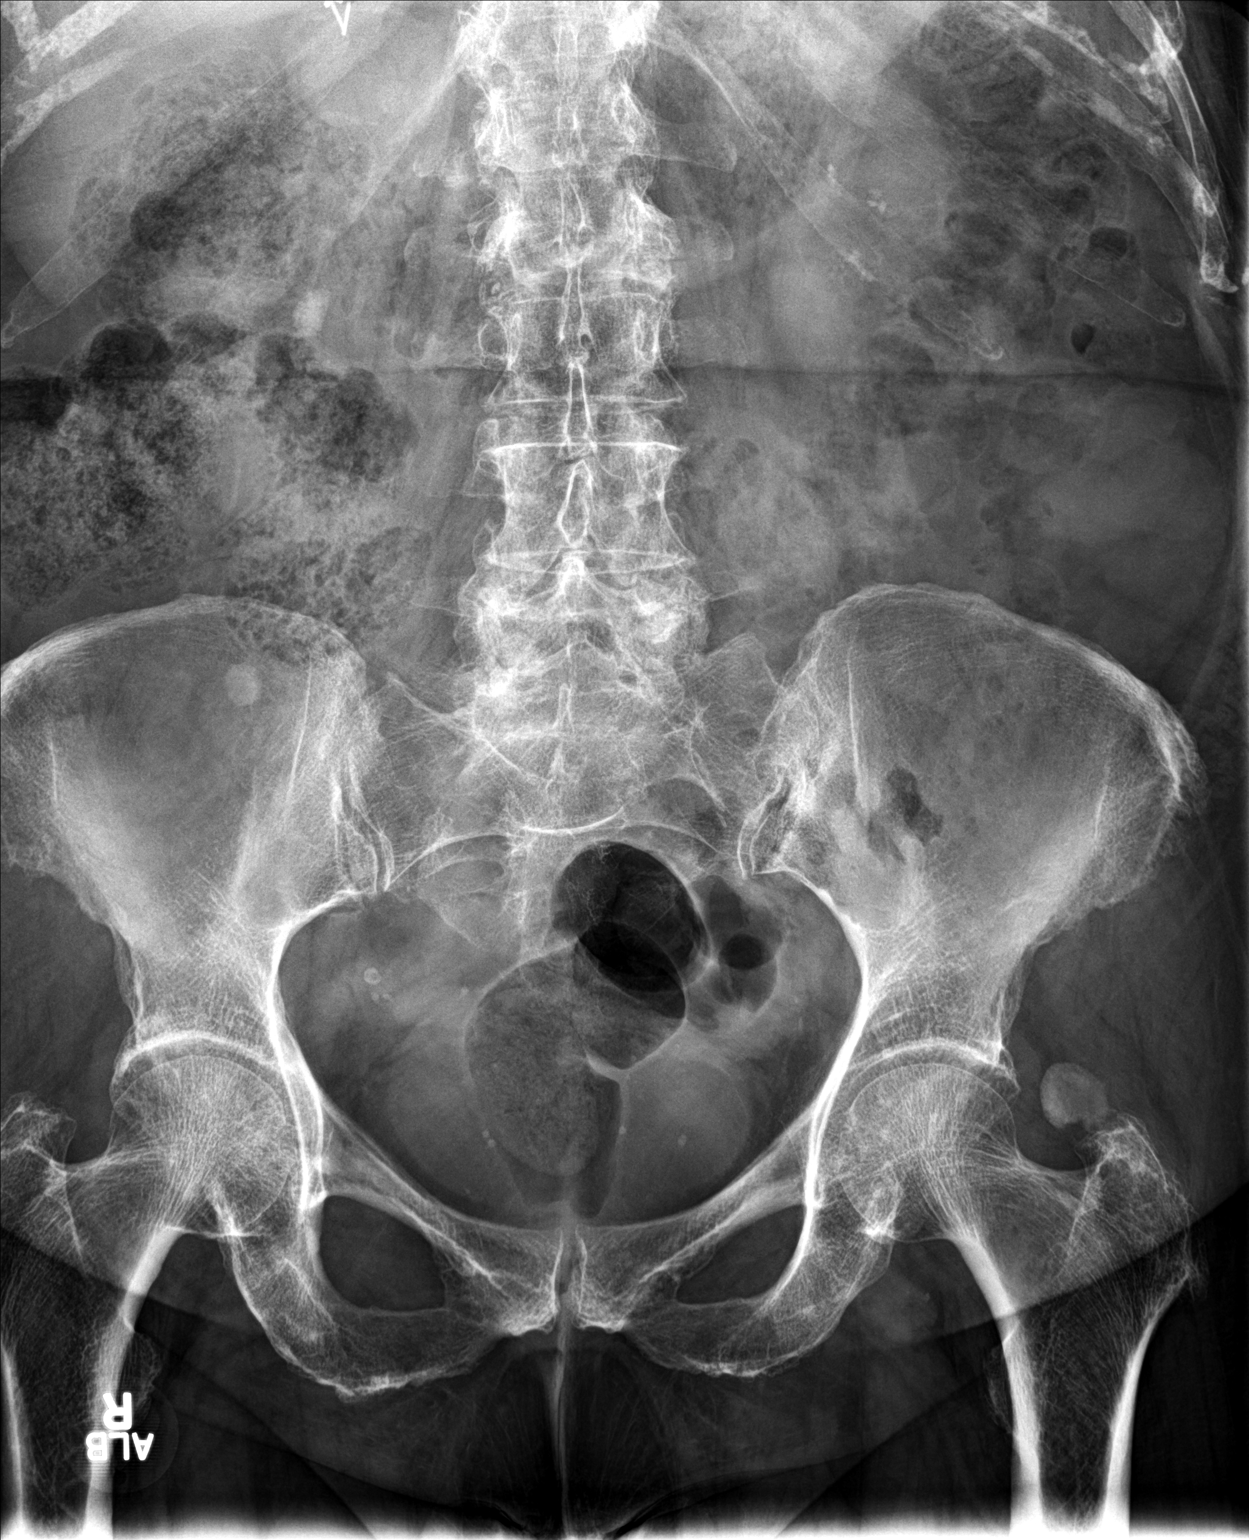

[abdomen kub (2 of 2)]
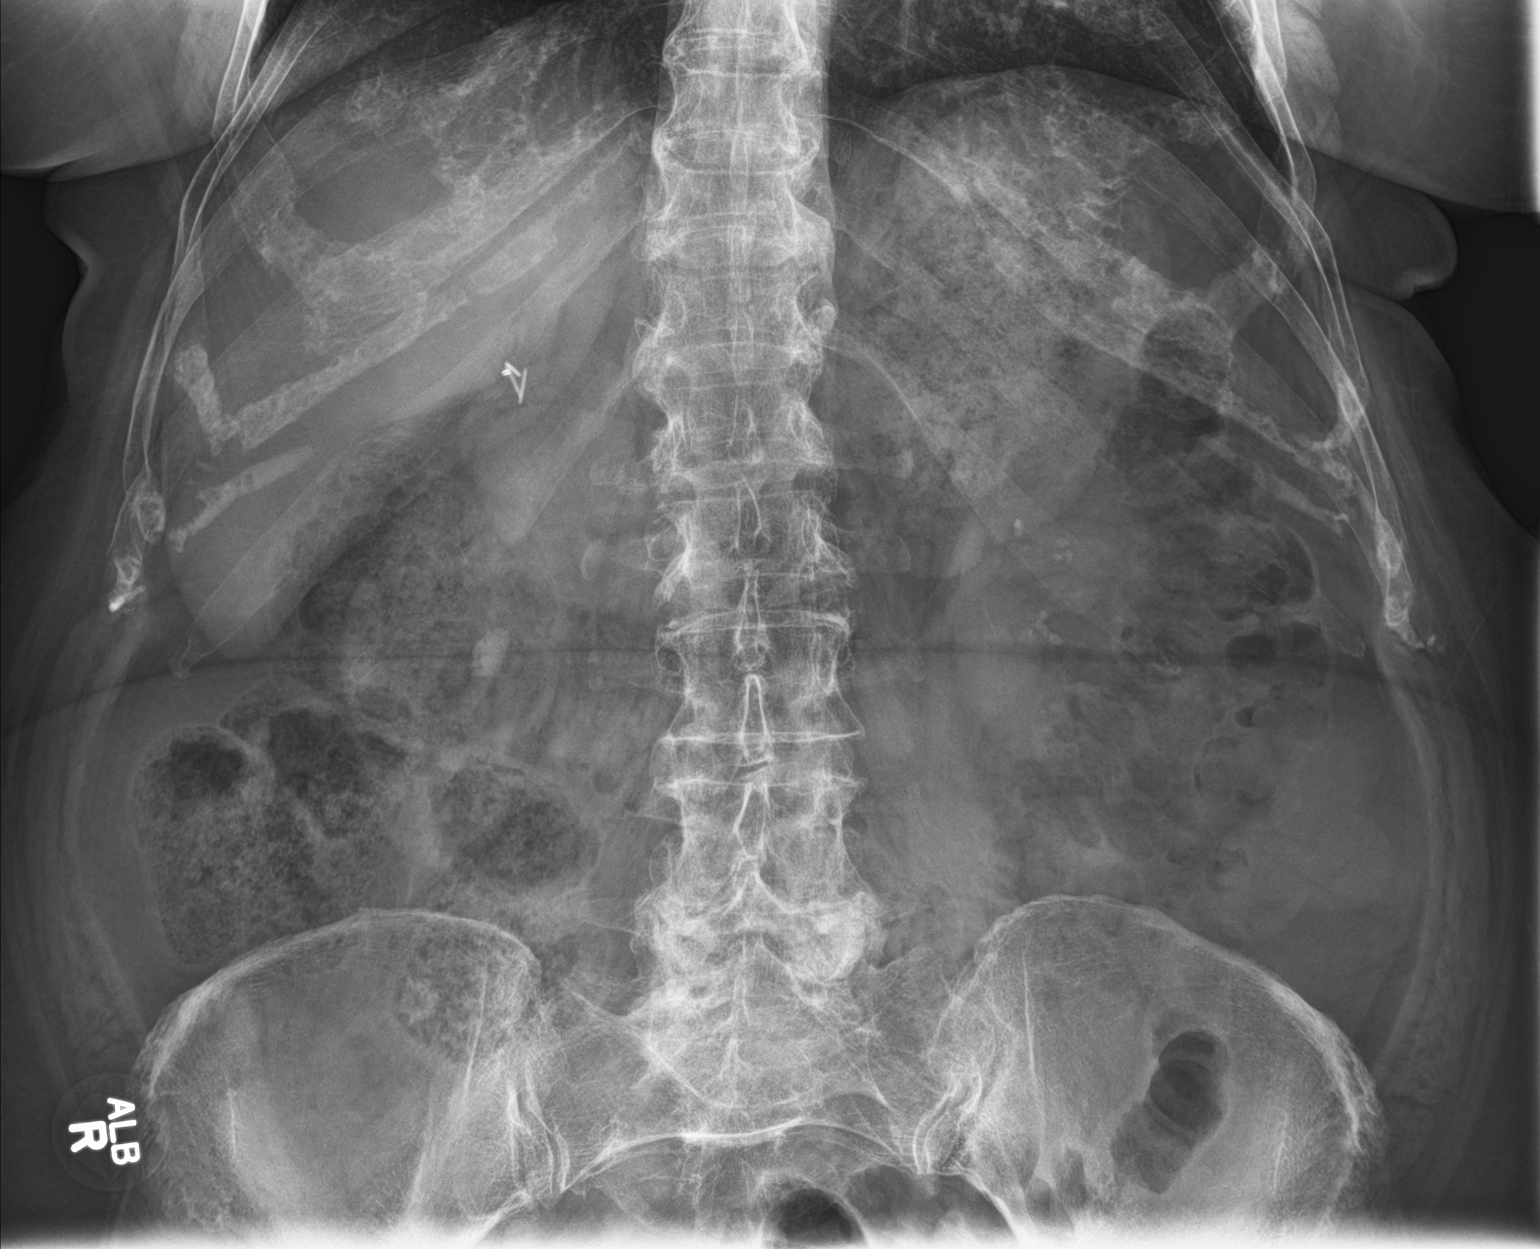

[2 of 2 positions shown; findings below may reference images not displayed]

FINDINGS: Gas within stomach small bowel and colon.  No abnormal distention.

Cholecystectomy.

Rounded calcification projecting over the right renal silhouette,
compatible with findings on recent CT.

Punctate calcification projecting over the left renal silhouette,
compatible with prior findings on CT. Since the CT there has been
removal of the left ureteral stent.

Degenerative changes of the spine. Degenerative changes of the hips.
IMPRESSION: Plain-film diagnosis of bilateral nephrolithiasis, with similar
appearance to the CT of May 2018.

Interval removal of left-sided ureteral stent.

## 2020-11-19 ENCOUNTER — Ambulatory Visit (INDEPENDENT_AMBULATORY_CARE_PROVIDER_SITE_OTHER): Payer: Medicare PPO | Admitting: Gastroenterology

## 2020-11-19 ENCOUNTER — Other Ambulatory Visit: Payer: Self-pay

## 2020-11-19 ENCOUNTER — Encounter: Payer: Self-pay | Admitting: Gastroenterology

## 2020-11-19 VITALS — BP 128/72 | HR 98 | Temp 97.4°F | Ht 63.5 in | Wt 166.8 lb

## 2020-11-19 DIAGNOSIS — R1319 Other dysphagia: Secondary | ICD-10-CM

## 2020-11-19 DIAGNOSIS — R197 Diarrhea, unspecified: Secondary | ICD-10-CM

## 2020-11-19 MED ORDER — NA SULFATE-K SULFATE-MG SULF 17.5-3.13-1.6 GM/177ML PO SOLN: 1.0000 | ORAL | 0 refills | Status: DC

## 2020-11-19 NOTE — Progress Notes (Signed)
Primary Care Physician: Lorenso Quarry, NP  Primary Gastroenterologist:  Dr. Midge Minium  Chief Complaint  Patient presents with  . Diarrhea    Nausea    HPI: Sheryl [name] is a 70 y.o. female here after seeing me back in March for dysphagia.  At that time she was set up for an upper endoscopy and a stricture was seen and dilated.  The dilation was up to 15 mm at the time of the upper endoscopy.  The patient was now sent for evaluation for nausea and diarrhea. The patient reports that she has diarrhea in the morning and nausea.  She also reports that this diarrhea is different than the postprandial diarrhea she's had since having her gallbladder out.  The patient has not had a colonoscopy many years and states that was last done by Dr. Mechele Collin.  Past Medical History:  Diagnosis Date  . Anxiety   . Arthritis    lower back, left hip   . Chicken pox   . Chronic kidney disease   . Complication of anesthesia   . Diabetes mellitus without complication (HCC)   . GERD (gastroesophageal reflux disease)   . History of hiatal hernia   . History of kidney stones   . Hypercholesterolemia   . Hypertension   . Migraines    rare now  . Nephrolithiasis   . PONV (postoperative nausea and vomiting)    VOMITED A LITTLE BIT AFTER KIDNEY STONE SURGERY    Current Outpatient Medications  Medication Sig Dispense Refill  . famotidine (PEPCID) 10 MG tablet Take 10 mg by mouth every other day.     . lisinopril (PRINIVIL,ZESTRIL) 5 MG tablet Take 5 mg by mouth every morning.     Marland Kitchen lisinopril-hydrochlorothiazide (ZESTORETIC) 20-25 MG tablet Take by mouth.    . metFORMIN (GLUCOPHAGE) 500 MG tablet Take 500 mg by mouth 2 (two) times daily with a meal.    . potassium chloride (KLOR-CON) 8 MEQ tablet Take 1 tablet twice daily for the next 3 days, then decrease to 1 tablet daily.    . rosuvastatin (CRESTOR) 5 MG tablet Take 5 mg by mouth at bedtime.     . sertraline (ZOLOFT) 100 MG tablet Take by  mouth.    . vitamin B-12 (CYANOCOBALAMIN) 1000 MCG tablet Take 1,000 mcg by mouth daily.    . Cholecalciferol (VITAMIN D3) 50 MCG (2000 UT) capsule Take 2,000 Units by mouth daily.  (Patient not taking: No sig reported)    . Na Sulfate-K Sulfate-Mg Sulf (SUPREP BOWEL PREP KIT) 17.5-3.13-1.6 GM/177ML SOLN Take 1 kit by mouth as directed. 354 mL 0  . tamsulosin (FLOMAX) 0.4 MG CAPS capsule TAKE 1 CAPSULE BY MOUTH EVERY DAY (Patient not taking: No sig reported) 30 capsule 0   No current facility-administered medications for this visit.    Allergies as of 11/19/2020  . (No Known Allergies)    ROS:  General: Negative for anorexia, weight loss, fever, chills, fatigue, weakness. ENT: Negative for hoarseness, difficulty swallowing , nasal congestion. CV: Negative for chest pain, angina, palpitations, dyspnea on exertion, peripheral edema.  Respiratory: Negative for dyspnea at rest, dyspnea on exertion, cough, sputum, wheezing.  GI: See history of present illness. GU:  Negative for dysuria, hematuria, urinary incontinence, urinary frequency, nocturnal urination.  Endo: Negative for unusual weight change.    Physical Examination:   BP 128/72 (BP Location: Left Arm, Patient Position: Sitting, Cuff Size: Large)   Pulse 98   Temp (!) 97.4  F (36.3 C) (Temporal)   Ht 5' 3.5" (1.613 m)   Wt 166 lb 12.8 oz (75.7 kg)   BMI 29.08 kg/m   General: Well-nourished, well-developed in no acute distress.  Eyes: No icterus. Conjunctivae pink. Neuro: Alert and oriented x 3.  Grossly intact. Skin: Warm and dry, no jaundice.   Psych: Alert and cooperative, normal mood and affect.  Labs:    Imaging Studies: No results found.  Assessment and Plan:   Sheryl [name] is a 70 y.o. y/o female who comes in today with a history of dysphagia and states that it is happening more with solids than liquids.  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 The patient will be set up for an EGD  and colonoscopy due to the diarrhea and nausea with dysphagia.  The patient has been explained the plan and agrees with it.     Lucilla Lame, MD. Marval Regal    Note: This dictation was prepared with Dragon dictation along with smaller phrase technology. Any transcriptional errors that result from this process are unintentional.

## 2020-11-21 ENCOUNTER — Other Ambulatory Visit: Payer: Self-pay

## 2020-11-21 DIAGNOSIS — R1319 Other dysphagia: Secondary | ICD-10-CM

## 2020-11-21 DIAGNOSIS — R197 Diarrhea, unspecified: Secondary | ICD-10-CM

## 2020-11-21 MED ORDER — PEG 3350-KCL-NA BICARB-NACL 420 G PO SOLR: ORAL | 0 refills | Status: DC

## 2020-12-24 ENCOUNTER — Encounter: Payer: Self-pay | Admitting: Gastroenterology

## 2020-12-25 ENCOUNTER — Ambulatory Visit: Payer: Medicare PPO | Admitting: Certified Registered"

## 2020-12-25 ENCOUNTER — Encounter
Admission: RE | Disposition: A | Discharge status: Home / Self Care | Payer: Self-pay | Source: Home / Self Care | Attending: Gastroenterology

## 2020-12-25 ENCOUNTER — Ambulatory Visit
Admission: RE | Admit: 2020-12-25 | Discharge: 2020-12-25 | Disposition: A | Discharge status: Home / Self Care | Payer: Medicare PPO | Attending: Gastroenterology | Admitting: Gastroenterology

## 2020-12-25 ENCOUNTER — Encounter: Payer: Self-pay | Admitting: Gastroenterology

## 2020-12-25 ENCOUNTER — Other Ambulatory Visit: Payer: Self-pay

## 2020-12-25 DIAGNOSIS — K449 Diaphragmatic hernia without obstruction or gangrene: Secondary | ICD-10-CM | POA: Insufficient documentation

## 2020-12-25 DIAGNOSIS — R1319 Other dysphagia: Secondary | ICD-10-CM | POA: Diagnosis not present

## 2020-12-25 DIAGNOSIS — K222 Esophageal obstruction: Secondary | ICD-10-CM | POA: Diagnosis not present

## 2020-12-25 DIAGNOSIS — R1310 Dysphagia, unspecified: Secondary | ICD-10-CM | POA: Insufficient documentation

## 2020-12-25 DIAGNOSIS — K5730 Diverticulosis of large intestine without perforation or abscess without bleeding: Secondary | ICD-10-CM | POA: Insufficient documentation

## 2020-12-25 DIAGNOSIS — K2980 Duodenitis without bleeding: Secondary | ICD-10-CM | POA: Insufficient documentation

## 2020-12-25 DIAGNOSIS — K2970 Gastritis, unspecified, without bleeding: Secondary | ICD-10-CM | POA: Diagnosis not present

## 2020-12-25 DIAGNOSIS — R197 Diarrhea, unspecified: Secondary | ICD-10-CM

## 2020-12-25 DIAGNOSIS — K219 Gastro-esophageal reflux disease without esophagitis: Secondary | ICD-10-CM | POA: Insufficient documentation

## 2020-12-25 DIAGNOSIS — K529 Noninfective gastroenteritis and colitis, unspecified: Secondary | ICD-10-CM | POA: Insufficient documentation

## 2020-12-25 DIAGNOSIS — K648 Other hemorrhoids: Secondary | ICD-10-CM | POA: Diagnosis not present

## 2020-12-25 HISTORY — PX: ESOPHAGOGASTRODUODENOSCOPY (EGD) WITH PROPOFOL: SHX5813

## 2020-12-25 HISTORY — DX: Other complications of anesthesia, initial encounter: T88.59XA

## 2020-12-25 HISTORY — PX: COLONOSCOPY WITH PROPOFOL: SHX5780

## 2020-12-25 LAB — GLUCOSE, CAPILLARY: Glucose-Capillary: 116 mg/dL — ABNORMAL HIGH (ref 70–99)

## 2020-12-25 SURGERY — COLONOSCOPY WITH PROPOFOL
Anesthesia: General

## 2020-12-25 MED ORDER — PROPOFOL 10 MG/ML IV BOLUS
INTRAVENOUS | Status: DC | PRN
  Administered 2020-12-25: 100 mg via INTRAVENOUS

## 2020-12-25 MED ORDER — PROPOFOL 500 MG/50ML IV EMUL
INTRAVENOUS | Status: DC | PRN
  Administered 2020-12-25: 100 ug/kg/min via INTRAVENOUS

## 2020-12-25 MED ORDER — SODIUM CHLORIDE 0.9 % IV SOLN: INTRAVENOUS | Status: DC

## 2020-12-25 MED ORDER — GLYCOPYRROLATE 0.2 MG/ML IJ SOLN
INTRAMUSCULAR | Status: DC | PRN
  Administered 2020-12-25: .2 mg via INTRAVENOUS

## 2020-12-25 MED ORDER — PROPOFOL 500 MG/50ML IV EMUL
INTRAVENOUS | Status: AC
  Filled 2020-12-25: qty 50

## 2020-12-25 MED ORDER — LIDOCAINE HCL (PF) 2 % IJ SOLN
INTRAMUSCULAR | Status: AC
  Filled 2020-12-25: qty 5

## 2020-12-25 MED ORDER — LIDOCAINE HCL (CARDIAC) PF 100 MG/5ML IV SOSY
PREFILLED_SYRINGE | INTRAVENOUS | Status: DC | PRN
  Administered 2020-12-25: 100 mg via INTRAVENOUS

## 2020-12-25 NOTE — Anesthesia Preprocedure Evaluation (Addendum)
Anesthesia Evaluation  Patient identified by MRN, date of birth, ID band Patient awake    Reviewed: Allergy & Precautions, NPO status , Patient's Chart, lab work & pertinent test results  History of Anesthesia Complications (+) PONV and history of anesthetic complications (bradycardia)  Airway Mallampati: II  TM Distance: >3 FB Neck ROM: Full    Dental   Pulmonary neg pulmonary ROS, former smoker,    Pulmonary exam normal        Cardiovascular hypertension, Normal cardiovascular exam     Neuro/Psych  Headaches, Anxiety negative psych ROS   GI/Hepatic Neg liver ROS, hiatal hernia, GERD  ,  Endo/Other  negative endocrine ROSdiabetes, Type 2  Renal/GU Kidney stones  negative genitourinary   Musculoskeletal  (+) Arthritis ,   Abdominal   Peds negative pediatric ROS (+)  Hematology negative hematology ROS (+)   Anesthesia Other Findings 11/2018 MAC 3 grade 1 view  Reproductive/Obstetrics negative OB ROS                            Anesthesia Physical Anesthesia Plan  ASA: 3  Anesthesia Plan: General   Post-op Pain Management:    Induction:   PONV Risk Score and Plan:   Airway Management Planned: Natural Airway  Additional Equipment:   Intra-op Plan:   Post-operative Plan:   Informed Consent: I have reviewed the patients History and Physical, chart, labs and discussed the procedure including the risks, benefits and alternatives for the proposed anesthesia with the patient or authorized representative who has indicated his/her understanding and acceptance.       Plan Discussed with: CRNA  Anesthesia Plan Comments:         Anesthesia Quick Evaluation

## 2020-12-25 NOTE — Op Note (Signed)
[facility] Gastroenterology Patient Name: Sheryl [name] Procedure Date: 12/25/2020 9:17 AM MRN: [id number] Account #: [id number] Date of Birth: 04/02/1951 Admit Type: Outpatient Age: 70 Room: [facility] ENDO ROOM 4 Gender: Female Note Status: Finalized Instrument Name: Nelda Marseille [id number] Procedure:             Colonoscopy Indications:           Chronic diarrhea Providers:             Midge Minium MD, MD Referring MD:          Lorenso Quarry (Referring MD) Medicines:             Propofol per Anesthesia Complications:         No immediate complications. Procedure:             Pre-Anesthesia Assessment:                        - Prior to the procedure, a History and Physical was                         performed, and patient medications and allergies were                         reviewed. The patient's tolerance of previous                         anesthesia was also reviewed. The risks and benefits                         of the procedure and the sedation options and risks                         were discussed with the patient. All questions were                         answered, and informed consent was obtained. Prior                         Anticoagulants: The patient has taken no previous                         anticoagulant or antiplatelet agents. ASA Grade                         Assessment: II - A patient with mild systemic disease.                         After reviewing the risks and benefits, the patient                         was deemed in satisfactory condition to undergo the                         procedure.                        After obtaining informed consent, the colonoscope was  passed under direct vision. Throughout the procedure,                         the patient's blood pressure, pulse, and oxygen                         saturations were monitored continuously. The                         Colonoscope was introduced through  the anus and                         advanced to the the cecum, identified by appendiceal                         orifice and ileocecal valve. The colonoscopy was                         performed without difficulty. The patient tolerated                         the procedure well. The quality of the bowel                         preparation was adequate to identify polyps. Findings:      The perianal and digital rectal examinations were normal.      Multiple small-mouthed diverticula were found in the sigmoid colon.      The exam was otherwise without abnormality.      Non-bleeding internal hemorrhoids were found during retroflexion. The       hemorrhoids were Grade I (internal hemorrhoids that do not prolapse).      Biopsies were taken with a cold forceps in the entire colon for       histology. Impression:            - Diverticulosis in the sigmoid colon.                        - The examination was otherwise normal.                        - Non-bleeding internal hemorrhoids.                        - Biopsies were taken with a cold forceps for                         histology in the entire colon. Recommendation:        - Discharge patient to home.                        - Resume previous diet.                        - Continue present medications.                        - Await pathology results.                        - Repeat  colonoscopy is not recommended due to current                         age (64 years or older) for screening purposes. Procedure Code(s):     --- Professional ---                        [phone], Colonoscopy, flexible; with biopsy, single or                         multiple Diagnosis Code(s):     --- Professional ---                        K52.9, Noninfective gastroenteritis and colitis,                         unspecified CPT copyright 2019 American Medical Association. All rights reserved. The codes documented in this report are preliminary and upon coder  review may  be revised to meet current compliance requirements. Midge Minium MD, MD 12/25/2020 9:47:45 AM This report has been signed electronically. Number of Addenda: 0 Note Initiated On: 12/25/2020 9:17 AM Scope Withdrawal Time: 0 hours 4 minutes 17 seconds  Total Procedure Duration: 0 hours 10 minutes 40 seconds  Estimated Blood Loss:  Estimated blood loss: none.      [facility]

## 2020-12-25 NOTE — H&P (Addendum)
Midge Minium, MD [facility] [address], [address] [place], [place] [address] Phone:[phone] Fax : [phone]  Primary Care Physician:  Lorenso Quarry, NP Primary Gastroenterologist:  Dr. Servando Snare  Pre-Procedure History & Physical: HPI:  Sheryl [name] is a 70 y.o. female is here for an endoscopy and colonoscopy.   Past Medical History:  Diagnosis Date   Anxiety    Arthritis    lower back, left hip    Chicken pox    Chronic kidney disease    Complication of anesthesia    Diabetes mellitus without complication (HCC)    GERD (gastroesophageal reflux disease)    History of hiatal hernia    History of kidney stones    Hypercholesterolemia    Hypertension    Migraines    rare now   Nephrolithiasis    PONV (postoperative nausea and vomiting)    VOMITED A LITTLE BIT AFTER KIDNEY STONE SURGERY    Past Surgical History:  Procedure Laterality Date   BREAST CYST ASPIRATION Left    negative 2012   CHOLECYSTECTOMY     COLONOSCOPY     COLONOSCOPY, ESOPHAGOGASTRODUODENOSCOPY (EGD) AND ESOPHAGEAL DILATION     CYSTOSCOPY W/ RETROGRADES Bilateral 12/04/2018   Procedure: CYSTOSCOPY WITH RETROGRADE PYELOGRAM;  Surgeon: Vanna Scotland, MD;  Location: [facility];  Service: Urology;  Laterality: Bilateral;   CYSTOSCOPY/URETEROSCOPY/HOLMIUM LASER/STENT PLACEMENT Left 06/26/2018   Procedure: CYSTOSCOPY/URETEROSCOPY/HOLMIUM LASER/STENT Exchange;  Surgeon: Vanna Scotland, MD;  Location: [facility];  Service: Urology;  Laterality: Left;   CYSTOSCOPY/URETEROSCOPY/HOLMIUM LASER/STENT PLACEMENT Right 12/04/2018   Procedure: CYSTOSCOPY/URETEROSCOPY/HOLMIUM LASER/STENT PLACEMENT;  Surgeon: Vanna Scotland, MD;  Location: [facility];  Service: Urology;  Laterality: Right;   DISTAL BICEPS TENDON REPAIR Right 01/19/2017   Procedure: BICEPS TENODESIS;  Surgeon: Christena Flake, MD;  Location: [facility];  Service: Orthopedics;  Laterality: Right;  Diabetic - oral meds   ESOPHAGOGASTRODUODENOSCOPY (EGD) WITH  PROPOFOL N/A 01/31/2015   Procedure: ESOPHAGOGASTRODUODENOSCOPY (EGD) WITH PROPOFOL;  Surgeon: Scot Jun, MD;  Location: [facility];  Service: Endoscopy;  Laterality: N/A;   ESOPHAGOGASTRODUODENOSCOPY (EGD) WITH PROPOFOL N/A 06/19/2020   Procedure: ESOPHAGOGASTRODUODENOSCOPY (EGD) WITH PROPOFOL;  Surgeon: Midge Minium, MD;  Location: [facility] END[facility]COPY;  Service: Endoscopy;  Laterality: N/A;   esophogastroduodenoscopy     esophogastroduodeoscopy     HERNIA REPAIR     IR NEPHROSTOMY PLACEMENT LEFT  05/22/2018   NEPHROLITHOTOMY Left 05/22/2018   Procedure: NEPHROLITHOTOMY PERCUTANEOUS;  Surgeon: Vanna Scotland, MD;  Location: [facility];  Service: Urology;  Laterality: Left;   SAVORY DILATION N/A 01/31/2015   Procedure: SAVORY DILATION;  Surgeon: Scot Jun, MD;  Location: [facility] ENDOSCOPY;  Service: Endoscopy;  Laterality: N/A;   SHOULDER ARTHROSCOPY Right 01/19/2017   Procedure: ARTHROSCOPY SHOULDER DEBRIDEMENT DECOMPRESSION AND  ROTATOR CUFF REPAIR AND BICEPS TENODESIS;  Surgeon: Christena Flake, MD;  Location: [facility];  Service: Orthopedics;  Laterality: Right;    Prior to Admission medications   Medication Sig Start Date End Date Taking? Authorizing Provider  Cholecalciferol (VITAMIN D3) 50 MCG (2000 UT) capsule Take 2,000 Units by mouth daily.   Yes [provider]  famotidine (PEPCID) 10 MG tablet Take 10 mg by mouth every other day.    Yes [provider]  lisinopril (PRINIVIL,ZESTRIL) 5 MG tablet Take 5 mg by mouth every morning.    Yes [provider]  metFORMIN (GLUCOPHAGE) 500 MG tablet Take 500 mg by mouth 2 (two) times daily with a meal.   Yes [provider]  potassium chloride (KLOR-CON) 8 MEQ tablet Take 1 tablet twice daily for the next 3 days, then decrease to 1 tablet daily. 01/01/20  Yes [provider]  rosuvastatin (CRESTOR) 5 MG tablet Take 5 mg by mouth at bedtime.    Yes [provider]  vitamin B-12  (CYANOCOBALAMIN) 1000 MCG tablet Take 1,000 mcg by mouth daily.   Yes [provider]  lisinopril-hydrochlorothiazide (ZESTORETIC) 20-25 MG tablet Take by mouth. 12/14/19 12/13/20  [provider]  polyethylene glycol-electrolytes (GAVILYTE-N WITH FLAVOR PACK) 420 g solution Drink one 8 oz glass every 20 mins until entire container is finished starting at 5:00pm on 12/24/20 Patient not taking: Reported on 12/25/2020 11/21/20   Midge Minium, MD  sertraline (ZOLOFT) 100 MG tablet Take by mouth. 12/14/19 12/13/20  [provider]  tamsulosin (FLOMAX) 0.4 MG CAPS capsule TAKE 1 CAPSULE BY MOUTH EVERY DAY 03/21/19   Vanna Scotland, MD    Allergies as of 11/21/2020   (No Known Allergies)    History reviewed. No pertinent family history.  Social History   Socioeconomic History   Marital status: Married    Spouse name: Not on file   Number of children: Not on file   Years of education: Not on file   Highest education level: Not on file  Occupational History   Not on file  Tobacco Use   Smoking status: Former    Packs/day: 1.50    Years: 25.00    Pack years: 37.50    Types: Cigarettes    Quit date: 2010    Years since quitting: 12.7   Smokeless tobacco: Never  Vaping Use   Vaping Use: Never used  Substance and Sexual Activity   Alcohol use: No   Drug use: No   Sexual activity: Yes    Birth control/protection: Post-menopausal  Other Topics Concern   Not on file  Social History Narrative   Not on file   Social Determinants of Health   Financial Resource Strain: Not on file  Food Insecurity: Not on file  Transportation Needs: Not on file  Physical Activity: Not on file  Stress: Not on file  Social Connections: Not on file  Intimate Partner Violence: Not on file    Review of Systems: See HPI, otherwise negative ROS  Physical Exam: BP 132/70   Pulse 89   Temp (!) 97.2 F (36.2 C) (Temporal)   Resp 18   Ht 5\' 3"  (1.6 m)   Wt 72.1 kg   SpO2 98%    BMI 28.17 kg/m  General:   Alert,  pleasant and cooperative in NAD Head:  Normocephalic and atraumatic. Neck:  Supple; no masses or thyromegaly. Lungs:  Clear throughout to auscultation.    Heart:  Regular rate and rhythm. Abdomen:  Soft, nontender and nondistended. Normal bowel sounds, without guarding, and without rebound.   Neurologic:  Alert and  oriented x4;  grossly normal neurologically.  Impression/Plan: Sheryl [name] is here for an endoscopy and colonoscopy  to be performed for dysphagia and diarrhea  Risks, benefits, limitations, and alternatives regarding  endoscopy and colonoscopy have been reviewed with the patient.  Questions have been answered.  All parties agreeable.   Isaias Cowman, MD  12/25/2020, 9:06 AM

## 2020-12-25 NOTE — Op Note (Signed)
[facility] Gastroenterology Patient Name: Sheryl [name] Procedure Date: 12/25/2020 9:18 AM MRN: [id number] Account #: [id number] Date of Birth: 19-Jan-1951 Admit Type: Outpatient Age: 70 Room: [facility] ENDO ROOM 4 Gender: Female Note Status: Finalized Instrument Name: Upper Endoscope [id number] Procedure:             Upper GI endoscopy Indications:           Dysphagia Providers:             Midge Minium MD, MD Referring MD:          Lorenso Quarry (Referring MD) Medicines:             Propofol per Anesthesia Complications:         No immediate complications. Procedure:             Pre-Anesthesia Assessment:                        - Prior to the procedure, a History and Physical was                         performed, and patient medications and allergies were                         reviewed. The patient's tolerance of previous                         anesthesia was also reviewed. The risks and benefits                         of the procedure and the sedation options and risks                         were discussed with the patient. All questions were                         answered, and informed consent was obtained. Prior                         Anticoagulants: The patient has taken no previous                         anticoagulant or antiplatelet agents. ASA Grade                         Assessment: II - A patient with mild systemic disease.                         After reviewing the risks and benefits, the patient                         was deemed in satisfactory condition to undergo the                         procedure.                        After obtaining informed consent, the endoscope was  passed under direct vision. Throughout the procedure,                         the patient's blood pressure, pulse, and oxygen                         saturations were monitored continuously. The                         Endosonoscope was introduced  through the mouth, and                         advanced to the second part of duodenum. The upper GI                         endoscopy was accomplished without difficulty. The                         patient tolerated the procedure well. Findings:      A small hiatal hernia was present.      One benign-appearing, intrinsic moderate stenosis was found at the       gastroesophageal junction. The stenosis was traversed. A TTS dilator was       passed through the scope. Dilation with a 12-13.5-15 mm balloon dilator       was performed to 15 mm. The dilation site was examined following       endoscope reinsertion and showed moderate improvement in luminal       narrowing.      Localized moderate inflammation characterized by erosions was found in       the gastric antrum. Biopsies were taken with a cold forceps for       histology.      Diffuse moderate inflammation characterized by erythema was found in the       entire duodenum. Biopsies were taken with a cold forceps for histology. Impression:            - Small hiatal hernia.                        - Benign-appearing esophageal stenosis. Dilated.                        - Gastritis. Biopsied.                        - Duodenitis. Biopsied. Recommendation:        - Discharge patient to home.                        - Resume previous diet.                        - Continue present medications.                        - Await pathology results.                        - Perform a colonoscopy today. Procedure Code(s):     --- Professional ---  [phone], Esophagogastroduodenoscopy, flexible,                         transoral; with transendoscopic balloon dilation of                         esophagus (less than 30 mm diameter)                        [phone], 59, Esophagogastroduodenoscopy, flexible,                         transoral; with biopsy, single or multiple Diagnosis Code(s):     --- Professional ---                         R13.10, Dysphagia, unspecified                        K29.70, Gastritis, unspecified, without bleeding                        K29.80, Duodenitis without bleeding                        K22.2, Esophageal obstruction CPT copyright 2019 American Medical Association. All rights reserved. The codes documented in this report are preliminary and upon coder review may  be revised to meet current compliance requirements. Midge Minium MD, MD 12/25/2020 9:31:51 AM This report has been signed electronically. Number of Addenda: 0 Note Initiated On: 12/25/2020 9:18 AM Estimated Blood Loss:  Estimated blood loss: none.      [facility]

## 2020-12-25 NOTE — Transfer of Care (Signed)
Immediate Anesthesia Transfer of Care Note  Patient: BRYTNI DRAY  Procedure(s) Performed: COLONOSCOPY WITH PROPOFOL ESOPHAGOGASTRODUODENOSCOPY (EGD) WITH PROPOFOL  Patient Location: PACU  Anesthesia Type:MAC  Level of Consciousness: drowsy  Airway & Oxygen Therapy: Patient Spontanous Breathing and Patient connected to nasal cannula oxygen  Post-op Assessment: Report given to RN and Post -op Vital signs reviewed and stable  Post vital signs: stable  Last Vitals:  Vitals Value Taken Time  BP    Temp    Pulse 85 12/25/20 0950  Resp 16 12/25/20 0950  SpO2 96 % 12/25/20 0950  Vitals shown include unvalidated device data.  Last Pain:  Vitals:   12/25/20 0856  TempSrc: Temporal  PainSc: 0-No pain         Complications: No notable events documented.

## 2020-12-25 NOTE — Anesthesia Postprocedure Evaluation (Signed)
Anesthesia Post Note  Patient: FRANKEE GRITZ  Procedure(s) Performed: COLONOSCOPY WITH PROPOFOL ESOPHAGOGASTRODUODENOSCOPY (EGD) WITH PROPOFOL  Patient location during evaluation: PACU Anesthesia Type: General Level of consciousness: awake and alert Pain management: pain level controlled Vital Signs Assessment: post-procedure vital signs reviewed and stable Respiratory status: spontaneous breathing, nonlabored ventilation, respiratory function stable and patient connected to nasal cannula oxygen Cardiovascular status: blood pressure returned to baseline and stable Postop Assessment: no apparent nausea or vomiting Anesthetic complications: no   No notable events documented.   Last Vitals:  Vitals:   12/25/20 1010 12/25/20 1020  BP: 115/67 126/70  Pulse: 85 82  Resp: 19 15  Temp:    SpO2: 96% 97%    Last Pain:  Vitals:   12/25/20 0856  TempSrc: Temporal  PainSc: 0-No pain                 Adrien Shankar Areatha Keas

## 2020-12-26 ENCOUNTER — Encounter: Payer: Self-pay | Admitting: Gastroenterology

## 2020-12-26 LAB — SURGICAL PATHOLOGY

## 2021-02-03 ENCOUNTER — Other Ambulatory Visit: Payer: Self-pay | Admitting: Gerontology

## 2021-02-03 DIAGNOSIS — Z1231 Encounter for screening mammogram for malignant neoplasm of breast: Secondary | ICD-10-CM

## 2021-03-03 ENCOUNTER — Telehealth: Payer: Self-pay | Admitting: Gastroenterology

## 2021-03-03 NOTE — Telephone Encounter (Signed)
Inbound call from pt requesting a call back stating that she having poor appetite and is losing weight. Please advise. Thank you.

## 2021-03-03 NOTE — Telephone Encounter (Signed)
Returned pt's call. Pt has been scheduled for an appt with Dr. Servando Snare, tomorrow, 03/03/21.

## 2021-03-04 ENCOUNTER — Other Ambulatory Visit: Payer: Self-pay

## 2021-03-04 ENCOUNTER — Ambulatory Visit (INDEPENDENT_AMBULATORY_CARE_PROVIDER_SITE_OTHER): Payer: Medicare PPO | Admitting: Gastroenterology

## 2021-03-04 VITALS — BP 120/66 | HR 94 | Temp 97.8°F | Ht 63.0 in | Wt 144.0 lb

## 2021-03-04 DIAGNOSIS — R110 Nausea: Secondary | ICD-10-CM | POA: Diagnosis not present

## 2021-03-04 DIAGNOSIS — R634 Abnormal weight loss: Secondary | ICD-10-CM | POA: Diagnosis not present

## 2021-03-04 NOTE — Progress Notes (Signed)
Primary Care Physician: Toni Arthurs, NP  Primary Gastroenterologist:  Dr. Lucilla Lame  Chief Complaint  Patient presents with   Weight Loss    Nausea and vomiting     HPI: Sheryl [name] is a 70 y.o. female here with report of weight loss and had been seen for me in the past with diarrhea and dysphagia.  The patient had an EGD with dilation of the esophagus due to stricture.  The balloon was inflated up to 15 mm to see how the patient will respond.  The patient also had evidence of gastritis with healing ulcers.  The patient's weight was last recorded at 159 and she had previously been 180 pounds.  Her colonoscopy showed some melanosis coli but random biopsies did not show any cause for her diarrhea.  The patient was having diarrhea since having her gallbladder out. The patient was 144lbs today.  She reports that she has been having problems with meat and states that she gained the spaghetti but the meatballs are a problem she also reports that when she eats sausage she has problems with nausea it and that.  The patient has no further dysphagia and reports that everything is going down but she just starts to feel nauseous after short time.  She has been under a lot of stress with her husband and having multiple medical problems including kidney disease and bladder cancer in addition to her father going for bypass surgery and ultimately dying.  She reports that she has not woken up from a sleep from the diarrhea and she also reports she only sleeps about 5 hours a night.  The patient also reports that she is able to keep down milk shakes and dairy products.  Past Medical History:  Diagnosis Date   Anesthesia complication    bradycardia   Anxiety    Arthritis    lower back, left hip    Chicken pox    Chronic kidney disease    Diabetes mellitus without complication (HCC)    GERD (gastroesophageal reflux disease)    History of hiatal hernia    History of kidney stones     Hypercholesterolemia    Hypertension    Migraines    rare now   Nephrolithiasis    PONV (postoperative nausea and vomiting)    VOMITED A LITTLE BIT AFTER KIDNEY STONE SURGERY    Current Outpatient Medications  Medication Sig Dispense Refill   Cholecalciferol (VITAMIN D3) 50 MCG (2000 UT) capsule Take 2,000 Units by mouth daily.     famotidine (PEPCID) 10 MG tablet Take 10 mg by mouth every other day.      lisinopril (PRINIVIL,ZESTRIL) 5 MG tablet Take 5 mg by mouth every morning.      lisinopril-hydrochlorothiazide (ZESTORETIC) 20-25 MG tablet Take by mouth.     metFORMIN (GLUCOPHAGE) 500 MG tablet Take 500 mg by mouth 2 (two) times daily with a meal.     polyethylene glycol-electrolytes (GAVILYTE-N WITH FLAVOR PACK) 420 g solution Drink one 8 oz glass every 20 mins until entire container is finished starting at 5:00pm on 12/24/20 (Patient not taking: Reported on 12/25/2020) 4000 mL 0   potassium chloride (KLOR-CON) 8 MEQ tablet Take 1 tablet twice daily for the next 3 days, then decrease to 1 tablet daily.     rosuvastatin (CRESTOR) 5 MG tablet Take 5 mg by mouth at bedtime.      sertraline (ZOLOFT) 100 MG tablet Take by mouth.     tamsulosin (  FLOMAX) 0.4 MG CAPS capsule TAKE 1 CAPSULE BY MOUTH EVERY DAY 30 capsule 0   vitamin B-12 (CYANOCOBALAMIN) 1000 MCG tablet Take 1,000 mcg by mouth daily.     No current facility-administered medications for this visit.    Allergies as of 03/04/2021   (No Known Allergies)    ROS:  General: Negative for anorexia, weight loss, fever, chills, fatigue, weakness. ENT: Negative for hoarseness, difficulty swallowing , nasal congestion. CV: Negative for chest pain, angina, palpitations, dyspnea on exertion, peripheral edema.  Respiratory: Negative for dyspnea at rest, dyspnea on exertion, cough, sputum, wheezing.  GI: See history of present illness. GU:  Negative for dysuria, hematuria, urinary incontinence, urinary frequency, nocturnal urination.   Endo: Negative for unusual weight change.    Physical Examination:   BP 120/66 (BP Location: Left Arm, Patient Position: Sitting, Cuff Size: Normal)   Pulse 94   Temp 97.8 F (36.6 C) (Temporal)   Ht 5\' 3"  (1.6 m)   Wt 144 lb (65.3 kg)   BMI 25.51 kg/m   General: Well-nourished, well-developed in no acute distress.  Eyes: No icterus. Conjunctivae pink. Lungs: Clear to auscultation bilaterally. Non-labored. Heart: Regular rate and rhythm, no murmurs rubs or gallops.  Abdomen: Bowel sounds are normal, nontender, nondistended, no hepatosplenomegaly or masses, no abdominal bruits or hernia , no rebound or guarding.   Extremities: No lower extremity edema. No clubbing or deformities. Neuro: Alert and oriented x 3.  Grossly intact. Skin: Warm and dry, no jaundice.   Psych: Alert and cooperative, normal mood and affect.  Labs:    Imaging Studies: No results found.  Assessment and Plan:   Sheryl [name] is a 70 y.o. y/o female who comes in today with weight loss diarrhea and nausea.  The patient has had an EGD and colonoscopy with gastritis and healing ulcer seen on the upper endoscopy and random biopsies of the colon being negative.  The patient may have irritable bowel syndrome although the unexplained weight loss is concerning.  She also reports problems with meat products.  The patient will have her blood checked for her gastrin levels and will also have her blood checked for alpha gal.  She will avoid milk products to see if this helps with her diarrhea and she will also be set up for CT scan of the head due to the nausea unexplained weight loss and negative GI work-up.  The patient has been explained the plan agrees with it.     73, MD. Midge Minium    Note: This dictation was prepared with Dragon dictation along with smaller phrase technology. Any transcriptional errors that result from this process are unintentional.

## 2021-03-06 ENCOUNTER — Telehealth: Payer: Self-pay

## 2021-03-06 LAB — ALPHA-GAL PANEL
Allergen Lamb IgE: <0.1 kU/L
Beef IgE: <0.1 kU/L
IgE (Immunoglobulin E), Serum: 201 IU/mL (ref 6–495)
O215-IgE Alpha-Gal: <0.1 kU/L
Pork IgE: <0.1 kU/L

## 2021-03-06 LAB — GASTRIN: Gastrin: 29 pg/mL (ref 0–115)

## 2021-03-06 NOTE — Telephone Encounter (Signed)
Pt notified of lab results through mychart.  

## 2021-03-06 NOTE — Telephone Encounter (Signed)
-----   Message from Midge Minium, MD sent at 03/06/2021 11:46 AM EST ----- Let the patient know that the blood tests were normal.

## 2021-03-18 ENCOUNTER — Ambulatory Visit
Admission: RE | Admit: 2021-03-18 | Discharge: 2021-03-18 | Disposition: A | Discharge status: Home / Self Care | Payer: Medicare PPO | Source: Ambulatory Visit | Attending: Gastroenterology | Admitting: Gastroenterology

## 2021-03-18 DIAGNOSIS — I679 Cerebrovascular disease, unspecified: Secondary | ICD-10-CM | POA: Insufficient documentation

## 2021-03-18 DIAGNOSIS — R110 Nausea: Secondary | ICD-10-CM | POA: Diagnosis present

## 2021-03-18 DIAGNOSIS — R634 Abnormal weight loss: Secondary | ICD-10-CM | POA: Insufficient documentation

## 2021-03-18 LAB — POCT I-STAT CREATININE: Creatinine, Ser: 1 mg/dL (ref 0.44–1.00)

## 2021-03-18 MED ORDER — IOHEXOL 300 MG/ML  SOLN
75.0000 mL | Freq: Once | INTRAMUSCULAR | Status: AC | PRN
  Administered 2021-03-18: 15:00:00 75 mL via INTRAVENOUS

## 2021-07-02 ENCOUNTER — Ambulatory Visit
Admission: RE | Admit: 2021-07-02 | Discharge: 2021-07-02 | Disposition: A | Discharge status: Home / Self Care | Payer: Medicare PPO | Source: Ambulatory Visit | Attending: Gerontology | Admitting: Gerontology

## 2021-07-02 DIAGNOSIS — Z1231 Encounter for screening mammogram for malignant neoplasm of breast: Secondary | ICD-10-CM | POA: Insufficient documentation

## 2021-11-29 ENCOUNTER — Emergency Department: Payer: Medicare PPO

## 2021-11-29 ENCOUNTER — Other Ambulatory Visit: Payer: Self-pay

## 2021-11-29 ENCOUNTER — Encounter: Payer: Self-pay | Admitting: Emergency Medicine

## 2021-11-29 ENCOUNTER — Emergency Department
Admission: EM | Admit: 2021-11-29 | Discharge: 2021-11-29 | Disposition: A | Discharge status: Home / Self Care | Payer: Medicare PPO | Attending: Emergency Medicine | Admitting: Emergency Medicine

## 2021-11-29 DIAGNOSIS — S8992XA Unspecified injury of left lower leg, initial encounter: Secondary | ICD-10-CM

## 2021-11-29 DIAGNOSIS — W010XXA Fall on same level from slipping, tripping and stumbling without subsequent striking against object, initial encounter: Secondary | ICD-10-CM | POA: Diagnosis not present

## 2021-11-29 DIAGNOSIS — M25551 Pain in right hip: Secondary | ICD-10-CM | POA: Insufficient documentation

## 2021-11-29 DIAGNOSIS — W19XXXA Unspecified fall, initial encounter: Secondary | ICD-10-CM

## 2021-11-29 DIAGNOSIS — S80212A Abrasion, left knee, initial encounter: Secondary | ICD-10-CM | POA: Diagnosis not present

## 2021-11-29 MED ORDER — CEPHALEXIN 500 MG PO CAPS
500.0000 mg | ORAL_CAPSULE | Freq: Once | ORAL | Status: AC
Start: 2021-11-29 — End: 2021-11-29
  Administered 2021-11-29: 500 mg via ORAL
  Filled 2021-11-29: qty 1

## 2021-11-29 MED ORDER — CEPHALEXIN 500 MG PO CAPS: 500.0000 mg | ORAL_CAPSULE | Freq: Four times a day (QID) | ORAL | 0 refills | Status: AC

## 2021-11-29 NOTE — ED Triage Notes (Signed)
Pt reports slipped and fell on Thursday hurting her right hip and left knee. Pt reports has an abrasion to her left knee cap and it keeps draining stuff. Pt reports able to walk but painful to do so. Pt refusing wheelchair at this time

## 2021-11-29 NOTE — ED Provider Notes (Signed)
[facility] Provider Note    Event Date/Time   First MD Initiated Contact with Patient 11/29/21 1144     (approximate)   History   Knee Pain   HPI  Sheryl [name] is a 71 y.o. female   Past medical history of noncontributory to today's presentation of mechanical slip and fall a couple days ago when she got tangled up in her dog's leash and fell forward onto her knees.  No head strike or loss of consciousness.  No thinners.  She was able to get up and ambulate and felt well enough to not see a doctor.  However she developed an abrasion to the left knee that has some surrounding redness that she is concerned is infected.  She is not immunocompromised.  She has had no systemic symptoms including fever, chills, nausea or vomiting.  No other complaints at this time.  History was obtained via the patient and a family member who is at bedside.      Physical Exam   Triage Vital Signs: ED Triage Vitals  Enc Vitals Group     BP 11/29/21 1024 121/67     Pulse Rate 11/29/21 1024 90     Resp 11/29/21 1024 20     Temp 11/29/21 1024 98.4 F (36.9 C)     Temp Source 11/29/21 1024 Oral     SpO2 11/29/21 1024 96 %     Weight 11/29/21 1022 160 lb (72.6 kg)     Height 11/29/21 1022 5\' 3"  (1.6 m)     Head Circumference --      Peak Flow --      Pain Score 11/29/21 1022 5     Pain Loc --      Pain Edu? --      Excl. in GC? --     Most recent vital signs: Vitals:   11/29/21 1024 11/29/21 1318  BP: 121/67 (!) 114/59  Pulse: 90 80  Resp: 20 19  Temp: 98.4 F (36.9 C) 97.9 F (36.6 C)  SpO2: 96% 96%    General: Awake, no distress.  Toxic appearing.  Pleasant. CV:  Good peripheral perfusion.  Resp:  Normal effort.  Abd:  No distention.  Small abrasion with some minimal surrounding redness and no purulence to left anterior knee.  No bony tenderness and full range of motion.  Full range of motion of the right hip.   ED Results / Procedures / Treatments    Labs (all labs ordered are listed, but only abnormal results are displayed) Labs Reviewed - No data to display   RADIOLOGY I dependently reviewed and interpreted x-ray of the left knee and see no obvious fracture or dislocation.   PROCEDURES:  Critical Care performed: No  Procedures   MEDICATIONS ORDERED IN ED: Medications  cephALEXin (KEFLEX) capsule 500 mg (500 mg Oral Given 11/29/21 1236)    IMPRESSION / MDM / ASSESSMENT AND PLAN / ED COURSE  I reviewed the triage vital signs and the nursing notes.                              Differential diagnosis includes, but is not limited to, cellulitis, fracture dislocation of the affected knee and right hip    MDM: Likely fracture dislocation given ambulatory and weightbearing at the affected right hip and left knee.  X-rays negative for acute fracture or dislocation.  Patient continues to be ambulatory.  She has some cellulitic changes surrounding the abrasion to the left knee that are very mild, no crepitus or significant pain, very low suspicion for emergent infectious etiology like septic joint or necrotizing fasciitis.  Will prescribe antibiotics as outpatient have patient follow-up with her PMD and return with any progression or worsening..   Patient's presentation is most consistent with acute illness / injury with system symptoms.       FINAL CLINICAL IMPRESSION(S) / ED DIAGNOSES   Final diagnoses:  Left knee injury, initial encounter  Right hip pain  Fall, initial encounter     Rx / DC Orders   ED Discharge Orders          Ordered    cephALEXin (KEFLEX) 500 MG capsule  4 times daily        11/29/21 1221             Note:  This document was prepared using Dragon voice recognition software and may include unintentional dictation errors.    Pilar Jarvis, MD 11/29/21 [phone]

## 2021-11-29 NOTE — ED Notes (Signed)
Pt discharged prior to RN assessment. Pt verbalized understanding of discharge instructions, prescriptions, and follow-up care instructions. Pt advised if symptoms worsen to return to ED. 

## 2021-11-29 NOTE — Discharge Instructions (Signed)
Take Tylenol 650 mg and ibuprofen 400 mg with food every 6 hours for pain. Take antibiotics for the full course as prescribed. See your doctor this week for checkup.  If you have any new or worsening symptoms, come back to the emergency department.  Particularly, pay attention to the wound and if you see spreading redness, or develop fever or chills, come back to the emergency department.

## 2021-12-15 ENCOUNTER — Ambulatory Visit: Payer: Medicare PPO | Admitting: Gastroenterology

## 2021-12-15 ENCOUNTER — Encounter: Payer: Self-pay | Admitting: Gastroenterology

## 2021-12-15 ENCOUNTER — Other Ambulatory Visit: Payer: Self-pay

## 2021-12-15 DIAGNOSIS — R1319 Other dysphagia: Secondary | ICD-10-CM

## 2021-12-15 NOTE — Progress Notes (Deleted)
Primary Care Physician: Luciana Axe, NP  Primary Gastroenterologist:  Dr. Midge Minium  No chief complaint on file.   HPI: BRAZIL VOYTKO is a 71 y.o. female here with a history of dysphagia.  The patient has been dilated with a esophageal balloon at her previous upper endoscopy due to an esophageal stricture.  Past Medical History:  Diagnosis Date   Anesthesia complication    bradycardia   Anxiety    Arthritis    lower back, left hip    Chicken pox    Chronic kidney disease    Diabetes mellitus without complication (HCC)    GERD (gastroesophageal reflux disease)    History of hiatal hernia    History of kidney stones    Hypercholesterolemia    Hypertension    Migraines    rare now   Nephrolithiasis    PONV (postoperative nausea and vomiting)    VOMITED A LITTLE BIT AFTER KIDNEY STONE SURGERY    Current Outpatient Medications  Medication Sig Dispense Refill   Cholecalciferol (VITAMIN D3) 50 MCG (2000 UT) capsule Take 2,000 Units by mouth daily.     famotidine (PEPCID) 10 MG tablet Take 10 mg by mouth every other day.      lisinopril (PRINIVIL,ZESTRIL) 5 MG tablet Take 5 mg by mouth every morning.      lisinopril-hydrochlorothiazide (ZESTORETIC) 20-25 MG tablet Take by mouth.     metFORMIN (GLUCOPHAGE) 500 MG tablet Take 500 mg by mouth 2 (two) times daily with a meal.     potassium chloride (KLOR-CON) 8 MEQ tablet Take 1 tablet twice daily for the next 3 days, then decrease to 1 tablet daily.     rosuvastatin (CRESTOR) 5 MG tablet Take 5 mg by mouth at bedtime.      sertraline (ZOLOFT) 100 MG tablet Take by mouth.     tamsulosin (FLOMAX) 0.4 MG CAPS capsule TAKE 1 CAPSULE BY MOUTH EVERY DAY 30 capsule 0   vitamin B-12 (CYANOCOBALAMIN) 1000 MCG tablet Take 1,000 mcg by mouth daily.     No current facility-administered medications for this visit.    Allergies as of 12/15/2021   (No Known Allergies)    ROS:  General: Negative for anorexia, weight loss,  fever, chills, fatigue, weakness. ENT: Negative for hoarseness, difficulty swallowing , nasal congestion. CV: Negative for chest pain, angina, palpitations, dyspnea on exertion, peripheral edema.  Respiratory: Negative for dyspnea at rest, dyspnea on exertion, cough, sputum, wheezing.  GI: See history of present illness. GU:  Negative for dysuria, hematuria, urinary incontinence, urinary frequency, nocturnal urination.  Endo: Negative for unusual weight change.    Physical Examination:   There were no vitals taken for this visit.  General: Well-nourished, well-developed in no acute distress.  Eyes: No icterus. Conjunctivae pink. Lungs: Clear to auscultation bilaterally. Non-labored. Heart: Regular rate and rhythm, no murmurs rubs or gallops.  Abdomen: Bowel sounds are normal, nontender, nondistended, no hepatosplenomegaly or masses, no abdominal bruits or hernia , no rebound or guarding.   Extremities: No lower extremity edema. No clubbing or deformities. Neuro: Alert and oriented x 3.  Grossly intact. Skin: Warm and dry, no jaundice.   Psych: Alert and cooperative, normal mood and affect.  Labs:    Imaging Studies: DG HIP UNILAT WITH PELVIS 2-3 VIEWS RIGHT  Result Date: 11/29/2021 CLINICAL DATA:  Trauma, fall EXAM: DG HIP (WITH OR WITHOUT PELVIS) 2-3V RIGHT COMPARISON:  None Available. FINDINGS: No fracture or dislocation is seen. Small bony spurs  are seen in right hip. Small sclerotic density noted overlying the right iliac bone may suggest benign bone island. Similar finding was seen in previous CT done on 12/10/2018. IMPRESSION: No fracture or dislocation is seen in pelvis and right hip. Electronically Signed   By: Ernie Avena M.D.   On: 11/29/2021 13:06   DG Knee Complete 4 Views Left  Result Date: 11/29/2021 CLINICAL DATA:  Pain.  Recent fall. EXAM: LEFT KNEE - COMPLETE 4+ VIEW COMPARISON:  None Available. FINDINGS: Mild anterior soft tissue swelling. Enthesopathic  changes off the superior patella. No fracture, dislocation, significant degenerative change, or effusion. IMPRESSION: Mild anterior soft tissue swelling consistent with history. No joint effusion or bony abnormality. Electronically Signed   By: Gerome Sam III M.D.   On: 11/29/2021 13:05    Assessment and Plan:   CHARLY HUNTON is a 70 y.o. y/o female ***     Midge Minium, MD. Clementeen Graham    Note: This dictation was prepared with Dragon dictation along with smaller phrase technology. Any transcriptional errors that result from this process are unintentional.

## 2021-12-22 ENCOUNTER — Ambulatory Visit: Payer: Medicare PPO | Admitting: Anesthesiology

## 2021-12-22 ENCOUNTER — Encounter
Admission: RE | Disposition: A | Discharge status: Home / Self Care | Payer: Self-pay | Source: Home / Self Care | Attending: Gastroenterology

## 2021-12-22 ENCOUNTER — Ambulatory Visit
Admission: RE | Admit: 2021-12-22 | Discharge: 2021-12-22 | Disposition: A | Discharge status: Home / Self Care | Payer: Medicare PPO | Attending: Gastroenterology | Admitting: Gastroenterology

## 2021-12-22 ENCOUNTER — Encounter: Payer: Self-pay | Admitting: Gastroenterology

## 2021-12-22 ENCOUNTER — Other Ambulatory Visit: Payer: Self-pay

## 2021-12-22 DIAGNOSIS — K222 Esophageal obstruction: Secondary | ICD-10-CM | POA: Diagnosis not present

## 2021-12-22 DIAGNOSIS — R519 Headache, unspecified: Secondary | ICD-10-CM | POA: Diagnosis not present

## 2021-12-22 DIAGNOSIS — E7800 Pure hypercholesterolemia, unspecified: Secondary | ICD-10-CM | POA: Insufficient documentation

## 2021-12-22 DIAGNOSIS — R1319 Other dysphagia: Secondary | ICD-10-CM | POA: Diagnosis not present

## 2021-12-22 DIAGNOSIS — N189 Chronic kidney disease, unspecified: Secondary | ICD-10-CM | POA: Diagnosis not present

## 2021-12-22 DIAGNOSIS — I129 Hypertensive chronic kidney disease with stage 1 through stage 4 chronic kidney disease, or unspecified chronic kidney disease: Secondary | ICD-10-CM | POA: Insufficient documentation

## 2021-12-22 DIAGNOSIS — E1122 Type 2 diabetes mellitus with diabetic chronic kidney disease: Secondary | ICD-10-CM | POA: Diagnosis not present

## 2021-12-22 DIAGNOSIS — K449 Diaphragmatic hernia without obstruction or gangrene: Secondary | ICD-10-CM | POA: Insufficient documentation

## 2021-12-22 DIAGNOSIS — F419 Anxiety disorder, unspecified: Secondary | ICD-10-CM | POA: Diagnosis not present

## 2021-12-22 DIAGNOSIS — Z87891 Personal history of nicotine dependence: Secondary | ICD-10-CM | POA: Diagnosis not present

## 2021-12-22 DIAGNOSIS — R1310 Dysphagia, unspecified: Secondary | ICD-10-CM | POA: Insufficient documentation

## 2021-12-22 DIAGNOSIS — K219 Gastro-esophageal reflux disease without esophagitis: Secondary | ICD-10-CM | POA: Insufficient documentation

## 2021-12-22 HISTORY — PX: ESOPHAGOGASTRODUODENOSCOPY (EGD) WITH PROPOFOL: SHX5813

## 2021-12-22 LAB — GLUCOSE, CAPILLARY: Glucose-Capillary: 120 mg/dL — ABNORMAL HIGH (ref 70–99)

## 2021-12-22 SURGERY — ESOPHAGOGASTRODUODENOSCOPY (EGD) WITH PROPOFOL
Anesthesia: General

## 2021-12-22 MED ORDER — LIDOCAINE HCL (PF) 2 % IJ SOLN
INTRAMUSCULAR | Status: AC
  Filled 2021-12-22: qty 20

## 2021-12-22 MED ORDER — PROPOFOL 10 MG/ML IV BOLUS
INTRAVENOUS | Status: DC | PRN
  Administered 2021-12-22: 80 mg via INTRAVENOUS

## 2021-12-22 MED ORDER — PROPOFOL 500 MG/50ML IV EMUL
INTRAVENOUS | Status: DC | PRN
  Administered 2021-12-22: 175 ug/kg/min via INTRAVENOUS

## 2021-12-22 MED ORDER — SODIUM CHLORIDE 0.9 % IV SOLN: INTRAVENOUS | Status: DC

## 2021-12-22 MED ORDER — PROPOFOL 1000 MG/100ML IV EMUL
INTRAVENOUS | Status: AC
  Filled 2021-12-22: qty 200

## 2021-12-22 MED ORDER — LIDOCAINE HCL (CARDIAC) PF 100 MG/5ML IV SOSY
PREFILLED_SYRINGE | INTRAVENOUS | Status: DC | PRN
  Administered 2021-12-22: 100 mg via INTRAVENOUS

## 2021-12-22 NOTE — Anesthesia Preprocedure Evaluation (Addendum)
Anesthesia Evaluation  Patient identified by MRN, date of birth, ID band Patient awake    Reviewed: Allergy & Precautions, NPO status , Patient's Chart, lab work & pertinent test results  History of Anesthesia Complications (+) PONV and history of anesthetic complications (bradycardia)  Airway Mallampati: III  TM Distance: >3 FB Neck ROM: Full    Dental  (+) Poor Dentition, Missing, Dental Advisory Given   Pulmonary neg pulmonary ROS, former smoker,    Pulmonary exam normal        Cardiovascular hypertension, Pt. on medications Normal cardiovascular exam     Neuro/Psych  Headaches, Anxiety negative psych ROS   GI/Hepatic Neg liver ROS, hiatal hernia, GERD  Medicated and Controlled,Esophageal stricture - difficulty swallowing food and pills    Endo/Other  negative endocrine ROSdiabetes, Type 2  Renal/GU Kidney stones  negative genitourinary   Musculoskeletal  (+) Arthritis , Osteoarthritis,    Abdominal   Peds negative pediatric ROS (+)  Hematology negative hematology ROS (+)   Anesthesia Other Findings 11/2018 MAC 3 grade 1 view  Reproductive/Obstetrics negative OB ROS                            Anesthesia Physical  Anesthesia Plan  ASA: 3  Anesthesia Plan: General   Post-op Pain Management: Minimal or no pain anticipated   Induction: Intravenous  PONV Risk Score and Plan: 3 and Propofol infusion and TIVA  Airway Management Planned: Natural Airway  Additional Equipment:   Intra-op Plan:   Post-operative Plan:   Informed Consent: I have reviewed the patients History and Physical, chart, labs and discussed the procedure including the risks, benefits and alternatives for the proposed anesthesia with the patient or authorized representative who has indicated his/her understanding and acceptance.     Dental Advisory Given  Plan Discussed with: CRNA, Anesthesiologist and  Surgeon  Anesthesia Plan Comments: (Patient consented for risks of anesthesia including but not limited to:  - adverse reactions to medications - risk of airway placement if required - damage to eyes, teeth, lips or other oral mucosa - nerve damage due to positioning  - sore throat or hoarseness - Damage to heart, brain, nerves, lungs, other parts of body or loss of life  Patient voiced understanding.)       Anesthesia Quick Evaluation

## 2021-12-22 NOTE — Transfer of Care (Signed)
Immediate Anesthesia Transfer of Care Note  Patient: Sheryl [name]  Procedure(s) Performed: ESOPHAGOGASTRODUODENOSCOPY (EGD) WITH PROPOFOL  Patient Location: Endoscopy Unit  Anesthesia Type:General  Level of Consciousness: drowsy  Airway & Oxygen Therapy: Patient Spontanous Breathing  Post-op Assessment: Report given to RN and Post -op Vital signs reviewed and stable  Post vital signs: Reviewed and stable  Last Vitals:  Vitals Value Taken Time  BP 126/64 12/22/21 0744  Temp    Pulse 79 12/22/21 0744  Resp 16 12/22/21 0744  SpO2 98 % 12/22/21 0744  Vitals shown include unvalidated device data.  Last Pain:  Vitals:   12/22/21 0704  TempSrc: Temporal  PainSc: 0-No pain         Complications: No notable events documented.

## 2021-12-22 NOTE — Op Note (Signed)
[facility] Gastroenterology Patient Name: Sheryl [name] Procedure Date: 12/22/2021 7:07 AM MRN: [id number] Account #: [id number] Date of Birth: 05-Jul-1950 Admit Type: Inpatient Age: 71 Room: [facility] ENDO ROOM 4 Gender: Female Note Status: Finalized Instrument Name: Upper Endoscope [id number] Procedure:             Upper GI endoscopy Indications:           Dysphagia Providers:             Lucilla Lame MD, MD Referring MD:          Lucilla Lame MD, MD (Referring MD) Medicines:             Propofol per Anesthesia Complications:         No immediate complications. Procedure:             Pre-Anesthesia Assessment:                        - Prior to the procedure, a History and Physical was                         performed, and patient medications and allergies were                         reviewed. The patient's tolerance of previous                         anesthesia was also reviewed. The risks and benefits                         of the procedure and the sedation options and risks                         were discussed with the patient. All questions were                         answered, and informed consent was obtained. Prior                         Anticoagulants: The patient has taken no previous                         anticoagulant or antiplatelet agents. ASA Grade                         Assessment: II - A patient with mild systemic disease.                         After reviewing the risks and benefits, the patient                         was deemed in satisfactory condition to undergo the                         procedure.                        After obtaining informed consent, the endoscope was  passed under direct vision. Throughout the procedure,                         the patient's blood pressure, pulse, and oxygen                         saturations were monitored continuously. The Endoscope                         was introduced  through the mouth, and advanced to the                         second part of duodenum. The upper GI endoscopy was                         accomplished without difficulty. The patient tolerated                         the procedure well. Findings:      A medium-sized hiatal hernia was present.      One benign-appearing, intrinsic moderate stenosis was found at the       gastroesophageal junction. The stenosis was traversed. A TTS dilator was       passed through the scope. Dilation with a 12-13.5-15 mm balloon dilator       was performed to 15 mm. The dilation site was examined following       endoscope reinsertion and showed moderate improvement in luminal       narrowing.      The stomach was normal.      The examined duodenum was normal. Impression:            - Medium-sized hiatal hernia.                        - Benign-appearing esophageal stenosis. Dilated.                        - Normal stomach.                        - Normal examined duodenum.                        - No specimens collected. Recommendation:        - Discharge patient to home.                        - Resume previous diet.                        - Continue present medications.                        - Repeat upper endoscopy in 4 weeks for retreatment. Procedure Code(s):     --- Professional ---                        [phone], Esophagogastroduodenoscopy, flexible,                         transoral; with transendoscopic balloon dilation of  esophagus (less than 30 mm diameter) Diagnosis Code(s):     --- Professional ---                        R13.10, Dysphagia, unspecified                        K22.2, Esophageal obstruction CPT copyright 2019 [employer]. All rights reserved. The codes documented in this report are preliminary and upon coder review may  be revised to meet current compliance requirements. Lucilla Lame MD, MD 12/22/2021 7:42:16 AM This report has been  signed electronically. Number of Addenda: 0 Note Initiated On: 12/22/2021 7:07 AM Estimated Blood Loss:  Estimated blood loss: none.      [facility]

## 2021-12-22 NOTE — Anesthesia Postprocedure Evaluation (Signed)
Anesthesia Post Note  Patient: Sheryl [name]  Procedure(s) Performed: ESOPHAGOGASTRODUODENOSCOPY (EGD) WITH PROPOFOL  Patient location during evaluation: Endoscopy Anesthesia Type: General Level of consciousness: awake and alert Pain management: pain level controlled Vital Signs Assessment: post-procedure vital signs reviewed and stable Respiratory status: spontaneous breathing, nonlabored ventilation, respiratory function stable and patient connected to nasal cannula oxygen Cardiovascular status: blood pressure returned to baseline and stable Postop Assessment: no apparent nausea or vomiting Anesthetic complications: no   No notable events documented.   Last Vitals:  Vitals:   12/22/21 0754 12/22/21 0804  BP: 129/68 133/66  Pulse: 74 70  Resp: 17 17  Temp:    SpO2: 98% 100%    Last Pain:  Vitals:   12/22/21 0804  TempSrc:   PainSc: 0-No pain                 Ilene Qua

## 2021-12-22 NOTE — H&P (Signed)
Midge Minium, MD [facility] [address] [place], [place] [address] [phone] Fax : [phone]  Primary Care Physician:  Luciana Axe, NP Primary Gastroenterologist:  Dr. Servando Snare  Pre-Procedure History & Physical: HPI:  Sheryl [name] is a 71 y.o. female is here for an endoscopy.   Past Medical History:  Diagnosis Date   Anesthesia complication    bradycardia   Anxiety    Arthritis    lower back, left hip    Chicken pox    Chronic kidney disease    Diabetes mellitus without complication (HCC)    GERD (gastroesophageal reflux disease)    History of hiatal hernia    History of kidney stones    Hypercholesterolemia    Hypertension    Migraines    rare now   Nephrolithiasis    PONV (postoperative nausea and vomiting)    VOMITED A LITTLE BIT AFTER KIDNEY STONE SURGERY    Past Surgical History:  Procedure Laterality Date   BREAST CYST ASPIRATION Left    negative 2012   CHOLECYSTECTOMY     COLONOSCOPY     COLONOSCOPY WITH PROPOFOL N/A 12/25/2020   Procedure: COLONOSCOPY WITH PROPOFOL;  Surgeon: Midge Minium, MD;  Location: [facility] ENDOSCOPY;  Service: Endoscopy;  Laterality: N/A;   COLONOSCOPY, ESOPHAGOGASTRODUODENOSCOPY (EGD) AND ESOPHAGEAL DILATION     CYSTOSCOPY W/ RETROGRADES Bilateral 12/04/2018   Procedure: CYSTOSCOPY WITH RETROGRADE PYELOGRAM;  Surgeon: Vanna Scotland, MD;  Location: [facility] ORS;  Service: Urology;  Laterality: Bilateral;   CYSTOSCOPY/URETEROSCOPY/HOLMIUM LASER/STENT PLACEMENT Left 06/26/2018   Procedure: CYSTOSCOPY/URETEROSCOPY/HOLMIUM LASER/STENT Exchange;  Surgeon: Vanna Scotland, MD;  Location: [facility]S;  Service: Urology;  Laterality: Left;   CYSTOSCOPY/URETEROSCOPY/HOLMIUM LASER/STENT PLACEMENT Right 12/04/2018   Procedure: CYSTOSCOPY/URETEROSCOPY/HOLMIUM LASER/STENT PLACEMENT;  Surgeon: Vanna Scotland, MD;  Location: [facility]S;  Service: Urology;  Laterality: Right;   DISTAL BICEPS TENDON REPAIR Right 01/19/2017   Procedure: BICEPS  TENODESIS;  Surgeon: Christena Flake, MD;  Location: [facility];  Service: Orthopedics;  Laterality: Right;  Diabetic - oral meds   ESOPHAGOGASTRODUODENOSCOPY (EGD) WITH PROPOFOL N/A 01/31/2015   Procedure: ESOPHAGOGASTRODUODENOSCOPY (EGD) WITH PROPOFOL;  Surgeon: Scot Jun, MD;  Location: [facility] ENDOSCOPY;  Service: Endoscopy;  Laterality: N/A;   ESOPHAGOGASTRODUODENOSCOPY (EGD) WITH PROPOFOL N/A 06/19/2020   Procedure: ESOPHAGOGASTRODUODENOSCOPY (EGD) WITH PROPOFOL;  Surgeon: Midge Minium, MD;  Location: [facility] ENDOSCOPY;  Service: Endoscopy;  Laterality: N/A;   ESOPHAGOGASTRODUODENOSCOPY (EGD) WITH PROPOFOL N/A 12/25/2020   Procedure: ESOPHAGOGASTRODUODENOSCOPY (EGD) WITH PROPOFOL;  Surgeon: Midge Minium, MD;  Location: [facility]COPY;  Service: Endoscopy;  Laterality: N/A;   esophogastroduodenoscopy     esophogastroduodeoscopy     HERNIA REPAIR     IR NEPHROSTOMY PLACEMENT LEFT  05/22/2018   NEPHROLITHOTOMY Left 05/22/2018   Procedure: NEPHROLITHOTOMY PERCUTANEOUS;  Surgeon: Vanna Scotland, MD;  Location: [facility] ORS;  Service: Urology;  Laterality: Left;   SAVORY DILATION N/A 01/31/2015   Procedure: SAVORY DILATION;  Surgeon: Scot Jun, MD;  Location: [facility] ENDOSCOPY;  Service: Endoscopy;  Laterality: N/A;   SHOULDER ARTHROSCOPY Right 01/19/2017   Procedure: ARTHROSCOPY SHOULDER DEBRIDEMENT DECOMPRESSION AND  ROTATOR CUFF REPAIR AND BICEPS TENODESIS;  Surgeon: Christena Flake, MD;  Location: [facility];  Service: Orthopedics;  Laterality: Right;    Prior to Admission medications   Medication Sig Start Date End Date Taking? Authorizing Provider  buPROPion (WELLBUTRIN XL) 300 MG 24 hr tablet Take 300 mg by mouth daily.   Yes [provider]  Cholecalciferol (VITAMIN D3) 50 MCG (  2000 UT) capsule Take 2,000 Units by mouth daily.   Yes [provider]  DULoxetine (CYMBALTA) 60 MG capsule Take 60 mg by mouth daily.   Yes [provider]  famotidine  (PEPCID) 10 MG tablet Take 10 mg by mouth every other day.    Yes [provider]  lisinopril (PRINIVIL,ZESTRIL) 5 MG tablet Take 600 mg by mouth every morning.   Yes [provider]  metFORMIN (GLUCOPHAGE) 500 MG tablet Take 500 mg by mouth 2 (two) times daily with a meal.   Yes [provider]  potassium chloride (KLOR-CON) 8 MEQ tablet Take 1 tablet twice daily for the next 3 days, then decrease to 1 tablet daily. 01/01/20  Yes [provider]  rosuvastatin (CRESTOR) 5 MG tablet Take 5 mg by mouth at bedtime.    Yes [provider]  tamsulosin (FLOMAX) 0.4 MG CAPS capsule TAKE 1 CAPSULE BY MOUTH EVERY DAY 03/21/19  Yes Vanna Scotland, MD  vitamin B-12 (CYANOCOBALAMIN) 1000 MCG tablet Take 1,000 mcg by mouth daily.   Yes [provider]  lisinopril-hydrochlorothiazide (ZESTORETIC) 20-25 MG tablet Take by mouth. 12/14/19 12/13/20  [provider]  sertraline (ZOLOFT) 100 MG tablet Take by mouth. 12/14/19 12/13/20  [provider]    Allergies as of 12/16/2021   (No Known Allergies)    History reviewed. No pertinent family history.  Social History   Socioeconomic History   Marital status: Married    Spouse name: Not on file   Number of children: Not on file   Years of education: Not on file   Highest education level: Not on file  Occupational History   Not on file  Tobacco Use   Smoking status: Former    Packs/day: 1.50    Years: 25.00    Total pack years: 37.50    Types: Cigarettes    Quit date: 2010    Years since quitting: 13.7   Smokeless tobacco: Never  Vaping Use   Vaping Use: Never used  Substance and Sexual Activity   Alcohol use: No   Drug use: No   Sexual activity: Yes    Birth control/protection: Post-menopausal  Other Topics Concern   Not on file  Social History Narrative   Not on file   Social Determinants of Health   Financial Resource Strain: Not on file  Food Insecurity: Not on file   Transportation Needs: Not on file  Physical Activity: Not on file  Stress: Not on file  Social Connections: Not on file  Intimate Partner Violence: Not on file    Review of Systems: See HPI, otherwise negative ROS  Physical Exam: BP (!) 144/75   Pulse 88   Temp (!) 97.5 F (36.4 C) (Temporal)   Resp 18   Ht 5\' 3"  (1.6 m)   Wt 70.3 kg   SpO2 98%   BMI 27.46 kg/m  General:   Alert,  pleasant and cooperative in NAD Head:  Normocephalic and atraumatic. Neck:  Supple; no masses or thyromegaly. Lungs:  Clear throughout to auscultation.    Heart:  Regular rate and rhythm. Abdomen:  Soft, nontender and nondistended. Normal bowel sounds, without guarding, and without rebound.   Neurologic:  Alert and  oriented x4;  grossly normal neurologically.  Impression/Plan: LEESA LEIFHEIT is here for an endoscopy to be performed for dysphagia  Risks, benefits, limitations, and alternatives regarding  endoscopy have been reviewed with the patient.  Questions have been answered.  All parties agreeable.  Lucilla Lame, MD  12/22/2021, 7:22 AM

## 2021-12-23 ENCOUNTER — Encounter: Payer: Self-pay | Admitting: Gastroenterology

## 2021-12-29 ENCOUNTER — Other Ambulatory Visit: Payer: Self-pay | Admitting: Gastroenterology

## 2021-12-29 DIAGNOSIS — K222 Esophageal obstruction: Secondary | ICD-10-CM

## 2022-01-04 ENCOUNTER — Other Ambulatory Visit: Payer: Self-pay

## 2022-01-04 ENCOUNTER — Ambulatory Visit
Admission: RE | Admit: 2022-01-04 | Discharge: 2022-01-04 | Disposition: A | Discharge status: Home / Self Care | Payer: Medicare PPO | Source: Ambulatory Visit | Attending: Urology | Admitting: Urology

## 2022-01-04 DIAGNOSIS — N200 Calculus of kidney: Secondary | ICD-10-CM | POA: Insufficient documentation

## 2022-01-05 ENCOUNTER — Ambulatory Visit: Payer: Medicare PPO | Admitting: Urology

## 2022-01-05 VITALS — BP 131/80 | HR 98 | Ht 63.0 in | Wt 165.6 lb

## 2022-01-05 DIAGNOSIS — N200 Calculus of kidney: Secondary | ICD-10-CM | POA: Diagnosis not present

## 2022-01-05 NOTE — Progress Notes (Signed)
01/05/2022 7:59 AM   Sheryl [name] 1950-09-11 [id number]  Referring provider: Latanya Maudlin, NP [address] [place],  [place] [address]  No chief complaint on file.   HPI: 55 female with a personal history of kidney stones returns today for follow-up.  She underwent right ureteroscopy on 11/2018 for 14 mm right lower pole stone as well as left PCNL followed by staged ureteroscopy.  KUB today shows interval stone burden, left greater than right up to 11.5 mm in the left lower pole.  There is also a thin calcification on the right possibly representing a stone.  She reports that over the past 2 years, she has been doing okay.  She did have a twinge of left flank pain on occasion but this not severe.  She admits that she still not drinking enough water.  Previous stone analysis showed 100% calcium oxalate monohydrate.  76-PPJK urine metabolic evaluation showed very low urinary volume of 1.27 L over 24-hour period.  In addition, her urinary oxalate was 38 mg/day, borderline hyperoxaluria.  Her urinary pH was also low, 5.28.  Urinary calcium low, 97 mg/day.  Urinary citrate sufficient 757 mg/day.  Creatinine 0.73.  Serum calcium 9.5.  Magnesium 1.9.  CO2 26.  Socially, she had a really tough year.  Her husband died in 05/17/2022 and she is still experiencing significant grief and loneliness.   PMH: Past Medical History:  Diagnosis Date   Anesthesia complication    bradycardia   Anxiety    Arthritis    lower back, left hip    Chicken pox    Chronic kidney disease    Diabetes mellitus without complication (HCC)    GERD (gastroesophageal reflux disease)    History of hiatal hernia    History of kidney stones    Hypercholesterolemia    Hypertension    Migraines    rare now   Nephrolithiasis    PONV (postoperative nausea and vomiting)    VOMITED A LITTLE BIT AFTER KIDNEY STONE SURGERY    Surgical History: Past Surgical History:  Procedure Laterality Date   BREAST CYST  ASPIRATION Left    negative 2012   CHOLECYSTECTOMY     COLONOSCOPY     COLONOSCOPY WITH PROPOFOL N/A 12/25/2020   Procedure: COLONOSCOPY WITH PROPOFOL;  Surgeon: Lucilla Lame, MD;  Location: [facility] ENDOSCOPY;  Service: Endoscopy;  Laterality: N/A;   COLONOSCOPY, ESOPHAGOGASTRODUODENOSCOPY (EGD) AND ESOPHAGEAL DILATION     CYSTOSCOPY W/ RETROGRADES Bilateral 12/04/2018   Procedure: CYSTOSCOPY WITH RETROGRADE PYELOGRAM;  Surgeon: Hollice Espy, MD;  Location: [facility];  Service: Urology;  Laterality: Bilateral;   CYSTOSCOPY/URETEROSCOPY/HOLMIUM LASER/STENT PLACEMENT Left 06/26/2018   Procedure: CYSTOSCOPY/URETEROSCOPY/HOLMIUM LASER/STENT Exchange;  Surgeon: Hollice Espy, MD;  Location: [facility];  Service: Urology;  Laterality: Left;   CYSTOSCOPY/URETEROSCOPY/HOLMIUM LASER/STENT PLACEMENT Right 12/04/2018   Procedure: CYSTOSCOPY/URETEROSCOPY/HOLMIUM LASER/STENT PLACEMENT;  Surgeon: Hollice Espy, MD;  Location: [facility];  Service: Urology;  Laterality: Right;   DISTAL BICEPS TENDON REPAIR Right 01/19/2017   Procedure: BICEPS TENODESIS;  Surgeon: Corky Mull, MD;  Location: [place];  Service: Orthopedics;  Laterality: Right;  Diabetic - oral meds   ESOPHAGOGASTRODUODENOSCOPY (EGD) WITH PROPOFOL N/A 01/31/2015   Procedure: ESOPHAGOGASTRODUODENOSCOPY (EGD) WITH PROPOFOL;  Surgeon: Manya Silvas, MD;  Location: [facility] ENDOSCOPY;  Service: Endoscopy;  Laterality: N/A;   ESOPHAGOGASTRODUODENOSCOPY (EGD) WITH PROPOFOL N/A 06/19/2020   Procedure: ESOPHAGOGASTRODUODENOSCOPY (EGD) WITH PROPOFOL;  Surgeon: Lucilla Lame, MD;  Location: [facility] ENDOSCOPY;  Service: Endoscopy;  Laterality: N/A;  ESOPHAGOGASTRODUODENOSCOPY (EGD) WITH PROPOFOL N/A 12/25/2020   Procedure: ESOPHAGOGASTRODUODENOSCOPY (EGD) WITH PROPOFOL;  Surgeon: Midge Minium, MD;  Location: [facility] ENDOSCOPY;  Service: Endoscopy;  Laterality: N/A;   ESOPHAGOGASTRODUODENOSCOPY (EGD) WITH PROPOFOL N/A 12/22/2021   Procedure:  ESOPHAGOGASTRODUODENOSCOPY (EGD) WITH PROPOFOL;  Surgeon: Midge Minium, MD;  Location: [facility] ENDOSCOPY;  Service: Endoscopy;  Laterality: N/A;   esophogastroduodenoscopy     esophogastroduodeoscopy     HERNIA REPAIR     IR NEPHROSTOMY PLACEMENT LEFT  05/22/2018   NEPHROLITHOTOMY Left 05/22/2018   Procedure: NEPHROLITHOTOMY PERCUTANEOUS;  Surgeon: Vanna Scotland, MD;  Location: [facility];  Service: Urology;  Laterality: Left;   SAVORY DILATION N/A 01/31/2015   Procedure: SAVORY DILATION;  Surgeon: Scot Jun, MD;  Location: [facility] ENDOSCOPY;  Service: Endoscopy;  Laterality: N/A;   SHOULDER ARTHROSCOPY Right 01/19/2017   Procedure: ARTHROSCOPY SHOULDER DEBRIDEMENT DECOMPRESSION AND  ROTATOR CUFF REPAIR AND BICEPS TENODESIS;  Surgeon: Christena Flake, MD;  Location: [facility];  Service: Orthopedics;  Laterality: Right;    Home Medications:  Allergies as of 01/05/2022   No Known Allergies      Medication List        Accurate as of January 05, 2022  7:59 AM. If you have any questions, ask your nurse or doctor.          buPROPion 300 MG 24 hr tablet Commonly known as: WELLBUTRIN XL Take 300 mg by mouth daily.   cyanocobalamin 1000 MCG tablet Commonly known as: VITAMIN B12 Take 1,000 mcg by mouth daily.   DULoxetine 60 MG capsule Commonly known as: CYMBALTA Take 60 mg by mouth daily.   famotidine 10 MG tablet Commonly known as: PEPCID Take 10 mg by mouth every other day.   lisinopril 5 MG tablet Commonly known as: ZESTRIL Take 600 mg by mouth every morning.   lisinopril-hydrochlorothiazide 20-25 MG tablet Commonly known as: ZESTORETIC Take by mouth.   metFORMIN 500 MG tablet Commonly known as: GLUCOPHAGE Take 500 mg by mouth 2 (two) times daily with a meal.   potassium chloride 8 MEQ tablet Commonly known as: KLOR-CON Take 1 tablet twice daily for the next 3 days, then decrease to 1 tablet daily.   rosuvastatin 5 MG tablet Commonly known as:  CRESTOR Take 5 mg by mouth at bedtime.   sertraline 100 MG tablet Commonly known as: ZOLOFT Take by mouth.   tamsulosin 0.4 MG Caps capsule Commonly known as: FLOMAX TAKE 1 CAPSULE BY MOUTH EVERY DAY   Vitamin D3 50 MCG (2000 UT) capsule Take 2,000 Units by mouth daily.        Allergies: No Known Allergies  Family History: No family history on file.  Social History:  reports that she quit smoking about 13 years ago. Her smoking use included cigarettes. She has a 37.50 pack-year smoking history. She has never used smokeless tobacco. She reports that she does not drink alcohol and does not use drugs.   Physical Exam: There were no vitals taken for this visit.  Constitutional:  Alert and oriented, No acute distress. HEENT: [place] AT, moist mucus membranes.  Trachea midline, no masses. Cardiovascular: No clubbing, cyanosis, or edema. Respiratory: Normal respiratory effort, no increased work of breathing. GI: Abdomen is soft, nontender, nondistended, no abdominal masses GU: No CVA tenderness Skin: No rashes, bruises or suspicious lesions. Neurologic: Grossly intact, no focal deficits, moving all 4 extremities. Psychiatric: Normal mood and affect.  Laboratory Data: Lab Results  Component Value Date   WBC 11.5 (H)  12/10/2018   HGB 14.3 12/10/2018   HCT 43.0 12/10/2018   MCV 91.7 12/10/2018   PLT 291 12/10/2018    Lab Results  Component Value Date   CREATININE 1.00 03/18/2021     Pertinent Imaging: KUB was personally reviewed today-see above interpretation.  Interval progression of left lower pole stone burden and possibly calcification on the right.  Assessment & Plan:    1. Nephrolithiasis Interval increase in left lower pole stone burden up to nearly 12 mm previously 2 to 3 mm  She is minimally or completely asymptomatic from this  Given that she has had a tough year, she is not interested in pursuing any treatment options at this time.  We did discuss  alternatives including ureteroscopy versus ESWL.  She prefer conservative approach.  Given that she clearly has metabolically active stone disease, we had a lengthy discussion today about behavior changes as a relates to water intake.  She is still really bad about this.  She prefers to drink a Coke or Pepsi and does not drink almost any water and is chronically dehydrated.  We discussed strategies and behaviors in order to slow stone production.  Follow-up in 1 year with KUB or sooner as needed - Abdomen 1 view (KUB); Future    Vanna Scotland, MD  [facility] [address] [place], [place] [address] [phone]

## 2022-01-26 ENCOUNTER — Other Ambulatory Visit: Payer: Self-pay | Admitting: Student

## 2022-01-26 DIAGNOSIS — M7061 Trochanteric bursitis, right hip: Secondary | ICD-10-CM

## 2022-01-28 ENCOUNTER — Ambulatory Visit
Admission: RE | Admit: 2022-01-28 | Discharge: 2022-01-28 | Disposition: A | Discharge status: Home / Self Care | Payer: Medicare PPO | Attending: Gastroenterology | Admitting: Gastroenterology

## 2022-01-28 ENCOUNTER — Encounter: Payer: Self-pay | Admitting: Gastroenterology

## 2022-01-28 ENCOUNTER — Encounter
Admission: RE | Disposition: A | Discharge status: Home / Self Care | Payer: Self-pay | Source: Home / Self Care | Attending: Gastroenterology

## 2022-01-28 ENCOUNTER — Ambulatory Visit: Payer: Medicare PPO | Admitting: Anesthesiology

## 2022-01-28 ENCOUNTER — Other Ambulatory Visit: Payer: Self-pay

## 2022-01-28 DIAGNOSIS — Z87891 Personal history of nicotine dependence: Secondary | ICD-10-CM | POA: Diagnosis not present

## 2022-01-28 DIAGNOSIS — E7800 Pure hypercholesterolemia, unspecified: Secondary | ICD-10-CM | POA: Diagnosis not present

## 2022-01-28 DIAGNOSIS — N189 Chronic kidney disease, unspecified: Secondary | ICD-10-CM | POA: Insufficient documentation

## 2022-01-28 DIAGNOSIS — Z7984 Long term (current) use of oral hypoglycemic drugs: Secondary | ICD-10-CM | POA: Insufficient documentation

## 2022-01-28 DIAGNOSIS — E1122 Type 2 diabetes mellitus with diabetic chronic kidney disease: Secondary | ICD-10-CM | POA: Insufficient documentation

## 2022-01-28 DIAGNOSIS — K219 Gastro-esophageal reflux disease without esophagitis: Secondary | ICD-10-CM | POA: Insufficient documentation

## 2022-01-28 DIAGNOSIS — K449 Diaphragmatic hernia without obstruction or gangrene: Secondary | ICD-10-CM | POA: Insufficient documentation

## 2022-01-28 DIAGNOSIS — Z79899 Other long term (current) drug therapy: Secondary | ICD-10-CM | POA: Insufficient documentation

## 2022-01-28 DIAGNOSIS — K222 Esophageal obstruction: Secondary | ICD-10-CM | POA: Diagnosis not present

## 2022-01-28 DIAGNOSIS — R1310 Dysphagia, unspecified: Secondary | ICD-10-CM | POA: Diagnosis present

## 2022-01-28 DIAGNOSIS — F419 Anxiety disorder, unspecified: Secondary | ICD-10-CM | POA: Insufficient documentation

## 2022-01-28 DIAGNOSIS — I129 Hypertensive chronic kidney disease with stage 1 through stage 4 chronic kidney disease, or unspecified chronic kidney disease: Secondary | ICD-10-CM | POA: Diagnosis not present

## 2022-01-28 HISTORY — PX: ESOPHAGOGASTRODUODENOSCOPY (EGD) WITH PROPOFOL: SHX5813

## 2022-01-28 LAB — GLUCOSE, CAPILLARY: Glucose-Capillary: 104 mg/dL — ABNORMAL HIGH (ref 70–99)

## 2022-01-28 SURGERY — ESOPHAGOGASTRODUODENOSCOPY (EGD) WITH PROPOFOL
Anesthesia: General

## 2022-01-28 MED ORDER — LIDOCAINE HCL (CARDIAC) PF 100 MG/5ML IV SOSY
PREFILLED_SYRINGE | INTRAVENOUS | Status: DC | PRN
  Administered 2022-01-28: 100 mg via INTRAVENOUS

## 2022-01-28 MED ORDER — PROPOFOL 1000 MG/100ML IV EMUL
INTRAVENOUS | Status: AC
  Filled 2022-01-28: qty 100

## 2022-01-28 MED ORDER — SODIUM CHLORIDE 0.9 % IV SOLN: INTRAVENOUS | Status: DC

## 2022-01-28 MED ORDER — LIDOCAINE HCL (PF) 2 % IJ SOLN
INTRAMUSCULAR | Status: AC
  Filled 2022-01-28: qty 10

## 2022-01-28 MED ORDER — PROPOFOL 10 MG/ML IV BOLUS
INTRAVENOUS | Status: DC | PRN
  Administered 2022-01-28: 30 mg via INTRAVENOUS
  Administered 2022-01-28: 100 mg via INTRAVENOUS

## 2022-01-28 NOTE — H&P (Signed)
Midge Minium, MD [facility] [address], [address] [place], [place] [address] Phone:[phone] Fax : [phone]  Primary Care Physician:  Luciana Axe, NP Primary Gastroenterologist:  Dr. Servando Snare  Pre-Procedure History & Physical: HPI:  Sheryl [name] is a 71 y.o. female is here for an endoscopy.   Past Medical History:  Diagnosis Date   Anesthesia complication    bradycardia   Anxiety    Arthritis    lower back, left hip    Chicken pox    Chronic kidney disease    Diabetes mellitus without complication (HCC)    GERD (gastroesophageal reflux disease)    History of hiatal hernia    History of kidney stones    Hypercholesterolemia    Hypertension    Migraines    rare now   Nephrolithiasis    PONV (postoperative nausea and vomiting)    VOMITED A LITTLE BIT AFTER KIDNEY STONE SURGERY    Past Surgical History:  Procedure Laterality Date   BREAST CYST ASPIRATION Left    negative 2012   CHOLECYSTECTOMY     COLONOSCOPY     COLONOSCOPY WITH PROPOFOL N/A 12/25/2020   Procedure: COLONOSCOPY WITH PROPOFOL;  Surgeon: Midge Minium, MD;  Location: [facility] ENDOSCOPY;  Service: Endoscopy;  Laterality: N/A;   COLONOSCOPY, ESOPHAGOGASTRODUODENOSCOPY (EGD) AND ESOPHAGEAL DILATION     CYSTOSCOPY W/ RETROGRADES Bilateral 12/04/2018   Procedure: CYSTOSCOPY WITH RETROGRADE PYELOGRAM;  Surgeon: Vanna Scotland, MD;  Location: [facility];  Service: Urology;  Laterality: Bilateral;   CYSTOSCOPY/URETEROSCOPY/HOLMIUM LASER/STENT PLACEMENT Left 06/26/2018   Procedure: CYSTOSCOPY/URETEROSCOPY/HOLMIUM LASER/STENT Exchange;  Surgeon: Vanna Scotland, MD;  Location: [facility];  Service: Urology;  Laterality: Left;   CYSTOSCOPY/URETEROSCOPY/HOLMIUM LASER/STENT PLACEMENT Right 12/04/2018   Procedure: CYSTOSCOPY/URETEROSCOPY/HOLMIUM LASER/STENT PLACEMENT;  Surgeon: Vanna Scotland, MD;  Location: [facility];  Service: Urology;  Laterality: Right;   DISTAL BICEPS TENDON REPAIR Right 01/19/2017   Procedure: BICEPS  TENODESIS;  Surgeon: Christena Flake, MD;  Location: [facility];  Service: Orthopedics;  Laterality: Right;  Diabetic - oral meds   ESOPHAGOGASTRODUODENOSCOPY (EGD) WITH PROPOFOL N/A 01/31/2015   Procedure: ESOPHAGOGASTRODUODENOSCOPY (EGD) WITH PROPOFOL;  Surgeon: Scot Jun, MD;  Location: [facility] ENDOSCOPY;  Service: Endoscopy;  Laterality: N/A;   ESOPHAGOGASTRODUODENOSCOPY (EGD) WITH PROPOFOL N/A 06/19/2020   Procedure: ESOPHAGOGASTRODUODENOSCOPY (EGD) WITH PROPOFOL;  Surgeon: Midge Minium, MD;  Location: [facility] ENDOSCOPY;  Service: Endoscopy;  Laterality: N/A;   ESOPHAGOGASTRODUODENOSCOPY (EGD) WITH PROPOFOL N/A 12/25/2020   Procedure: ESOPHAGOGASTRODUODENOSCOPY (EGD) WITH PROPOFOL;  Surgeon: Midge Minium, MD;  Location: [facility] ENDOSCOPY;  Service: Endoscopy;  Laterality: N/A;   ESOPHAGOGASTRODUODENOSCOPY (EGD) WITH PROPOFOL N/A 12/22/2021   Procedure: ESOPHAGOGASTRODUODENOSCOPY (EGD) WITH PROPOFOL;  Surgeon: Midge Minium, MD;  Location: [facility] ENDOSCOPY;  Service: Endoscopy;  Laterality: N/A;   esophogastroduodenoscopy     esophogastroduodeoscopy     HERNIA REPAIR     IR NEPHROSTOMY PLACEMENT LEFT  05/22/2018   NEPHROLITHOTOMY Left 05/22/2018   Procedure: NEPHROLITHOTOMY PERCUTANEOUS;  Surgeon: Vanna Scotland, MD;  Location: [facility];  Service: Urology;  Laterality: Left;   SAVORY DILATION N/A 01/31/2015   Procedure: SAVORY DILATION;  Surgeon: Scot Jun, MD;  Location[facility] ENDOSCOPY;  Service: Endoscopy;  Laterality: N/A;   SHOULDER ARTHROSCOPY Right 01/19/2017   Procedure: ARTHROSCOPY SHOULDER DEBRIDEMENT DECOMPRESSION AND  ROTATOR CUFF REPAIR AND BICEPS TENODESIS;  Surgeon: Christena Flake, MD;  Location: [facility];  Service: Orthopedics;  Laterality: Right;    Prior to Admission medications   Medication Sig Start Date End Date  Taking? Authorizing Provider  buPROPion (WELLBUTRIN XL) 300 MG 24 hr tablet Take 300 mg by mouth daily.   Yes [provider]   DULoxetine (CYMBALTA) 60 MG capsule Take 60 mg by mouth daily.   Yes [provider]  lisinopril (PRINIVIL,ZESTRIL) 5 MG tablet Take 600 mg by mouth every morning.   Yes [provider]  metFORMIN (GLUCOPHAGE) 500 MG tablet Take 500 mg by mouth 2 (two) times daily with a meal.   Yes [provider]  pantoprazole (PROTONIX) 40 MG tablet Take by mouth. 11/03/21  Yes [provider]  rosuvastatin (CRESTOR) 5 MG tablet Take 5 mg by mouth at bedtime.    Yes [provider]  traZODone (DESYREL) 50 MG tablet Take 50 mg by mouth at bedtime. 01/02/22  Yes [provider]  Cholecalciferol (VITAMIN D3) 50 MCG (2000 UT) capsule Take 2,000 Units by mouth daily. Patient not taking: Reported on 01/28/2022    [provider]  lisinopril-hydrochlorothiazide (ZESTORETIC) 20-25 MG tablet Take by mouth. 12/14/19 12/22/21  [provider]  potassium chloride (KLOR-CON) 8 MEQ tablet Take 1 tablet twice daily for the next 3 days, then decrease to 1 tablet daily. Patient not taking: Reported on 01/28/2022 01/01/20   [provider]  tiZANidine (ZANAFLEX) 2 MG tablet TAKE 1 TABLET BY MOUTH NIGHTLY AS NEEDED. Patient not taking: Reported on 01/28/2022 12/12/20   [provider]  vitamin B-12 (CYANOCOBALAMIN) 1000 MCG tablet Take 1,000 mcg by mouth daily. Patient not taking: Reported on 01/28/2022    [provider]    Allergies as of 12/30/2021   (No Known Allergies)    History reviewed. No pertinent family history.  Social History   Socioeconomic History   Marital status: Married    Spouse name: Not on file   Number of children: Not on file   Years of education: Not on file   Highest education level: Not on file  Occupational History   Not on file  Tobacco Use   Smoking status: Former    Packs/day: 1.50    Years: 25.00    Total pack years: 37.50    Types: Cigarettes    Quit date: 2010    Years since quitting:  13.8   Smokeless tobacco: Never  Vaping Use   Vaping Use: Never used  Substance and Sexual Activity   Alcohol use: No   Drug use: No   Sexual activity: Yes    Birth control/protection: Post-menopausal  Other Topics Concern   Not on file  Social History Narrative   Not on file   Social Determinants of Health   Financial Resource Strain: Not on file  Food Insecurity: Not on file  Transportation Needs: Not on file  Physical Activity: Not on file  Stress: Not on file  Social Connections: Not on file  Intimate Partner Violence: Not on file    Review of Systems: See HPI, otherwise negative ROS  Physical Exam: There were no vitals taken for this visit. General:   Alert,  pleasant and cooperative in NAD Head:  Normocephalic and atraumatic. Neck:  Supple; no masses or thyromegaly. Lungs:  Clear throughout to auscultation.    Heart:  Regular rate and rhythm. Abdomen:  Soft, nontender and nondistended. Normal bowel sounds, without guarding, and without rebound.   Neurologic:  Alert and  oriented x4;  grossly normal neurologically.  Impression/Plan: Sheryl [name] is here for an endoscopy to be performed for dysphagia  Risks, benefits, limitations, and alternatives  regarding  endoscopy have been reviewed with the patient.  Questions have been answered.  All parties agreeable.   Lucilla Lame, MD  01/28/2022, 7:53 AM

## 2022-01-28 NOTE — Transfer of Care (Signed)
Immediate Anesthesia Transfer of Care Note  Patient: ELLOISE ROARK  Procedure(s) Performed: ESOPHAGOGASTRODUODENOSCOPY (EGD) WITH PROPOFOL  Patient Location: Endoscopy Unit  Anesthesia Type:General  Level of Consciousness: drowsy  Airway & Oxygen Therapy: Patient Spontanous Breathing and Patient connected to nasal cannula oxygen  Post-op Assessment: Report given to RN, Post -op Vital signs reviewed and stable and Patient moving all extremities  Post vital signs: Reviewed and stable  Last Vitals:  Vitals Value Taken Time  BP 131/69 01/28/22 0852  Temp 35.8 C 01/28/22 0852  Pulse 77 01/28/22 0852  Resp 15 01/28/22 0852  SpO2 100 % 01/28/22 0852  Vitals shown include unvalidated device data.  Last Pain:  Vitals:   01/28/22 0852  TempSrc: Temporal  PainSc:          Complications: No notable events documented.

## 2022-01-28 NOTE — Anesthesia Preprocedure Evaluation (Signed)
Anesthesia Evaluation  Patient identified by MRN, date of birth, ID band Patient awake    Reviewed: Allergy & Precautions, H&P , NPO status , Patient's Chart, lab work & pertinent test results, reviewed documented beta blocker date and time   History of Anesthesia Complications (+) PONV and history of anesthetic complications  Airway Mallampati: II   Neck ROM: full    Dental  (+) Poor Dentition   Pulmonary neg pulmonary ROS, former smoker,    Pulmonary exam normal        Cardiovascular Exercise Tolerance: Poor hypertension, On Medications negative cardio ROS Normal cardiovascular exam Rhythm:regular Rate:Normal     Neuro/Psych  Headaches, Anxiety negative psych ROS   GI/Hepatic Neg liver ROS, hiatal hernia, GERD  Medicated,  Endo/Other  negative endocrine ROSdiabetes, Well Controlled  Renal/GU CRFRenal disease  negative genitourinary   Musculoskeletal   Abdominal   Peds  Hematology negative hematology ROS (+)   Anesthesia Other Findings Past Medical History: No date: Anesthesia complication     Comment:  bradycardia No date: Anxiety No date: Arthritis     Comment:  lower back, left hip  No date: Chicken pox No date: Chronic kidney disease No date: Diabetes mellitus without complication (HCC) No date: GERD (gastroesophageal reflux disease) No date: History of hiatal hernia No date: History of kidney stones No date: Hypercholesterolemia No date: Hypertension No date: Migraines     Comment:  rare now No date: Nephrolithiasis No date: PONV (postoperative nausea and vomiting)     Comment:  VOMITED A LITTLE BIT AFTER KIDNEY STONE SURGERY Past Surgical History: No date: BREAST CYST ASPIRATION; Left     Comment:  negative 2012 No date: CHOLECYSTECTOMY No date: COLONOSCOPY 12/25/2020: COLONOSCOPY WITH PROPOFOL; N/A     Comment:  Procedure: COLONOSCOPY WITH PROPOFOL;  Surgeon: Midge Minium, MD;   Location: [facility] ENDOSCOPY;  Service:               Endoscopy;  Laterality: N/A; No date: COLONOSCOPY, ESOPHAGOGASTRODUODENOSCOPY (EGD) AND ESOPHAGEAL  DILATION 12/04/2018: CYSTOSCOPY W/ RETROGRADES; Bilateral     Comment:  Procedure: CYSTOSCOPY WITH RETROGRADE PYELOGRAM;                Surgeon: Vanna Scotland, MD;  Location: [facility] ORS;                Service: Urology;  Laterality: Bilateral; 06/26/2018: CYSTOSCOPY/URETEROSCOPY/HOLMIUM LASER/STENT PLACEMENT; Left     Comment:  Procedure: CYSTOSCOPY/URETEROSCOPY/HOLMIUM LASER/STENT               Exchange;  Surgeon: Vanna Scotland, MD;  Location: [facility]               ORS;  Service: Urology;  Laterality: Left; 12/04/2018: CYSTOSCOPY/URETEROSCOPY/HOLMIUM LASER/STENT PLACEMENT;  Right     Comment:  Procedure: CYSTOSCOPY/URETEROSCOPY/HOLMIUM LASER/STENT               PLACEMENT;  Surgeon: Vanna Scotland, MD;  Location: [facility]              ORS;  Service: Urology;  Laterality: Right; 01/19/2017: DISTAL BICEPS TENDON REPAIR; Right     Comment:  Procedure: BICEPS TENODESIS;  Surgeon: Christena Flake,               MD;  Location: [facility];  Service:               Orthopedics;  Laterality: Right;  Diabetic - oral  meds 01/31/2015: ESOPHAGOGASTRODUODENOSCOPY (EGD) WITH PROPOFOL; N/A     Comment:  Procedure: ESOPHAGOGASTRODUODENOSCOPY (EGD) WITH               PROPOFOL;  Surgeon: Manya Silvas, MD;  Location: [facility]              ENDOSCOPY;  Service: Endoscopy;  Laterality: N/A; 06/19/2020: ESOPHAGOGASTRODUODENOSCOPY (EGD) WITH PROPOFOL; N/A     Comment:  Procedure: ESOPHAGOGASTRODUODENOSCOPY (EGD) WITH               PROPOFOL;  Surgeon: Lucilla Lame, MD;  Location: [facility]               ENDOSCOPY;  Service: Endoscopy;  Laterality: N/A; 12/25/2020: ESOPHAGOGASTRODUODENOSCOPY (EGD) WITH PROPOFOL; N/A     Comment:  Procedure: ESOPHAGOGASTRODUODENOSCOPY (EGD) WITH               PROPOFOL;  Surgeon: Lucilla Lame, MD;  Location: [facility]               ENDOSCOPY;   Service: Endoscopy;  Laterality: N/A; 12/22/2021: ESOPHAGOGASTRODUODENOSCOPY (EGD) WITH PROPOFOL; N/A     Comment:  Procedure: ESOPHAGOGASTRODUODENOSCOPY (EGD) WITH               PROPOFOL;  Surgeon: Lucilla Lame, MD;  Location: [facility]               ENDOSCOPY;  Service: Endoscopy;  Laterality: N/A; No date: esophogastroduodenoscopy No date: esophogastroduodeoscopy No date: HERNIA REPAIR 05/22/2018: IR NEPHROSTOMY PLACEMENT LEFT 05/22/2018: NEPHROLITHOTOMY; Left     Comment:  Procedure: NEPHROLITHOTOMY PERCUTANEOUS;  Surgeon:               Hollice Espy, MD;  Location: [facility] ORS;  Service:               Urology;  Laterality: Left; 01/31/2015: SAVORY DILATION; N/A     Comment:  Procedure: SAVORY DILATION;  Surgeon: Manya Silvas,               MD;  Location: [facility] ENDOSCOPY;  Service: Endoscopy;                Laterality: N/A; 01/19/2017: SHOULDER ARTHROSCOPY; Right     Comment:  Procedure: ARTHROSCOPY SHOULDER DEBRIDEMENT               DECOMPRESSION AND  ROTATOR CUFF REPAIR AND BICEPS               TENODESIS;  Surgeon: Corky Mull, MD;  Location: [place];  Service: Orthopedics;  Laterality: Right; BMI    Body Mass Index: 27.90 kg/m     Reproductive/Obstetrics negative OB ROS                             Anesthesia Physical Anesthesia Plan  ASA: 3  Anesthesia Plan: General   Post-op Pain Management:    Induction:   PONV Risk Score and Plan:   Airway Management Planned:   Additional Equipment:   Intra-op Plan:   Post-operative Plan:   Informed Consent: I have reviewed the patients History and Physical, chart, labs and discussed the procedure including the risks, benefits and alternatives for the proposed anesthesia with the patient or authorized representative who has indicated his/her understanding and acceptance.     Dental Advisory Given  Plan Discussed with: CRNA  Anesthesia Plan Comments:  Anesthesia Quick Evaluation  

## 2022-01-28 NOTE — Op Note (Signed)
[facility] Gastroenterology Patient Name: Sheryl [name] Procedure Date: 01/28/2022 8:26 AM MRN: [id number] Account #: [id number] Date of Birth: 02/14/1951 Admit Type: Outpatient Age: 71 Room: [facility] ENDO ROOM 4 Gender: Female Note Status: Finalized Instrument Name: Altamese Cabal Endoscope [id number] Procedure:             Upper GI endoscopy Indications:           Dysphagia Providers:             Lucilla Lame MD, MD Referring MD:          No Local Md, MD (Referring MD) Coralie Common. Coward Medicines:             Propofol per Anesthesia Complications:         No immediate complications. Procedure:             Pre-Anesthesia Assessment:                        - Prior to the procedure, a History and Physical was                         performed, and patient medications and allergies were                         reviewed. The patient's tolerance of previous                         anesthesia was also reviewed. The risks and benefits                         of the procedure and the sedation options and risks                         were discussed with the patient. All questions were                         answered, and informed consent was obtained. Prior                         Anticoagulants: The patient has taken no anticoagulant                         or antiplatelet agents. ASA Grade Assessment: II - A                         patient with mild systemic disease. After reviewing                         the risks and benefits, the patient was deemed in                         satisfactory condition to undergo the procedure.                        After obtaining informed consent, the endoscope was                         passed under direct vision. Throughout the procedure,  the patient's blood pressure, pulse, and oxygen                         saturations were monitored continuously. The Endoscope                         was introduced through the mouth,  and advanced to the                         second part of duodenum. The upper GI endoscopy was                         accomplished without difficulty. The patient tolerated                         the procedure well. Findings:      A medium-sized hiatal hernia was present.      One benign-appearing, intrinsic moderate stenosis was found in the lower       third of the esophagus. This stenosis measured 1.5 cm (inner diameter).       The stenosis was traversed. A TTS dilator was passed through the scope.       Dilation with a 15-16.5-18 mm balloon dilator was performed to 18 mm.       The dilation site was examined following endoscope reinsertion and       showed complete resolution of luminal narrowing.      The stomach was normal.      The examined duodenum was normal. Impression:            - Medium-sized hiatal hernia.                        - Benign-appearing esophageal stenosis. Dilated.                        - Normal stomach.                        - Normal examined duodenum.                        - No specimens collected. Recommendation:        - Discharge patient to home.                        - Resume previous diet.                        - Continue present medications. Procedure Code(s):     --- Professional ---                        [phone], Esophagogastroduodenoscopy, flexible,                         transoral; with transendoscopic balloon dilation of                         esophagus (less than 30 mm diameter) Diagnosis Code(s):     --- Professional ---  R13.10, Dysphagia, unspecified                        K22.2, Esophageal obstruction CPT copyright 2022 American [employer] Association. All rights reserved. The codes documented in this report are preliminary and upon coder review may  be revised to meet current compliance requirements. Lucilla Lame MD, MD 01/28/2022 8:48:51 AM This report has been signed electronically. Number of Addenda: 0 Note  Initiated On: 01/28/2022 8:26 AM Estimated Blood Loss:  Estimated blood loss: none.      [facility]

## 2022-01-29 ENCOUNTER — Encounter: Payer: Self-pay | Admitting: Gastroenterology

## 2022-02-02 NOTE — Anesthesia Postprocedure Evaluation (Signed)
Anesthesia Post Note  Patient: Sheryl [name]  Procedure(s) Performed: ESOPHAGOGASTRODUODENOSCOPY (EGD) WITH PROPOFOL  Patient location during evaluation: PACU Anesthesia Type: General Level of consciousness: awake and alert Pain management: pain level controlled Vital Signs Assessment: post-procedure vital signs reviewed and stable Respiratory status: spontaneous breathing, nonlabored ventilation, respiratory function stable and patient connected to nasal cannula oxygen Cardiovascular status: blood pressure returned to baseline and stable Postop Assessment: no apparent nausea or vomiting Anesthetic complications: no   No notable events documented.   Last Vitals:  Vitals:   01/28/22 0902 01/28/22 0912  BP: 130/70   Pulse: 77 82  Resp:    Temp:    SpO2: 97% 98%    Last Pain:  Vitals:   01/29/22 0741  TempSrc:   PainSc: 0-No pain                 Molli Barrows

## 2022-02-15 ENCOUNTER — Ambulatory Visit
Admission: RE | Admit: 2022-02-15 | Discharge: 2022-02-15 | Disposition: A | Discharge status: Home / Self Care | Payer: Medicare PPO | Source: Ambulatory Visit | Attending: Student | Admitting: Student

## 2022-02-15 DIAGNOSIS — M7061 Trochanteric bursitis, right hip: Secondary | ICD-10-CM

## 2022-03-03 ENCOUNTER — Other Ambulatory Visit: Payer: Self-pay | Admitting: Orthopedic Surgery

## 2022-03-08 ENCOUNTER — Other Ambulatory Visit: Payer: Self-pay

## 2022-03-08 ENCOUNTER — Encounter
Admission: RE | Admit: 2022-03-08 | Discharge: 2022-03-08 | Disposition: A | Discharge status: Home / Self Care | Payer: Medicare PPO | Source: Ambulatory Visit | Attending: Orthopedic Surgery | Admitting: Orthopedic Surgery

## 2022-03-08 VITALS — BP 132/54 | HR 84 | Resp 16 | Ht 63.0 in | Wt 167.0 lb

## 2022-03-08 DIAGNOSIS — R8290 Unspecified abnormal findings in urine: Secondary | ICD-10-CM | POA: Diagnosis not present

## 2022-03-08 DIAGNOSIS — E119 Type 2 diabetes mellitus without complications: Secondary | ICD-10-CM

## 2022-03-08 DIAGNOSIS — Z01818 Encounter for other preprocedural examination: Secondary | ICD-10-CM | POA: Insufficient documentation

## 2022-03-08 DIAGNOSIS — Z01812 Encounter for preprocedural laboratory examination: Secondary | ICD-10-CM

## 2022-03-08 DIAGNOSIS — Z01810 Encounter for preprocedural cardiovascular examination: Secondary | ICD-10-CM

## 2022-03-08 HISTORY — DX: Depression, unspecified: F32.A

## 2022-03-08 HISTORY — DX: Genetic carrier of other disease: Z14.8

## 2022-03-08 LAB — CBC WITH DIFFERENTIAL/PLATELET
Abs Immature Granulocytes: 0.02 10*3/uL (ref 0.00–0.07)
Basophils Absolute: 0 10*3/uL (ref 0.0–0.1)
Basophils Relative: 0 %
Eosinophils Absolute: 0.5 10*3/uL (ref 0.0–0.5)
Eosinophils Relative: 7 %
HCT: 42 % (ref 36.0–46.0)
Hemoglobin: 14 g/dL (ref 12.0–15.0)
Immature Granulocytes: 0 %
Lymphocytes Relative: 23 %
Lymphs Abs: 1.6 10*3/uL (ref 0.7–4.0)
MCH: 30.5 pg (ref 26.0–34.0)
MCHC: 33.3 g/dL (ref 30.0–36.0)
MCV: 91.5 fL (ref 80.0–100.0)
Monocytes Absolute: 0.5 10*3/uL (ref 0.1–1.0)
Monocytes Relative: 7 %
Neutro Abs: 4.3 10*3/uL (ref 1.7–7.7)
Neutrophils Relative %: 63 %
Platelets: 273 10*3/uL (ref 150–400)
RBC: 4.59 MIL/uL (ref 3.87–5.11)
RDW: 12.3 % (ref 11.5–15.5)
WBC: 6.9 10*3/uL (ref 4.0–10.5)
nRBC: 0 % (ref 0.0–0.2)

## 2022-03-08 LAB — COMPREHENSIVE METABOLIC PANEL
ALT: 13 U/L (ref 0–44)
AST: 17 U/L (ref 15–41)
Albumin: 3.8 g/dL (ref 3.5–5.0)
Alkaline Phosphatase: 59 U/L (ref 38–126)
Anion gap: 8 (ref 5–15)
BUN: 17 mg/dL (ref 8–23)
CO2: 29 mmol/L (ref 22–32)
Calcium: 9.7 mg/dL (ref 8.9–10.3)
Chloride: 105 mmol/L (ref 98–111)
Creatinine, Ser: 1.14 mg/dL — ABNORMAL HIGH (ref 0.44–1.00)
GFR, Estimated: 52 mL/min — ABNORMAL LOW (ref >60)
Glucose, Bld: 116 mg/dL — ABNORMAL HIGH (ref 70–99)
Potassium: 3.3 mmol/L — ABNORMAL LOW (ref 3.5–5.1)
Sodium: 142 mmol/L (ref 135–145)
Total Bilirubin: 0.6 mg/dL (ref 0.3–1.2)
Total Protein: 7 g/dL (ref 6.5–8.1)

## 2022-03-08 LAB — URINALYSIS, ROUTINE W REFLEX MICROSCOPIC
Bilirubin Urine: NEGATIVE
Glucose, UA: NEGATIVE mg/dL
Hgb urine dipstick: NEGATIVE
Ketones, ur: NEGATIVE mg/dL
Nitrite: NEGATIVE
Protein, ur: NEGATIVE mg/dL
Specific Gravity, Urine: 1.019 (ref 1.005–1.030)
pH: 5 (ref 5.0–8.0)

## 2022-03-08 LAB — SURGICAL PCR SCREEN
MRSA, PCR: NEGATIVE
Staphylococcus aureus: NEGATIVE

## 2022-03-08 LAB — TYPE AND SCREEN
ABO/RH(D): A NEG
Antibody Screen: NEGATIVE

## 2022-03-08 NOTE — Patient Instructions (Signed)
Your procedure is scheduled on: 03/18/22 Report to DAY SURGERY DEPARTMENT LOCATED ON 2ND FLOOR MEDICAL MALL ENTRANCE. To find out your arrival time please call [phone] between 1PM - 3PM on 03/17/22.  Remember: Instructions that are not followed completely may result in serious medical risk, up to and including death, or upon the discretion of your surgeon and anesthesiologist your surgery may need to be rescheduled.     _X__ 1. Do not eat food after midnight the night before your procedure.                 No gum chewing or hard candies. You may drink clear liquids up to 2 hours                 before you are scheduled to arrive for your surgery- DO not drink clear                 liquids within 2 hours of the start of your surgery.                 Clear Liquids include:   Diabetics water only  __X__2.  On the morning of surgery brush your teeth with toothpaste and water, you                 may rinse your mouth with mouthwash if you wish.  Do not swallow any              toothpaste of mouthwash.     _X__ 3.  No Alcohol for 24 hours before or after surgery.   _X__ 4.  Do Not Smoke or use e-cigarettes For 24 Hours Prior to Your Surgery.                 Do not use any chewable tobacco products for at least 6 hours prior to                 surgery.  ____  5.  Bring all medications with you on the day of surgery if instructed.   __X__  6.  Notify your doctor if there is any change in your medical condition      (cold, fever, infections).     Do not wear jewelry, make-up, hairpins, clips or nail polish. Do not wear lotions, powders, or perfumes. You may wear deodorant Do not shave body hair 48 hours prior to surgery. Men may shave face and neck. Do not bring valuables to the hospital.    [facility] is not responsible for any belongings or valuables.  Contacts, dentures/partials or body piercings may not be worn into surgery. Bring a case for your contacts, glasses or hearing aids,  a denture cup will be supplied. Leave your suitcase in the car. After surgery it may be brought to your room. For patients admitted to the hospital, discharge time is determined by your treatment team.   Patients discharged the day of surgery will not be allowed to drive home.   Please read over the following fact sheets that you were given:   MRSA Information, CHG soap, Incentive Spirometer  __X__ Take these medicines the morning of surgery with A SIP OF WATER:    1. buPROPion (WELLBUTRIN XL) 300 MG 24 hr tablet   2. DULoxetine (CYMBALTA) 60 MG capsule   3. pantoprazole (PROTONIX) 40 MG tablet   4.  5.  6.  ____ Fleet Enema (as directed)   __X__ Use CHG Soap/SAGE wipes as  directed  ____ Use inhalers on the day of surgery  __X__ Stop metformin/Janumet/Farxiga 2 days prior to surgery  Last dose will be Tuesday morning 03/16/22  ____ Take 1/2 of usual insulin dose the night before surgery. No insulin the morning          of surgery.   ____ Stop Blood Thinners Coumadin/Plavix/Xarelto/Pleta/Pradaxa/Eliquis/Effient/Aspirin  on   Or contact your Surgeon, Cardiologist or Medical Doctor regarding  ability to stop your blood thinners  __X__ Stop Anti-inflammatories 7 days before surgery such as Advil, Ibuprofen, Motrin,  BC or Goodies Powder, Naprosyn, Naproxen, Aleve, Aspirin    __X__ Stop all herbals and supplements, fish oil or vitamins for 7 days until after surgery.    ____ Bring C-Pap to the hospital.   You may continue Tylenol as needed for pain.

## 2022-03-09 ENCOUNTER — Inpatient Hospital Stay: Admission: RE | Admit: 2022-03-09 | Payer: Medicare PPO | Source: Ambulatory Visit

## 2022-03-10 DIAGNOSIS — Z01818 Encounter for other preprocedural examination: Secondary | ICD-10-CM | POA: Diagnosis not present

## 2022-03-10 LAB — URINE CULTURE

## 2022-03-11 LAB — URINE CULTURE
Culture: NO GROWTH
Special Requests: NORMAL

## 2022-03-18 ENCOUNTER — Ambulatory Visit: Payer: Medicare PPO | Admitting: Urgent Care

## 2022-03-18 ENCOUNTER — Observation Stay
Admission: RE | Admit: 2022-03-18 | Discharge: 2022-03-19 | Disposition: A | Discharge status: Home / Self Care | Payer: Medicare PPO | Attending: Orthopedic Surgery | Admitting: Orthopedic Surgery

## 2022-03-18 ENCOUNTER — Encounter
Admission: RE | Disposition: A | Discharge status: Home / Self Care | Payer: Self-pay | Source: Home / Self Care | Attending: Orthopedic Surgery

## 2022-03-18 ENCOUNTER — Other Ambulatory Visit: Payer: Self-pay

## 2022-03-18 ENCOUNTER — Ambulatory Visit: Payer: Medicare PPO

## 2022-03-18 ENCOUNTER — Encounter: Payer: Self-pay | Admitting: Orthopedic Surgery

## 2022-03-18 DIAGNOSIS — Z7984 Long term (current) use of oral hypoglycemic drugs: Secondary | ICD-10-CM | POA: Insufficient documentation

## 2022-03-18 DIAGNOSIS — R8290 Unspecified abnormal findings in urine: Secondary | ICD-10-CM

## 2022-03-18 DIAGNOSIS — N189 Chronic kidney disease, unspecified: Secondary | ICD-10-CM | POA: Diagnosis not present

## 2022-03-18 DIAGNOSIS — Z01812 Encounter for preprocedural laboratory examination: Secondary | ICD-10-CM

## 2022-03-18 DIAGNOSIS — M1611 Unilateral primary osteoarthritis, right hip: Principal | ICD-10-CM | POA: Diagnosis present

## 2022-03-18 DIAGNOSIS — Z79899 Other long term (current) drug therapy: Secondary | ICD-10-CM | POA: Diagnosis not present

## 2022-03-18 DIAGNOSIS — N3946 Mixed incontinence: Secondary | ICD-10-CM

## 2022-03-18 DIAGNOSIS — E119 Type 2 diabetes mellitus without complications: Secondary | ICD-10-CM

## 2022-03-18 DIAGNOSIS — E1122 Type 2 diabetes mellitus with diabetic chronic kidney disease: Secondary | ICD-10-CM | POA: Diagnosis not present

## 2022-03-18 DIAGNOSIS — Z01818 Encounter for other preprocedural examination: Secondary | ICD-10-CM

## 2022-03-18 DIAGNOSIS — M7601 Gluteal tendinitis, right hip: Secondary | ICD-10-CM | POA: Diagnosis not present

## 2022-03-18 DIAGNOSIS — I129 Hypertensive chronic kidney disease with stage 1 through stage 4 chronic kidney disease, or unspecified chronic kidney disease: Secondary | ICD-10-CM | POA: Diagnosis not present

## 2022-03-18 HISTORY — PX: TOTAL HIP ARTHROPLASTY: SHX124

## 2022-03-18 LAB — GLUCOSE, CAPILLARY
Glucose-Capillary: 119 mg/dL — ABNORMAL HIGH (ref 70–99)
Glucose-Capillary: 136 mg/dL — ABNORMAL HIGH (ref 70–99)
Glucose-Capillary: 130 mg/dL — ABNORMAL HIGH (ref 70–99)
Glucose-Capillary: 162 mg/dL — ABNORMAL HIGH (ref 70–99)

## 2022-03-18 SURGERY — ARTHROPLASTY, HIP, TOTAL,POSTERIOR APPROACH
Anesthesia: Spinal | Site: Hip | Laterality: Right

## 2022-03-18 MED ORDER — BUPIVACAINE LIPOSOME 1.3 % IJ SUSP
INTRAMUSCULAR | Status: AC
  Filled 2022-03-18: qty 40

## 2022-03-18 MED ORDER — TRANEXAMIC ACID 1000 MG/10ML IV SOLN
INTRAVENOUS | Status: AC
  Filled 2022-03-18: qty 20

## 2022-03-18 MED ORDER — PROPOFOL 10 MG/ML IV BOLUS
INTRAVENOUS | Status: AC
  Filled 2022-03-18: qty 20

## 2022-03-18 MED ORDER — FENTANYL CITRATE (PF) 100 MCG/2ML IJ SOLN
INTRAMUSCULAR | Status: AC
  Filled 2022-03-18: qty 2

## 2022-03-18 MED ORDER — TRAMADOL HCL 50 MG PO TABS: 50.0000 mg | ORAL_TABLET | Freq: Four times a day (QID) | ORAL | Status: DC | PRN

## 2022-03-18 MED ORDER — BUPIVACAINE HCL (PF) 0.5 % IJ SOLN
INTRAMUSCULAR | Status: DC | PRN
  Administered 2022-03-18: 2.5 mL via INTRATHECAL

## 2022-03-18 MED ORDER — BUPROPION HCL ER (XL) 150 MG PO TB24
300.0000 mg | ORAL_TABLET | Freq: Every day | ORAL | Status: DC
  Administered 2022-03-19: 300 mg via ORAL
  Filled 2022-03-18: qty 2

## 2022-03-18 MED ORDER — DULOXETINE HCL 30 MG PO CPEP
60.0000 mg | ORAL_CAPSULE | Freq: Every day | ORAL | Status: DC
  Administered 2022-03-19: 60 mg via ORAL
  Filled 2022-03-18: qty 2

## 2022-03-18 MED ORDER — ONDANSETRON HCL 4 MG/2ML IJ SOLN
INTRAMUSCULAR | Status: DC | PRN
  Administered 2022-03-18: 4 mg via INTRAVENOUS

## 2022-03-18 MED ORDER — CHLORHEXIDINE GLUCONATE 0.12 % MT SOLN
OROMUCOSAL | Status: AC
  Administered 2022-03-18: 15 mL via OROMUCOSAL
  Filled 2022-03-18: qty 15

## 2022-03-18 MED ORDER — GLYCOPYRROLATE 0.2 MG/ML IJ SOLN
INTRAMUSCULAR | Status: AC
  Filled 2022-03-18: qty 1

## 2022-03-18 MED ORDER — CHLORHEXIDINE GLUCONATE 0.12 % MT SOLN: 15.0000 mL | Freq: Once | OROMUCOSAL | Status: AC

## 2022-03-18 MED ORDER — ONDANSETRON HCL 4 MG PO TABS: 4.0000 mg | ORAL_TABLET | Freq: Four times a day (QID) | ORAL | Status: DC | PRN

## 2022-03-18 MED ORDER — POTASSIUM CHLORIDE ER 8 MEQ PO CPCR
8.0000 meq | ORAL_CAPSULE | Freq: Every day | ORAL | Status: DC
  Administered 2022-03-18 – 2022-03-19 (×2): 8 meq via ORAL
  Filled 2022-03-18 (×2): qty 1

## 2022-03-18 MED ORDER — LISINOPRIL-HYDROCHLOROTHIAZIDE 20-25 MG PO TABS: 0.5000 | ORAL_TABLET | Freq: Every day | ORAL | Status: DC

## 2022-03-18 MED ORDER — SODIUM CHLORIDE 0.9 % IV SOLN: INTRAVENOUS | Status: DC

## 2022-03-18 MED ORDER — ROSUVASTATIN CALCIUM 5 MG PO TABS
5.0000 mg | ORAL_TABLET | Freq: Every day | ORAL | Status: DC
  Administered 2022-03-18: 5 mg via ORAL
  Filled 2022-03-18: qty 1

## 2022-03-18 MED ORDER — PANTOPRAZOLE SODIUM 40 MG PO TBEC
40.0000 mg | DELAYED_RELEASE_TABLET | Freq: Every day | ORAL | Status: DC
  Administered 2022-03-19: 40 mg via ORAL
  Filled 2022-03-18: qty 1

## 2022-03-18 MED ORDER — CEFAZOLIN SODIUM-DEXTROSE 2-4 GM/100ML-% IV SOLN
2.0000 g | INTRAVENOUS | Status: AC
  Administered 2022-03-18: 2 g via INTRAVENOUS

## 2022-03-18 MED ORDER — ENOXAPARIN SODIUM 40 MG/0.4ML IJ SOSY
40.0000 mg | PREFILLED_SYRINGE | INTRAMUSCULAR | Status: DC
  Administered 2022-03-19: 40 mg via SUBCUTANEOUS
  Filled 2022-03-18: qty 0.4

## 2022-03-18 MED ORDER — SODIUM CHLORIDE 0.9 % IR SOLN
Status: DC | PRN
  Administered 2022-03-18: 3000 mL

## 2022-03-18 MED ORDER — MENTHOL 3 MG MT LOZG: 1.0000 | LOZENGE | OROMUCOSAL | Status: DC | PRN

## 2022-03-18 MED ORDER — ORAL CARE MOUTH RINSE: 15.0000 mL | Freq: Once | OROMUCOSAL | Status: AC

## 2022-03-18 MED ORDER — PHENYLEPHRINE HCL-NACL 20-0.9 MG/250ML-% IV SOLN
INTRAVENOUS | Status: DC | PRN
  Administered 2022-03-18: 50 ug/min via INTRAVENOUS

## 2022-03-18 MED ORDER — OXYCODONE HCL 5 MG PO TABS: 5.0000 mg | ORAL_TABLET | Freq: Once | ORAL | Status: DC | PRN

## 2022-03-18 MED ORDER — PROPOFOL 500 MG/50ML IV EMUL
INTRAVENOUS | Status: DC | PRN
  Administered 2022-03-18: 60 ug/kg/min via INTRAVENOUS

## 2022-03-18 MED ORDER — TRAZODONE HCL 50 MG PO TABS
50.0000 mg | ORAL_TABLET | Freq: Every day | ORAL | Status: DC
  Administered 2022-03-18: 50 mg via ORAL
  Filled 2022-03-18: qty 1

## 2022-03-18 MED ORDER — FENTANYL CITRATE (PF) 100 MCG/2ML IJ SOLN
INTRAMUSCULAR | Status: DC | PRN
  Administered 2022-03-18: 50 ug via INTRAVENOUS

## 2022-03-18 MED ORDER — LACTATED RINGERS IV SOLN: INTRAVENOUS | Status: DC

## 2022-03-18 MED ORDER — DOCUSATE SODIUM 100 MG PO CAPS
100.0000 mg | ORAL_CAPSULE | Freq: Two times a day (BID) | ORAL | Status: DC
  Administered 2022-03-18 – 2022-03-19 (×2): 100 mg via ORAL
  Filled 2022-03-18 (×2): qty 1

## 2022-03-18 MED ORDER — HYDROCODONE-ACETAMINOPHEN 5-325 MG PO TABS: 1.0000 | ORAL_TABLET | ORAL | Status: DC | PRN

## 2022-03-18 MED ORDER — MIDAZOLAM HCL 5 MG/5ML IJ SOLN
INTRAMUSCULAR | Status: DC | PRN
  Administered 2022-03-18: 2 mg via INTRAVENOUS

## 2022-03-18 MED ORDER — MORPHINE SULFATE (PF) 2 MG/ML IV SOLN: 0.5000 mg | INTRAVENOUS | Status: DC | PRN

## 2022-03-18 MED ORDER — KETOROLAC TROMETHAMINE 15 MG/ML IJ SOLN
15.0000 mg | Freq: Four times a day (QID) | INTRAMUSCULAR | Status: AC
  Administered 2022-03-18 – 2022-03-19 (×2): 15 mg via INTRAVENOUS
  Filled 2022-03-18 (×2): qty 1

## 2022-03-18 MED ORDER — SODIUM CHLORIDE (PF) 0.9 % IJ SOLN
INTRAMUSCULAR | Status: DC | PRN
  Administered 2022-03-18: 40 mL via INTRAMUSCULAR

## 2022-03-18 MED ORDER — IRRISEPT - 450ML BOTTLE WITH 0.05% CHG IN STERILE WATER, USP 99.95% OPTIME
TOPICAL | Status: DC | PRN
  Administered 2022-03-18: 450 mL

## 2022-03-18 MED ORDER — LISINOPRIL 20 MG PO TABS: 20.0000 mg | ORAL_TABLET | Freq: Every day | ORAL | Status: DC

## 2022-03-18 MED ORDER — ACETAMINOPHEN 10 MG/ML IV SOLN: 1000.0000 mg | Freq: Once | INTRAVENOUS | Status: DC | PRN

## 2022-03-18 MED ORDER — METOCLOPRAMIDE HCL 5 MG PO TABS: 5.0000 mg | ORAL_TABLET | Freq: Three times a day (TID) | ORAL | Status: DC | PRN

## 2022-03-18 MED ORDER — TRANEXAMIC ACID-NACL 1000-0.7 MG/100ML-% IV SOLN
INTRAVENOUS | Status: AC
  Filled 2022-03-18: qty 100

## 2022-03-18 MED ORDER — MIDAZOLAM HCL 2 MG/2ML IJ SOLN
INTRAMUSCULAR | Status: AC
  Filled 2022-03-18: qty 2

## 2022-03-18 MED ORDER — 0.9 % SODIUM CHLORIDE (POUR BTL) OPTIME
TOPICAL | Status: DC | PRN
  Administered 2022-03-18: 500 mL

## 2022-03-18 MED ORDER — INSULIN ASPART 100 UNIT/ML IJ SOLN
0.0000 [IU] | Freq: Three times a day (TID) | INTRAMUSCULAR | Status: DC
  Administered 2022-03-18 – 2022-03-19 (×2): 2 [IU] via SUBCUTANEOUS
  Filled 2022-03-18 (×2): qty 1

## 2022-03-18 MED ORDER — INSULIN ASPART 100 UNIT/ML IJ SOLN: 0.0000 [IU] | Freq: Every day | INTRAMUSCULAR | Status: DC

## 2022-03-18 MED ORDER — CEFAZOLIN SODIUM-DEXTROSE 2-4 GM/100ML-% IV SOLN
INTRAVENOUS | Status: AC
  Filled 2022-03-18: qty 100

## 2022-03-18 MED ORDER — GLYCOPYRROLATE 0.2 MG/ML IJ SOLN
INTRAMUSCULAR | Status: DC | PRN
  Administered 2022-03-18: .2 mg via INTRAVENOUS

## 2022-03-18 MED ORDER — METOCLOPRAMIDE HCL 5 MG/ML IJ SOLN: 5.0000 mg | Freq: Three times a day (TID) | INTRAMUSCULAR | Status: DC | PRN

## 2022-03-18 MED ORDER — FENTANYL CITRATE (PF) 100 MCG/2ML IJ SOLN: 25.0000 ug | INTRAMUSCULAR | Status: DC | PRN

## 2022-03-18 MED ORDER — DEXAMETHASONE SODIUM PHOSPHATE 10 MG/ML IJ SOLN
INTRAMUSCULAR | Status: AC
  Filled 2022-03-18: qty 1

## 2022-03-18 MED ORDER — DEXAMETHASONE SODIUM PHOSPHATE 10 MG/ML IJ SOLN
8.0000 mg | Freq: Once | INTRAMUSCULAR | Status: AC
  Administered 2022-03-18: 8 mg via INTRAVENOUS

## 2022-03-18 MED ORDER — HYDROCHLOROTHIAZIDE 25 MG PO TABS: 25.0000 mg | ORAL_TABLET | Freq: Every day | ORAL | Status: DC

## 2022-03-18 MED ORDER — CEFAZOLIN SODIUM-DEXTROSE 2-4 GM/100ML-% IV SOLN
2.0000 g | Freq: Four times a day (QID) | INTRAVENOUS | Status: AC
  Administered 2022-03-18 (×2): 2 g via INTRAVENOUS
  Filled 2022-03-18 (×2): qty 100

## 2022-03-18 MED ORDER — PHENOL 1.4 % MT LIQD: 1.0000 | OROMUCOSAL | Status: DC | PRN

## 2022-03-18 MED ORDER — ONDANSETRON HCL 4 MG/2ML IJ SOLN: 4.0000 mg | Freq: Four times a day (QID) | INTRAMUSCULAR | Status: DC | PRN

## 2022-03-18 MED ORDER — TRANEXAMIC ACID-NACL 1000-0.7 MG/100ML-% IV SOLN
1000.0000 mg | INTRAVENOUS | Status: AC
  Administered 2022-03-18 (×2): 1000 mg via INTRAVENOUS

## 2022-03-18 MED ORDER — ACETAMINOPHEN 500 MG PO TABS
1000.0000 mg | ORAL_TABLET | Freq: Three times a day (TID) | ORAL | Status: DC
  Administered 2022-03-18 – 2022-03-19 (×3): 1000 mg via ORAL
  Filled 2022-03-18 (×3): qty 2

## 2022-03-18 MED ORDER — EPHEDRINE SULFATE (PRESSORS) 50 MG/ML IJ SOLN
INTRAMUSCULAR | Status: DC | PRN
  Administered 2022-03-18: 15 mg via INTRAVENOUS

## 2022-03-18 MED ORDER — OXYCODONE HCL 5 MG/5ML PO SOLN: 5.0000 mg | Freq: Once | ORAL | Status: DC | PRN

## 2022-03-18 MED ORDER — ONDANSETRON HCL 4 MG/2ML IJ SOLN: 4.0000 mg | Freq: Once | INTRAMUSCULAR | Status: DC | PRN

## 2022-03-18 MED ORDER — PHENYLEPHRINE HCL-NACL 20-0.9 MG/250ML-% IV SOLN
INTRAVENOUS | Status: AC
  Filled 2022-03-18: qty 250

## 2022-03-18 MED ORDER — SODIUM CHLORIDE FLUSH 0.9 % IV SOLN
INTRAVENOUS | Status: AC
  Filled 2022-03-18: qty 20

## 2022-03-18 MED ORDER — BUPIVACAINE-EPINEPHRINE (PF) 0.25% -1:200000 IJ SOLN
INTRAMUSCULAR | Status: AC
  Filled 2022-03-18: qty 60

## 2022-03-18 SURGICAL SUPPLY — 75 items
BLADE SAGITTAL AGGR TOOTH XLG (BLADE) ×1 IMPLANT
BNDG COHESIVE 6X5 TAN ST LF (GAUZE/BANDAGES/DRESSINGS) ×1 IMPLANT
CHLORAPREP W/TINT 26 (MISCELLANEOUS) ×2 IMPLANT
COVER BACK TABLE REUSABLE LG (DRAPES) ×1 IMPLANT
COVER SET STULBERG POSITIONER (MISCELLANEOUS) ×1 IMPLANT
DERMABOND ADVANCED .7 DNX12 (GAUZE/BANDAGES/DRESSINGS) ×1 IMPLANT
DRAPE 3/4 80X56 (DRAPES) ×1 IMPLANT
DRAPE IMP U-DRAPE 54X76 (DRAPES) ×1 IMPLANT
DRAPE INCISE IOBAN 66X60 STRL (DRAPES) ×1 IMPLANT
DRAPE POUCH INSTRU U-SHP 10X18 (DRAPES) ×1 IMPLANT
DRAPE U-SHAPE 47X51 STRL (DRAPES) ×1 IMPLANT
DRSG MEPILEX SACRM 8.7X9.8 (GAUZE/BANDAGES/DRESSINGS) ×1 IMPLANT
DRSG OPSITE POSTOP 4X10 (GAUZE/BANDAGES/DRESSINGS) IMPLANT
DRSG OPSITE POSTOP 4X12 (GAUZE/BANDAGES/DRESSINGS) IMPLANT
DRSG OPSITE POSTOP 4X8 (GAUZE/BANDAGES/DRESSINGS) IMPLANT
ELECT REM PT RETURN 9FT ADLT (ELECTROSURGICAL) ×1
ELECTRODE REM PT RTRN 9FT ADLT (ELECTROSURGICAL) ×1 IMPLANT
GLOVE BIO SURGEON STRL SZ8 (GLOVE) ×1 IMPLANT
GLOVE BIOGEL PI IND STRL 8 (GLOVE) ×1 IMPLANT
GLOVE PI ORTHO PRO STRL 7.5 (GLOVE) ×2 IMPLANT
GLOVE PI ORTHO PRO STRL SZ8 (GLOVE) ×2 IMPLANT
GLOVE SURG SYN 7.5  E (GLOVE) ×2
GLOVE SURG SYN 7.5 E (GLOVE) ×2 IMPLANT
GLOVE SURG SYN 7.5 PF PI (GLOVE) ×2 IMPLANT
GOWN STRL REUS W/ TWL LRG LVL3 (GOWN DISPOSABLE) ×1 IMPLANT
GOWN STRL REUS W/ TWL XL LVL3 (GOWN DISPOSABLE) ×2 IMPLANT
GOWN STRL REUS W/TWL LRG LVL3 (GOWN DISPOSABLE) ×1
GOWN STRL REUS W/TWL XL LVL3 (GOWN DISPOSABLE) ×2
HANDLE YANKAUER SUCT OPEN TIP (MISCELLANEOUS) ×1 IMPLANT
HEAD HIP LFIT V40 22 (Head) IMPLANT
HIP HD LFIT V40 22 (Head) ×1 IMPLANT
HOLDER FOLEY CATH W/STRAP (MISCELLANEOUS) ×1 IMPLANT
HOOD PEEL AWAY T7 (MISCELLANEOUS) ×2 IMPLANT
INSERT MDM LINER 22.2X38 38D (Insert) IMPLANT
IV NS 100ML SINGLE PACK (IV SOLUTION) IMPLANT
IV NS IRRIG 3000ML ARTHROMATIC (IV SOLUTION) ×1 IMPLANT
JET LAVAGE IRRISEPT WOUND (IRRIGATION / IRRIGATOR) ×1
KIT TURNOVER KIT A (KITS) ×1 IMPLANT
LAVAGE JET IRRISEPT WOUND (IRRIGATION / IRRIGATOR) IMPLANT
LINER CEMENTLESS SZ D 38 HIP (Liner) IMPLANT
MANIFOLD NEPTUNE II (INSTRUMENTS) ×1 IMPLANT
MARKER SKIN DUAL TIP RULER LAB (MISCELLANEOUS) IMPLANT
MAT ABSORB  FLUID 56X50 GRAY (MISCELLANEOUS) ×1
MAT ABSORB FLUID 56X50 GRAY (MISCELLANEOUS) ×1 IMPLANT
NDL SPNL 20GX3.5 QUINCKE YW (NEEDLE) ×1 IMPLANT
NEEDLE SPNL 20GX3.5 QUINCKE YW (NEEDLE) ×1 IMPLANT
NS IRRIG 500ML POUR BTL (IV SOLUTION) IMPLANT
PACK HIP PROSTHESIS (MISCELLANEOUS) ×1 IMPLANT
PENCIL SMOKE EVACUATOR (MISCELLANEOUS) ×1 IMPLANT
PILLOW ABDUCTION FOAM SM (MISCELLANEOUS) ×1 IMPLANT
PULSAVAC PLUS IRRIG FAN TIP (DISPOSABLE) ×1
RETRIEVER SUT HEWSON (MISCELLANEOUS) ×1 IMPLANT
SCREW HEX LP 6.5X40 (Screw) IMPLANT
SHELL TRIDENT II CLUST 50 (Shell) IMPLANT
SLEEVE SCD COMPRESS KNEE MED (STOCKING) ×1 IMPLANT
SOLUTION IRRIG SURGIPHOR (IV SOLUTION) ×1 IMPLANT
STEM HIGH OFFSET SZ4 36X103 (Stem) IMPLANT
SUT BONE WAX W31G (SUTURE) ×1 IMPLANT
SUT DVC 2 QUILL PDO  T11 36X36 (SUTURE) ×1
SUT DVC 2 QUILL PDO T11 36X36 (SUTURE) ×1 IMPLANT
SUT ETHIBOND #5 BRAIDED 30INL (SUTURE) ×1 IMPLANT
SUT QUILL MONODERM 3-0 PS-2 (SUTURE) ×1 IMPLANT
SUT VIC AB 0 CT1 36 (SUTURE) ×1 IMPLANT
SUT VIC AB 2-0 CT2 27 (SUTURE) ×2 IMPLANT
SUT VICRYL 1-0 27IN ABS (SUTURE) ×1
SUTURE VICRYL 1-0 27IN ABS (SUTURE) ×1 IMPLANT
SYR 10ML LL (SYRINGE) ×1 IMPLANT
SYR 30ML LL (SYRINGE) ×2 IMPLANT
TIP FAN IRRIG PULSAVAC PLUS (DISPOSABLE) ×1 IMPLANT
TOWEL OR 17X26 4PK STRL BLUE (TOWEL DISPOSABLE) IMPLANT
TRAP FLUID SMOKE EVACUATOR (MISCELLANEOUS) ×1 IMPLANT
TRAY FOLEY SLVR 16FR LF STAT (SET/KITS/TRAYS/PACK) ×1 IMPLANT
TUBE KAMVAC SUCTION (TUBING) ×1 IMPLANT
WAND WEREWOLF FASTSEAL 6.0 (MISCELLANEOUS) ×1 IMPLANT
WATER STERILE IRR 1000ML POUR (IV SOLUTION) ×1 IMPLANT

## 2022-03-18 NOTE — Op Note (Signed)
Patient Name: Sheryl [name]  [id number]  Pre-Operative Diagnosis: Right hip Osteoarthritis  Post-Operative Diagnosis: (same)  Procedure: Right Total Hip Arthroplasty  Components/Implants: Cup 65mm w/1 screw Trident II clusterhole    Liner MDM 42mm/D  Stem Insignia #4 high offset  Head MDM38/22.2 w/ 22.2 +40mm LFIT V40 head  Date of Surgery: 03/18/2022  Surgeon: Steffanie Rainwater MD  Assistant: Dorise Hiss PA (present and scrubbed throughout the case, critical for assistance with exposure, retraction, instrumentation, and closure)   Anesthesiologist: Mazzoni  Anesthesia: Spinal  [id number]  EBL: [id number]  Complications: None   Brief history: The patient is a 71 year old female with a history of osteoarthritis of the right hip with pain limiting their range of motion and activities of daily living, which has failed multiple attempts at conservative therapy.  The risks and benefits of total hip arthroplasty as definitive surgical treatment were discussed with the patient, who opted to proceed with the operation.  After outpatient medical clearance and optimization was completed the patient was admitted to [facility] for the procedure.  All preoperative films were reviewed and an appropriate surgical plan was made prior to surgery.   Description of procedure: The patient was brought to the operating room where laterality was confirmed by all those present to be the right  side.  The patient was moved to the table and administered spinal anesthesia. Patient was given an intravenous dose of antibiotics for surgical prophylaxis and TXA. The patient was positioned in lateral decubitus position with all bony prominences well-padded.  Surgical site was prepped with alcohol and chlorhexidine.  Surgical site over the hip was draped in typical sterile fashion with multiple layers of adhesive and nonadhesive drapes.  The incision site over the greater trochanter posteriorly was  marked out with a sterile marker.   Surgical timeout was then called with participation of all staff in the room the patient was confirmed and laterality again confirmed.  An incision was made over the lateral aspect of the hip cheating posteriorly on the proximal aspect.  Careful soft tissue dissection and coagulation of all bleeders was carried out down to the level of the glut max fascia.  The fascia was carefully incised in line with the femur.  A Charnley retractor was placed deep to the fascia with care taken to ensure that there was no nerve entrapment in the retractor.  The bursa tissue was taken down over the posterior femur exposing the external rotators.  A dull Cobra retractor was placed under the abductor mechanism to protect the mechanism and fully expose the piriformis and short external rotators.  The external rotators were carefully detached from the femur with electrocautery and tagged with Ethibond sutures.  The capsule to the hip was incised and tagged with sutures.  The femur was then carefully dislocated and cobra retractors were placed superior and inferior to the femoral neck.  The x-ray template was reviewed and electrocautery was used to mark out the femoral neck resection.   After the femoral neck and head were resected attention was then turned to the acetabulum.  An anterior acetabular retractor was placed and posterior retractor was placed to fully visualize the acetabulum.  The labrum was removed with electrocautery and the pulnivar was removed.  The acetabulum was then reamed sequentially up to a size 80mm cup which allowed for good bony coverage and stability.  The size 43mm acetabular component was then opened and implanted.  A drill was used to carefully place the 1 screw  into the acetabular component.  The cup was then irrigated and a real MDM liner was placed and impacted.   Attention was turned back to the femur and a femoral neck retractor was placed.  The femur was  opened with a box osteotome and canal finder.  The femur was then sequentially broached up to a size 4 broach which allowed for good fit and fill.  A calcar planer was then used to smooth out the calcar.  A trial neck and head were then attached and the hip was reduced.  The hip was found to be stable on reduction with full range of motion without subluxation or dislocation and leg lengths felt equal.  The hip was then carefully dislocated head and neck trial was removed.    The femur was then exposed the broach was removed the canal was irrigated and a real size 4 highoffset femoral implant was impacted into place.  The real ball was then placed on the femoral component.  The acetabulum was irrigated and the hip was reduced.  The hip showed good range of motion and stability on testing with both stability in flexion internal rotation and extension external rotation.  The hip was then irrigated with irrisept and pulsatile lavage.  A 2 mm drill bit was then used to make 2 holes in the greater trochanter the piriformis and capsular tissues were reapproximated and passed through the drill holes and tied over the greater trochanter.  The fascia was then approximated with #1 Vicryl and #2 barbed suture.  The subcutaneous tissues and skin were closed with 0 Vicryl 2-0 Vicryl and 3-0 monoderm quil uture and the skin closed with Dermabond.  A sterile dressing was then applied.  Lap, sharps, and sponge counts were correct at the end of the case.   The patient was then rolled supine and an x-ray was taken in the operating room. Leg lengths were clinically symmetric on examination with a good distal pulse. Components appeared in good position with no fractures noted on x-ray.  Patient was then transferred to a hospital bed and transferred to the recovery room in stable condition.

## 2022-03-18 NOTE — Evaluation (Signed)
Physical Therapy Evaluation Patient Details Name: Sheryl [name] MRN: [id number] DOB: 1950/10/31 Today's Date: 03/18/2022  History of Present Illness  Sheryl [name] is a 81yoF who comes to [facility] on 03/18/22 for elective Rt hip THA, posterior approach, WBAT. PT pt was independent in ADL performance, AMBhousehold to limited community distances with intermittent DME, generally resistance to use of equipment unless otherwise needed. Pt lives at home with her grandDTR and other family who plan to assist at DC.  Clinical Impression  Pt awake in bed on entry, full return of motor and sensory function, pain well controlled mostly at incision while in bed and later in trochanteric area post mobility. Pt needs help with precautions adherence more than anything else today. No physical assistance is needed for mobility performance, no signs of PONV. Pt able to rise to standing with good tolerance to loading and AMB to doorway/hallway, turn around and beeline to the toilet where attempts to void are met with despair. Pt performs hand hygiene at sink, then return to recliner. All needs met. Family at bedside. Pt desires DC to home which is appropriate at this time from a PT standpoint.       Recommendations for follow up therapy are one component of a multi-disciplinary discharge planning process, led by the attending physician.  Recommendations may be updated based on patient status, additional functional criteria and insurance authorization.  Follow Up Recommendations Home health PT      Assistance Recommended at Discharge Set up Supervision/Assistance  Patient can return home with the following  Assist for transportation;Assistance with cooking/housework;Help with stairs or ramp for entrance    Equipment Recommendations None recommended by PT  Recommendations for Other Services       Functional Status Assessment Patient has had a recent decline in their functional status and demonstrates the ability to  make significant improvements in function in a reasonable and predictable amount of time.     Precautions / Restrictions Precautions Precautions: Posterior Hip Precaution Booklet Issued: Yes (comment) Precaution Comments: pt is habitual leg crosser Restrictions Weight Bearing Restrictions: Yes RLE Weight Bearing: Weight bearing as tolerated      Mobility  Bed Mobility Overal bed mobility: Needs Assistance Bed Mobility: Supine to Sit     Supine to sit: Min guard     General bed mobility comments: generally low impairment from pain or weakness, author holds the reins to assure step-by-step adherence to hip precautions.    Transfers Overall transfer level: Needs assistance Equipment used: Rolling walker (2 wheels) Transfers: Sit to/from Stand Sit to Stand: Supervision           General transfer comment: Pryor Curia holds the reins to assure step-by-step adherence to hip precautions.    Ambulation/Gait Ambulation/Gait assistance: Min guard Gait Distance (Feet): 40 Feet Assistive device: Rolling walker (2 wheels) Gait Pattern/deviations: Step-to pattern       General Gait Details: good posture, safety awareness, no cues needed to avoid RLE IR in gait or turning. No adverse symptoms while up.  Stairs            Wheelchair Mobility    Modified Rankin (Stroke Patients Only)       Balance                                             Pertinent Vitals/Pain Pain Assessment Pain Assessment: 0-10 Pain Score:  1  Pain Location: incision and/or trochanteric area Pain Descriptors / Indicators: Aching, Operative site guarding Pain Intervention(s): Limited activity within patient's tolerance, Monitored during session, Premedicated before session, Repositioned    Home Living Family/patient expects to be discharged to:: Private residence Living Arrangements: Other relatives ([place]; [place] grandDTR's SO who works FT.) Available Help at  Discharge: Family Type of Home: House Home Access: Stairs to enter Entrance Stairs-Rails: Right;Left;Can reach both Technical brewer of Steps: 3   Home Layout: One level Home Equipment: Wheelchair - Publishing copy (2 wheels);Cane - single point;Shower seat      Prior Function Prior Level of Function : Independent/Modified Independent             Mobility Comments: intermittent use of SPC and RW       Hand Dominance   Dominant Hand: Right    Extremity/Trunk Assessment                Communication      Cognition Arousal/Alertness: Awake/alert Behavior During Therapy: WFL for tasks assessed/performed Overall Cognitive Status: Within Functional Limits for tasks assessed                                          General Comments      Exercises Total Joint Exercises Ankle Circles/Pumps: AROM, Both, Supine Heel Slides: AAROM, Right, 10 reps, Supine, Limitations Heel Slides Limitations: requires really minimal to no assist physically Hip ABduction/ADduction: AAROM, Right, 10 reps, Supine, Limitations Hip Abduction/Adduction Limitations: requires really minimal to no assist physically   Assessment/Plan    PT Assessment Patient needs continued PT services  PT Problem List Decreased range of motion;Decreased activity tolerance;Decreased balance;Decreased mobility;Decreased knowledge of precautions;Decreased knowledge of use of DME       PT Treatment Interventions DME instruction;Balance training;Gait training;Stair training;Functional mobility training;Therapeutic activities;Therapeutic exercise;Patient/family education    PT Goals (Current goals can be found in the Care Plan section)  Acute Rehab PT Goals Patient Stated Goal: return to home and regain independence with mboility PT Goal Formulation: With patient Time For Goal Achievement: 04/01/22 Potential to Achieve Goals: Good    Frequency BID     Co-evaluation                AM-PAC PT "6 Clicks" Mobility  Outcome Measure Help needed turning from your back to your side while in a flat bed without using bedrails?: A Little Help needed moving from lying on your back to sitting on the side of a flat bed without using bedrails?: A Little Help needed moving to and from a bed to a chair (including a wheelchair)?: A Little Help needed standing up from a chair using your arms (e.g., wheelchair or bedside chair)?: A Little Help needed to walk in hospital room?: A Little Help needed climbing 3-5 steps with a railing? : A Little 6 Click Score: 18    End of Session Equipment Utilized During Treatment: Gait belt Activity Tolerance: Patient tolerated treatment well;No increased pain Patient left: in bed;with call bell/phone within reach;with family/visitor present Nurse Communication: Mobility status PT Visit Diagnosis: Difficulty in walking, not elsewhere classified (R26.2);Other abnormalities of gait and mobility (R26.89)    Time: 2297-9892 PT Time Calculation (min) (ACUTE ONLY): 45 min   Charges:   PT Evaluation $PT Eval Low Complexity: 1 Low PT Treatments $Gait Training: 8-22 mins $Therapeutic Activity: 8-22 mins  4:03 PM, 03/18/22 Etta Grandchild, PT, DPT Physical Therapist - [facility]  [phone] ([place])    [place] C 03/18/2022, 3:58 PM

## 2022-03-18 NOTE — Interval H&P Note (Signed)
History and Physical Interval Note:  03/18/2022 7:19 AM  Sheryl [name]  has presented today for surgery, with the diagnosis of Osteoarthritis of right hip, unspecified osteoarthritis type M16.11 Right hip pain M25.551.  The various methods of treatment have been discussed with the patient and family. After consideration of risks, benefits and other options for treatment, the patient has consented to  Procedure(s): Right posterior total hip arthroplasty (Right) as a surgical intervention.  The patient's history has been reviewed, patient examined, no change in status, stable for surgery.  I have reviewed the patient's chart and labs.  Questions were answered to the patient's satisfaction.     Reinaldo Berber

## 2022-03-18 NOTE — Anesthesia Preprocedure Evaluation (Addendum)
Anesthesia Evaluation  Patient identified by MRN, date of birth, ID band Patient awake    Reviewed: Allergy & Precautions, NPO status , Patient's Chart, lab work & pertinent test results  History of Anesthesia Complications (+) PONV and history of anesthetic complications  Airway Mallampati: IV   Neck ROM: Full    Dental  (+) Missing   Pulmonary former smoker (quit 2010)   Pulmonary exam normal breath sounds clear to auscultation       Cardiovascular hypertension, Normal cardiovascular exam Rhythm:Regular Rate:Normal  ECG 03/08/22: normal  Echo 04/23/19:  NORMAL LEFT VENTRICULAR SYSTOLIC FUNCTION  NORMAL RIGHT VENTRICULAR SYSTOLIC FUNCTION  NO VALVULAR REGURGITATION  NO VALVULAR STENOSIS  EF 55%  Closest EF: >55% (Estimated)    Neuro/Psych  Headaches PSYCHIATRIC DISORDERS Anxiety        GI/Hepatic hiatal hernia,GERD  ,,  Endo/Other  diabetes, Type 2    Renal/GU Renal disease (CKD, nephrolithiasis)     Musculoskeletal   Abdominal   Peds  Hematology negative hematology ROS (+)   Anesthesia Other Findings   Reproductive/Obstetrics                             Anesthesia Physical Anesthesia Plan  ASA: 2  Anesthesia Plan: General and Spinal   Post-op Pain Management:    Induction: Intravenous  PONV Risk Score and Plan: 4 or greater and Propofol infusion, TIVA, Treatment may vary due to age or medical condition, Ondansetron and Midazolam  Airway Management Planned: Natural Airway and Nasal Cannula  Additional Equipment:   Intra-op Plan:   Post-operative Plan:   Informed Consent: I have reviewed the patients History and Physical, chart, labs and discussed the procedure including the risks, benefits and alternatives for the proposed anesthesia with the patient or authorized representative who has indicated his/her understanding and acceptance.       Plan Discussed with:  CRNA  Anesthesia Plan Comments: (Plan for spinal and GA with natural airway, LMA/GETA backup.  Patient consented for risks of anesthesia including but not limited to:  - adverse reactions to medications - damage to eyes, teeth, lips or other oral mucosa - nerve damage due to positioning  - sore throat or hoarseness - headache, bleeding, infection, nerve damage 2/2 spinal - damage to heart, brain, nerves, lungs, other parts of body or loss of life  Informed patient about role of CRNA in peri- and intra-operative care.  Patient voiced understanding.)        Anesthesia Quick Evaluation

## 2022-03-18 NOTE — Anesthesia Postprocedure Evaluation (Signed)
Anesthesia Post Note  Patient: Sheryl [name]  Procedure(s) Performed: Right posterior total hip arthroplasty (Right: Hip)  Patient location during evaluation: PACU Anesthesia Type: Spinal Level of consciousness: awake and alert, oriented and patient cooperative Pain management: pain level controlled Vital Signs Assessment: post-procedure vital signs reviewed and stable Respiratory status: spontaneous breathing, nonlabored ventilation and respiratory function stable Cardiovascular status: blood pressure returned to baseline and stable Postop Assessment: adequate PO intake, no headache and spinal receding Anesthetic complications: no   No notable events documented.   Last Vitals:  Vitals:   03/18/22 1025 03/18/22 1030  BP:  (!) 108/59  Pulse: 80 81  Resp: 15 14  Temp:  (!) 36.2 C  SpO2: 97% 90%    Last Pain:  Vitals:   03/18/22 1030  TempSrc:   PainSc: 0-No pain                 Reed Breech

## 2022-03-18 NOTE — Plan of Care (Signed)
  Problem: Nutritional: Goal: Maintenance of adequate nutrition will improve Outcome: Progressing   Problem: Skin Integrity: Goal: Risk for impaired skin integrity will decrease Outcome: Progressing   Problem: Pain Management: Goal: Pain level will decrease with appropriate interventions Outcome: Progressing   Problem: Activity: Goal: Risk for activity intolerance will decrease Outcome: Progressing

## 2022-03-18 NOTE — Transfer of Care (Signed)
Immediate Anesthesia Transfer of Care Note  Patient: Sheryl [name]  Procedure(s) Performed: Right posterior total hip arthroplasty (Right: Hip)  Patient Location: PACU  Anesthesia Type:Spinal  Level of Consciousness: awake and alert   Airway & Oxygen Therapy: Patient Spontanous Breathing  Post-op Assessment: Report given to RN and Post -op Vital signs reviewed and stable  Post vital signs: Reviewed  Last Vitals:  Vitals Value Taken Time  BP    Temp    Pulse    Resp    SpO2      Last Pain:  Vitals:   03/18/22 0633  TempSrc: Tympanic  PainSc: 0-No pain         Complications: No notable events documented.

## 2022-03-18 NOTE — H&P (Signed)
History of Present Illness: Sheryl [name] is an 72 y.o. female who presents for evaluation of her right hip which began approximately 6 months ago with no injury.  The patient was seen by Dr. Rudene Christians and diagnosed with greater trochanter bursitis and underwent a trochanter injection on 01/19/2022.  She replaced she had no improvement in pain from that injection.  She had an MRI performed on 02/15/2022 which showed moderate arthritis in the right hip and some gluteal tendinopathy but no tear and no evidence of trochanteric bursitis.  Patient was reports she had an increase in pain in her hip for the past 3 months after a fall in August 2023 when she landed on her right side.  She reports stiffness when beginning to walk and and now constant sharp pain located posterior laterally over her right hip.  She also reports pain in her right groin intermittently with certain motions and walking.  She reports at its worst the pain is a 9/10 today is a 8/10.  Reports the pain improves with rest and Tylenol arthritis.  She has been using Voltaren gel as well with minimal relief.  The patient has done home exercises without relief of her pain.  She is unable to take anti-inflammatories due to history of esophagitis.  The pain in her hip has begun to become a functional disability for the patient preventing her from completing activities of daily living.  The patient denies fevers, chills, numbness, tingling, shortness of breath, chest pain, recent illness, or any other trauma.   BMI is 29 diabetic with last A1c of 6.0 she is a non-smoker   Past Medical History:     Past Medical History:  Diagnosis Date   Anxiety     Arthritis     Diabetes mellitus type 2, uncomplicated (CMS-HCC)     Hemochromatosis carrier      elevated iron   Hemochromatosis carrier      History of elevated iron/hemochromatosis carrier.   History of chickenpox     History of dysphagia     Hydronephrosis, left, unspecified, unspecified       s/p lithotripsy   Hypercholesterolemia     Hypertension     Migraines     Nephrolithiasis      Multiple stones.    Situational depression 08/07/2021      Past Surgical History:      Past Surgical History:  Procedure Laterality Date   COLONOSCOPY   05/11/2005    Hyperplastic Polyps    COLONOSCOPY   07/15/2010    Int Hemorrhoids: CBF 07/2020   EGD   03/11/2011    No repeat per RTE   EGD   01/29/2013    No repeat per RTE   EGD   01/31/2015    No repeat per RTE   EGD   09/02/2016    Dilated; Follow up as needed per RTE (dw)   Limited arthroscopic debridement, arthroscopic subacromial decompression, mini-open rotator cuff repair, and mini-open biceps tenodesis, right shoulder. Right 01/19/2017    Dr. Roland Rack   PERCUTANEOUS INSERTION NEPHROSTOMY TUBE Left 05/22/2018    Lt PCNL w/ antegrade double-J uretal stent 2/2 Left partial staghorn calculus, 3.2 cm   CHOLECYSTECTOMY       Esophagus dilated        LITHOTRIPSY          Past Family History: Family History       Family History  Problem Relation Age of Onset   Diabetes Father  High blood pressure (Hypertension) Father     Heart disease Father     Diabetes type II Father     No Known Problems Son     Dementia Mother     Alzheimer's disease Mother     Coronary Artery Disease (Blocked arteries around heart) Brother     Stroke Paternal Grandfather         Medications:       Current Outpatient Medications Ordered in Epic  Medication Sig Dispense Refill   alcohol swabs PadM Apply 1 each topically once daily And as needed Before using lancet to prick finger (Patient taking differently: Check blood glucose once daily) 200 each 1   blood glucose diagnostic test strip 1 each (1 strip total) once daily And as needed (Patient taking differently: 1 strip once daily) 100 each 12   blood glucose meter kit as directed (Patient taking differently: once daily) 1 each 0   buPROPion (WELLBUTRIN XL) 300 MG XL tablet Take 1 tablet (300 mg  total) by mouth once daily 90 tablet 3   cholecalciferol (VITAMIN D3) 2,000 unit tablet Take 3 capsules daily for 3 months, then reduce to 1 capsule daily thereafter for Vitamin D Deficiency. (Patient taking differently: Take 2,000 Units by mouth once daily) 360 tablet 11   cyanocobalamin (VITAMIN B12) 1000 MCG tablet Take 1,000 mcg by mouth once daily       DULoxetine (CYMBALTA) 60 MG DR capsule Take 1 capsule (60 mg total) by mouth once daily 90 capsule 3   famotidine (PEPCID) 10 MG tablet Take by mouth 2 (two) times daily       lancets Use 1 each once daily And as needed (Patient taking differently: Use 1 each once daily) 150 each 12   lisinopriL-hydroCHLOROthiazide (ZESTORETIC) 20-25 mg tablet Take 0.5 tablets by mouth once daily 45 tablet 3   meloxicam (MOBIC) 15 MG tablet Take 1 tablet (15 mg total) by mouth once daily for 30 days 30 tablet 1   metFORMIN (GLUCOPHAGE) 850 MG tablet TAKE 1 TABLET BY MOUTH 2 TIMES DAILY WITH MEALS. 180 tablet 1   pantoprazole (PROTONIX) 40 MG DR tablet Take 1 tablet (40 mg total) by mouth once daily 90 tablet 3   potassium chloride (KLOR-CON) 8 MEQ ER tablet Take 1 tablet (8 mEq total) by mouth once daily 90 tablet 3   propranoloL (INDERAL LA) 60 MG LA capsule Take 1 capsule (60 mg total) by mouth once daily 30 capsule 11   rosuvastatin (CRESTOR) 5 MG tablet Take 1 tablet (5 mg total) by mouth once daily 90 tablet 1   tiZANidine (ZANAFLEX) 2 MG tablet TAKE 1 TABLET BY MOUTH NIGHTLY AS NEEDED. 30 tablet 1   traZODone (DESYREL) 50 MG tablet Take 1 tablet (50 mg total) by mouth at bedtime 90 tablet 3    No current Epic-ordered facility-administered medications on file.      Allergies: No Known Allergies    Review of Systems:  A comprehensive 14 point ROS was performed, reviewed, and the pertinent orthopaedic findings are documented in the HPI.   Physical Exam: There is no height or weight on file to calculate BMI. General/Constitutional: No apparent  distress: well-nourished and well developed. Lymphatic: No palpable adenopathy.   Pulmonary exam: Lungs clear to auscultation bilaterally no wheezing rales or rhonchi Cardiac exam: Regular rate and rhythm no obvious murmurs rubs or gallops.    Vascular: No edema, swelling or tenderness, except as noted in detailed exam. Integumentary: No  impressive skin lesions present, except as noted in detailed exam. Neuro/Psych: Normal mood and affect, oriented to person, place and time. Musculoskeletal: Normal, except as noted in detailed exam and in HPI.   Right hip exam   SKIN: intact SWELLING: none WARMTH: no warmth TENDERNESS: Tender to palpation posterior lateral hip reproduces some percentage of the pain, Stinchfield Positive ROM: 10 degrees internal rotation and 30 degrees external rotation and pain with internal rotation localized posterior laterally STRENGTH: normal GAIT: antalgic STABILITY: stable to testing CREPITUS: no LEG LENGTH DISCREPANCY: none NEUROLOGICAL EXAM: normal VASCULAR EXAM: normal LUMBAR SPINE:        tenderness: no                                     straight leg raising sign: no                                     motor exam: normal   The contralateral hip was examined for comparison and it showed: TENDERNESS: none ROM: normal and full STRENGTH: normal STABILITY: stable to testing   Hip Imaging :  I reviewed the images of three-view x-rays AP pelvis right AP hip and right frog-leg lateral taken in the emergency room on 11/29/2021 shows degenerative changes to the right hip with medial superior joint space narrowing osteophyte formation superior laterally with an osteophyte over the lateral edge of the femoral head and inferiorly on the femoral head.  Moderate to severe degenerative changes of the right hip.  The left hip is noted to have mild to moderate degenerative changes with a lateral acetabular osteophyte and inferior medial joint space narrowing.  No  fractures dislocations noted either hip there is an incidentally noted Calcification in the right iliac wing adjacent to the SI joint.    MRI images of the right hip and pelvis images and report reviewed from 02/15/2022 which shows moderate degenerative changes of the right hip with superior lateral labral degeneration and moderate chondral thinning with several areas of surface irregularity throughout the joint and reactive subchondral marrow edema within the acetabulum.  Some tendinosis of the glut minimus tendon is also noted.  Small bone island noted within the right iliac wing at the area of calcification noted on x-ray.  No fracture dislocations noted   I reviewed 5 views of the AP pelvis right hip and crosstable sit/stand x-rays of the lumbosacral spine and pelvis taken today in clinic and images reviewed by myself.  Which show degenerative changes of the right hip with medial superior joint space narrowing, osteophyte formation superior laterally with an osteophyte over the lateral edge of the femoral head and inferiorly on the femoral head.  Consistent with moderate to severe degenerative changes of the right hip.  Lateral sit/stand x-rays show only 1 degree of sacral inclination motion from sitting to standing consistent with a very stiff lumbosacral spine.  Fractures or dislocations noted   Assessment:  Right hip osteoarthritis, right hip glut tendinopathy   Plan: Based on the clinical and radiographic evaluation of the patient their primary symptomology is likely secondary to osteoarthritis in the right hip.  Based on a slightly unclear etiology for her pain I had my partner Dr. Candelaria Stagers come to the room and do an ultrasound-guided lidocaine bupivacaine injection into the right hip joint.  15 minutes after  injection the patient reported near complete relief over 90% of her pain and was able to ambulate around the room without difficulty or pain.  Based on the the physical exam findings and  imaging findings I feel it the hip arthritis is the primary source of the patient's pain.   Based upon the patient's continued symptoms and failure to respond to conservative treatment, I have recommended a right total hip replacement for this patient. A long discussion took place with the patient describing what a total joint replacement is and what the procedure would entail. A hip model, similar to the implants that will be used during the operation, was utilized to demonstrate the implants. Choices of implant manufactures were discussed and reviewed. The ability to secure the implant utilizing cement or cementless (press fit) fixation was discussed. Anterior and posterior exposures were discussed. For this patient an appropriate approach will be posterior will also allow for debridement of any bursitis or inflammation around the glut minimus tendon.   The hospitalization and post-operative care and rehabilitation were also discussed. The use of perioperative antibiotics and DVT prophylaxis were discussed. The risk, benefits and alternatives to a surgical intervention were discussed at length with the patient. The patient was also advised of risks related to the medical comorbidities and elevated body mass index (BMI). A lengthy discussion took place to review the most common complications including but not limited to: deep vein thrombosis, pulmonary embolus, heart attack, stroke, infection, wound breakdown, dislocation, numbness, leg length in-equality, damage to nerves, tendon,muscles, arteries or other blood vessels, death and other possible complications from anesthesia. The patient was told that we will take steps to minimize these risks by using sterile technique, antibiotics and DVT prophylaxis when appropriate and follow the patient postoperatively in the office setting to monitor progress. The possibility of recurrent pain, no improvement in pain and actual worsening of pain were also discussed with  the patient. The risk of dislocation following total hip replacement was discussed and potential precautions to prevent dislocation were reviewed.    The discharge plan of care focused on the patient going home following surgery. The patient was encouraged to make the necessary arrangements to have someone stay with them when they are discharged home.    The benefits of surgery were discussed with the patient including the potential for improving the patient's current clinical condition through operative intervention. Alternatives to surgical intervention including continued conservative management were also discussed in detail. All questions were answered to the satisfaction of the patient. The patient participated and agreed to the plan of care as well as the use of the recommended implants for their total hip replacement surgery. An information packet was given to the patient to review prior to surgery.           The patient received medical clearance for surgery History and phyiscal updated on day of surgery All questions answered. Plan for right posterior total hip

## 2022-03-18 NOTE — Anesthesia Procedure Notes (Addendum)
Spinal  Patient location during procedure: OR Start time: 03/18/2022 7:40 AM End time: 03/18/2022 7:42 AM Reason for block: surgical anesthesia Staffing Performed: other anesthesia staff and resident/CRNA  Resident/CRNA: Mathews Argyle, CRNA Other anesthesia staff: Jasper Loser, RN Performed by: Jasper Loser, RN Authorized by: Reed Breech, MD   Preanesthetic Checklist Completed: patient identified, IV checked, site marked, risks and benefits discussed, surgical consent, monitors and equipment checked, pre-op evaluation and timeout performed Spinal Block Patient position: sitting Prep: ChloraPrep Patient monitoring: heart rate, cardiac monitor, continuous pulse ox and blood pressure Approach: midline Location: L3-4 Injection technique: single-shot Needle Needle type: Pencan  Needle gauge: 24 G Needle length: 9 cm Assessment Sensory level: T4 Events: CSF return

## 2022-03-19 ENCOUNTER — Encounter: Payer: Self-pay | Admitting: Orthopedic Surgery

## 2022-03-19 DIAGNOSIS — M1611 Unilateral primary osteoarthritis, right hip: Secondary | ICD-10-CM | POA: Diagnosis not present

## 2022-03-19 LAB — GLUCOSE, CAPILLARY: Glucose-Capillary: 126 mg/dL — ABNORMAL HIGH (ref 70–99)

## 2022-03-19 LAB — BASIC METABOLIC PANEL
Anion gap: 5 (ref 5–15)
BUN: 19 mg/dL (ref 8–23)
CO2: 26 mmol/L (ref 22–32)
Calcium: 8.5 mg/dL — ABNORMAL LOW (ref 8.9–10.3)
Chloride: 109 mmol/L (ref 98–111)
Creatinine, Ser: 1.31 mg/dL — ABNORMAL HIGH (ref 0.44–1.00)
GFR, Estimated: 44 mL/min — ABNORMAL LOW (ref >60)
Glucose, Bld: 146 mg/dL — ABNORMAL HIGH (ref 70–99)
Potassium: 3.9 mmol/L (ref 3.5–5.1)
Sodium: 140 mmol/L (ref 135–145)

## 2022-03-19 LAB — CBC
HCT: 32.5 % — ABNORMAL LOW (ref 36.0–46.0)
Hemoglobin: 10.8 g/dL — ABNORMAL LOW (ref 12.0–15.0)
MCH: 30.7 pg (ref 26.0–34.0)
MCHC: 33.2 g/dL (ref 30.0–36.0)
MCV: 92.3 fL (ref 80.0–100.0)
Platelets: 235 10*3/uL (ref 150–400)
RBC: 3.52 MIL/uL — ABNORMAL LOW (ref 3.87–5.11)
RDW: 12.2 % (ref 11.5–15.5)
WBC: 10.3 10*3/uL (ref 4.0–10.5)
nRBC: 0 % (ref 0.0–0.2)

## 2022-03-19 LAB — SURGICAL PATHOLOGY

## 2022-03-19 MED ORDER — DOCUSATE SODIUM 100 MG PO CAPS: 100.0000 mg | ORAL_CAPSULE | Freq: Two times a day (BID) | ORAL | 0 refills | Status: DC

## 2022-03-19 MED ORDER — TRAMADOL HCL 50 MG PO TABS: 50.0000 mg | ORAL_TABLET | Freq: Four times a day (QID) | ORAL | 0 refills | Status: DC | PRN

## 2022-03-19 MED ORDER — SODIUM CHLORIDE 0.9 % IV BOLUS
500.0000 mL | Freq: Once | INTRAVENOUS | Status: AC
  Administered 2022-03-19: 500 mL via INTRAVENOUS

## 2022-03-19 MED ORDER — LISINOPRIL 20 MG PO TABS
20.0000 mg | ORAL_TABLET | Freq: Every day | ORAL | Status: DC
  Administered 2022-03-19: 20 mg via ORAL
  Filled 2022-03-19: qty 1

## 2022-03-19 MED ORDER — ACETAMINOPHEN 500 MG PO TABS: 1000.0000 mg | ORAL_TABLET | Freq: Three times a day (TID) | ORAL | 0 refills | Status: AC

## 2022-03-19 MED ORDER — HYDROCHLOROTHIAZIDE 25 MG PO TABS: 25.0000 mg | ORAL_TABLET | Freq: Every day | ORAL | Status: DC

## 2022-03-19 MED ORDER — CELECOXIB 100 MG PO CAPS: 100.0000 mg | ORAL_CAPSULE | Freq: Two times a day (BID) | ORAL | 0 refills | Status: AC

## 2022-03-19 MED ORDER — ENOXAPARIN SODIUM 40 MG/0.4ML IJ SOSY: 40.0000 mg | PREFILLED_SYRINGE | INTRAMUSCULAR | 0 refills | Status: DC

## 2022-03-19 NOTE — Discharge Instructions (Signed)
Instructions after Posterior Total Hip Replacement        Dr. Zachary Aberman, Jr., M.D.      Dept. of Orthopaedics & Sports Medicine  [facility]  [address]  [place], [place]  [address]  Phone: [phone]   Fax: [phone]    DIET: Drink plenty of non-alcoholic fluids. Resume your normal diet. Include foods high in fiber.  ACTIVITY:  You may use crutches or a walker with weight-bearing as tolerated, unless instructed otherwise. You may be weaned off of the walker or crutches by your Physical Therapist.  Do NOT reach below the level of your knees or cross your legs until allowed.    Continue doing gentle exercises. Exercising will reduce the pain and swelling, increase motion, and prevent muscle weakness.   Please continue to use the TED compression stockings for 2 weeks. You may remove the stockings at night, but should reapply them in the morning. Do not drive or operate any equipment until instructed.  WOUND CARE:  Continue to use ice packs periodically to reduce pain and swelling. You may shower with honeycomb dressing 3 days after surgery. Do not submerge incision site under water. Remove honeycomb dressing 7 days after surgery and allow dermabond to fall off on its own.   MEDICATIONS: You may resume your regular medications. Please take the pain medication as prescribed on the medication list. Do not take pain medication on an empty stomach. You have been given a prescription for a blood thinner to prevent blood clots. Please take the medication as instructed. (NOTE: After completing a 2 week course of Lovenox, take one Enteric-coated 81 mg aspirin twice a day.) Pain medications and iron supplements can cause constipation. Use a stool softener (Senokot or Colace) on a daily basis and a laxative (dulcolax or miralax) as needed. Do not drive or drink alcoholic beverages when taking pain medications.   POSTOPERATIVE CONSTIPATION PROTOCOL Constipation -  defined medically as fewer than three stools per week and severe constipation as less than one stool per week.  One of the most common issues patients have following surgery is constipation.  Even if you have a regular bowel pattern at home, your normal regimen is likely to be disrupted due to multiple reasons following surgery.  Combination of anesthesia, postoperative narcotics, change in appetite and fluid intake all can affect your bowels.  In order to avoid complications following surgery, here are some recommendations in order to help you during your recovery period.  Colace (docusate) - Pick up an over-the-counter form of Colace or another stool softener and take twice a day as long as you are requiring postoperative pain medications.  Take with a full glass of water daily.  If you experience loose stools or diarrhea, hold the colace until you stool forms back up.  If your symptoms do not get better within 1 week or if they get worse, check with your doctor.  Dulcolax (bisacodyl) - Pick up over-the-counter and take as directed by the product packaging as needed to assist with the movement of your bowels.  Take with a full glass of water.  Use this product as needed if not relieved by Colace only.   MiraLax (polyethylene glycol) - Pick up over-the-counter to have on hand.  MiraLax is a solution that will increase the amount of water in your bowels to assist with bowel movements.  Take as directed and can mix with a glass of water, juice, soda, coffee, or tea.  Take if you   go more than two days without a movement. Do not use MiraLax more than once per day. Call your doctor if you are still constipated or irregular after using this medication for 7 days in a row.  If you continue to have problems with postoperative constipation, please contact the office for further assistance and recommendations.  If you experience "the worst abdominal pain ever" or develop nausea or vomiting, please contact the  office immediatly for further recommendations for treatment.   CALL THE OFFICE FOR: Temperature above 101 degrees Excessive bleeding or drainage on the dressing. Excessive swelling, coldness, or paleness of the toes. Persistent nausea and vomiting.  FOLLOW-UP:  You should have an appointment to return to the office in 2 weeks after surgery. Arrangements have been made for continuation of Physical Therapy (either home therapy or outpatient therapy).  

## 2022-03-19 NOTE — Progress Notes (Signed)
Patient administered lovenox injection.

## 2022-03-19 NOTE — Discharge Summary (Signed)
Physician Discharge Summary  Patient ID: TILLY PERNICE MRN: [id number] DOB/AGE: Mar 08, 1951 71 y.o.  Admit date: 03/18/2022 Discharge date: 03/19/2022  Admission Diagnoses:  Osteoarthritis of right hip [M16.11]   Discharge Diagnoses: Patient Active Problem List   Diagnosis Date Noted   Osteoarthritis of right hip 03/18/2022   Problems with swallowing and mastication    Diarrhea    Esophageal dysphagia    Gastritis without bleeding    Stricture and stenosis of esophagus    Breast mass, left 07/24/2019   Obesity (BMI 30.0-34.9) 06/19/2019   Chronic left shoulder pain 06/12/2019   Osteopenia of multiple sites 06/12/2019   Palpitations 04/16/2019   Vitamin D deficiency 12/14/2018   Hx of lithotripsy 12/13/2018   Kidney stone on left side 05/22/2018   Degenerative tear of glenoid labrum of right shoulder 01/21/2017   Biceps tendinitis of right upper extremity 01/21/2017   Urine test positive for microalbuminuria 08/31/2016   Chronic insomnia 08/20/2014   Anxiety 02/19/2014   Diabetes mellitus type 2, uncomplicated (HCC) 01/22/2014   Hypercholesterolemia 01/22/2014   Hypertension 01/22/2014   Back pain 07/05/2013   Foreign body in bladder and urethra 12/07/2012   Renal colic 11/15/2012   Disorder of calcium metabolism 03/09/2012   Gross hematuria 03/09/2012   Hydronephrosis 03/09/2012   Kidney stone 03/09/2012   Mixed urge and stress incontinence 03/09/2012   Ureteric stone 03/09/2012    Past Medical History:  Diagnosis Date   Anesthesia complication    bradycardia   Anxiety    Arthritis    lower back, left hip    Chicken pox    Chronic kidney disease    Depression    Diabetes mellitus without complication (HCC)    GERD (gastroesophageal reflux disease)    Hemochromatosis carrier    History of hiatal hernia    History of kidney stones    Hypercholesterolemia    Hypertension    Migraines    rare now   Nephrolithiasis    PONV (postoperative nausea and  vomiting)    VOMITED A LITTLE BIT AFTER KIDNEY STONE SURGERY     Transfusion: none   Consultants (if any):   Discharged Condition: Improved  Hospital Course: Sheryl [name] is an 71 y.o. female who was admitted 03/18/2022 with a diagnosis of Osteoarthritis of right hip and went to the operating room on 03/18/2022 and underwent the above named procedures.    Surgeries: Procedure(s): Right posterior total hip arthroplasty on 03/18/2022 Patient tolerated the surgery well. Taken to PACU where she was stabilized and then transferred to the orthopedic floor.  Started on Lovenox 40 mg q 24 hrs. TEDs and SCDs applied bilaterally. Heels elevated on bed. No evidence of DVT. Negative Homan. Physical therapy started on day #1 for gait training and transfer. OT started day #1 for ADL and assisted devices.  Patient's IV was d/c on day #1. Patient was able to safely and independently complete all PT goals. PT recommending discharge to home.    On post op day #1 patient was stable and ready for discharge to home with HHPT.  Implants: Cup 4mm w/1 screw Trident II clusterhole    Liner MDM 78mm/D  Stem Insignia #4 high offset  Head MDM38/22.2 w/ 22.2 +23mm LFIT V40 head   She was given perioperative antibiotics:  Anti-infectives (From admission, onward)    Start     Dose/Rate Route Frequency Ordered Stop   03/18/22 1400  ceFAZolin (ANCEF) IVPB 2g/100 mL premix  2 g 200 mL/hr over 30 Minutes Intravenous Every 6 hours 03/18/22 1213 03/18/22 2058   03/18/22 0621  ceFAZolin (ANCEF) 2-4 GM/100ML-% IVPB       Note to Pharmacy: Candelaria Stagers E: cabinet override      03/18/22 0621 03/18/22 0745   03/18/22 0600  ceFAZolin (ANCEF) IVPB 2g/100 mL premix        2 g 200 mL/hr over 30 Minutes Intravenous On call to O.R. 03/18/22 0147 03/18/22 0744     .  She was given sequential compression devices, early ambulation, and lovenox for DVT prophylaxis.  She benefited maximally from the hospital stay  and there were no complications.    Recent vital signs:  Vitals:   03/19/22 0353 03/19/22 0741  BP: (!) 119/48 (!) 113/48  Pulse: (!) 54 71  Resp: 18 17  Temp: 97.7 F (36.5 C) 97.6 F (36.4 C)  SpO2: 100% 100%    Recent laboratory studies:  Lab Results  Component Value Date   HGB 10.8 (L) 03/19/2022   HGB 14.0 03/08/2022   HGB 14.3 12/10/2018   Lab Results  Component Value Date   WBC 10.3 03/19/2022   PLT 235 03/19/2022   Lab Results  Component Value Date   INR 0.98 05/22/2018   Lab Results  Component Value Date   NA 140 03/19/2022   K 3.9 03/19/2022   CL 109 03/19/2022   CO2 26 03/19/2022   BUN 19 03/19/2022   CREATININE 1.31 (H) 03/19/2022   GLUCOSE 146 (H) 03/19/2022    Discharge Medications:   Allergies as of 03/19/2022   No Known Allergies      Medication List     STOP taking these medications    meloxicam 15 MG tablet Commonly known as: MOBIC       TAKE these medications    acetaminophen 500 MG tablet Commonly known as: TYLENOL Take 2 tablets (1,000 mg total) by mouth every 8 (eight) hours. What changed:  when to take this reasons to take this   buPROPion 300 MG 24 hr tablet Commonly known as: WELLBUTRIN XL Take 300 mg by mouth daily.   celecoxib 100 MG capsule Commonly known as: CeleBREX Take 1 capsule (100 mg total) by mouth 2 (two) times daily for 7 days.   cyanocobalamin 1000 MCG tablet Commonly known as: VITAMIN B12 Take 1,000 mcg by mouth daily.   docusate sodium 100 MG capsule Commonly known as: COLACE Take 1 capsule (100 mg total) by mouth 2 (two) times daily.   DULoxetine 60 MG capsule Commonly known as: CYMBALTA Take 60 mg by mouth daily.   enoxaparin 40 MG/0.4ML injection Commonly known as: LOVENOX Inject 0.4 mLs (40 mg total) into the skin daily for 14 days.   lisinopril-hydrochlorothiazide 20-25 MG tablet Commonly known as: ZESTORETIC Take 0.5 tablets by mouth daily.   metFORMIN 500 MG tablet Commonly  known as: GLUCOPHAGE Take 500 mg by mouth 2 (two) times daily with a meal.   pantoprazole 40 MG tablet Commonly known as: PROTONIX Take 40 mg by mouth daily.   potassium chloride 8 MEQ tablet Commonly known as: KLOR-CON 8 mEq daily.   propranolol ER 60 MG 24 hr capsule Commonly known as: INDERAL LA Take 60 mg by mouth daily.   rosuvastatin 5 MG tablet Commonly known as: CRESTOR Take 5 mg by mouth at bedtime.   traMADol 50 MG tablet Commonly known as: ULTRAM Take 1 tablet (50 mg total) by mouth every 6 (six) hours as  needed for moderate pain.   traZODone 50 MG tablet Commonly known as: DESYREL Take 50 mg by mouth at bedtime.   Vitamin D3 50 MCG (2000 UT) capsule Take 2,000 Units by mouth daily.        Diagnostic Studies: DG Pelvis Portable  Result Date: 03/18/2022 CLINICAL DATA:  Status post right posterior total hip arthroplasty EXAM: PORTABLE PELVIS 1-2 VIEWS COMPARISON:  01/04/2022 KUB FINDINGS: Interval right total hip arthroplasty with well-positioned right acetabular and proximal right femoral prostheses. No evidence of hip dislocation on this single frontal view. No bone fractures. Degenerative changes in the visualized lower lumbar spine. No suspicious focal osseous lesions. Expected soft tissue gas surrounding the right hip joint. IMPRESSION: Satisfactory immediate postoperative appearance status post right total hip arthroplasty. Electronically Signed   By: Delbert Phenix M.D.   On: 03/18/2022 09:49    Disposition:      Follow-up Information     Evon Slack, PA-C Follow up in 2 week(s).   Specialties: Orthopedic Surgery, Emergency Medicine Contact information: [address] [place] [address] [phone]                  Signed: Patience Musca 03/19/2022, 8:23 AM

## 2022-03-19 NOTE — Plan of Care (Signed)
  Problem: Education: Goal: Ability to describe self-care measures that may prevent or decrease complications (Diabetes Survival Skills Education) will improve Outcome: Completed/Met Goal: Individualized Educational Video(s) Outcome: Completed/Met   Problem: Coping: Goal: Ability to adjust to condition or change in health will improve Outcome: Completed/Met   Problem: Fluid Volume: Goal: Ability to maintain a balanced intake and output will improve Outcome: Completed/Met   Problem: Health Behavior/Discharge Planning: Goal: Ability to identify and utilize available resources and services will improve Outcome: Completed/Met Goal: Ability to manage health-related needs will improve Outcome: Completed/Met   Problem: Metabolic: Goal: Ability to maintain appropriate glucose levels will improve Outcome: Completed/Met   Problem: Nutritional: Goal: Maintenance of adequate nutrition will improve Outcome: Completed/Met Goal: Progress toward achieving an optimal weight will improve Outcome: Completed/Met   Problem: Skin Integrity: Goal: Risk for impaired skin integrity will decrease Outcome: Completed/Met   Problem: Tissue Perfusion: Goal: Adequacy of tissue perfusion will improve Outcome: Completed/Met   Problem: Education: Goal: Knowledge of the prescribed therapeutic regimen will improve Outcome: Completed/Met Goal: Understanding of discharge needs will improve Outcome: Completed/Met Goal: Individualized Educational Video(s) Outcome: Completed/Met   Problem: Activity: Goal: Ability to avoid complications of mobility impairment will improve Outcome: Completed/Met Goal: Ability to tolerate increased activity will improve Outcome: Completed/Met   Problem: Clinical Measurements: Goal: Postoperative complications will be avoided or minimized Outcome: Completed/Met   Problem: Pain Management: Goal: Pain level will decrease with appropriate interventions Outcome:  Completed/Met   Problem: Skin Integrity: Goal: Will show signs of wound healing Outcome: Completed/Met   Problem: Education: Goal: Knowledge of General Education information will improve Description: Including pain rating scale, medication(s)/side effects and non-pharmacologic comfort measures Outcome: Completed/Met   Problem: Health Behavior/Discharge Planning: Goal: Ability to manage health-related needs will improve Outcome: Completed/Met   Problem: Clinical Measurements: Goal: Ability to maintain clinical measurements within normal limits will improve Outcome: Completed/Met Goal: Will remain free from infection Outcome: Completed/Met Goal: Diagnostic test results will improve Outcome: Completed/Met Goal: Respiratory complications will improve Outcome: Completed/Met Goal: Cardiovascular complication will be avoided Outcome: Completed/Met   Problem: Activity: Goal: Risk for activity intolerance will decrease Outcome: Completed/Met   Problem: Nutrition: Goal: Adequate nutrition will be maintained Outcome: Completed/Met   Problem: Coping: Goal: Level of anxiety will decrease Outcome: Completed/Met   Problem: Elimination: Goal: Will not experience complications related to bowel motility Outcome: Completed/Met Goal: Will not experience complications related to urinary retention Outcome: Completed/Met   Problem: Pain Managment: Goal: General experience of comfort will improve Outcome: Completed/Met   Problem: Safety: Goal: Ability to remain free from injury will improve Outcome: Completed/Met   Problem: Skin Integrity: Goal: Risk for impaired skin integrity will decrease Outcome: Completed/Met   

## 2022-03-19 NOTE — Evaluation (Signed)
Occupational Therapy Evaluation Patient Details Name: Sheryl [name] MRN: [id number] DOB: September 16, 1950 Today's Date: 03/19/2022   History of Present Illness Sheryl [name] is a 71yoF who [facility] on [facility]/18[facility]22 for elective Rt hip THA, posterior approach, WBAT. PT pt was independent in ADL performance, AMBhousehold to limited community distances with intermittent DME, generally resistance to use of equipment unless otherwise needed. Pt lives at home with her grandDTR and other family who plan to assist at DC.   Clinical Impression   Pt seen for OT evaluation this date, POD#1 from above surgery. Pt was independent in all ADLs prior to surgery, however occasionally using RW or SPC for mobility due to R hip pain. Pt is eager to return to PLOF with less pain and improved safety and independence. Pt currently requires max assist for LB dressing while in seated position due limited AROM of R hip. Pt able to recall 1/3 posterior total hip precautions at start of session and unable to verbalize how to implement during ADL and mobility. Pt instructed in posterior total hip precautions and how to implement, self care skills, falls prevention strategies, home/routines modifications, DME/AE for LB bathing and dressing tasks, compression stocking mgt strategies, and car transfer techniques. At end of session, pt able to recall 2/3 posterior total hip precautions. Pt's granddaughter instructed in hip precautions, home care needs, and verbalizes understanding. No additional OT needs at present.   Recommendations for follow up therapy are one component of a multi-disciplinary discharge planning process, led by the attending physician.  Recommendations may be updated based on patient status, additional functional criteria and insurance authorization.   Follow Up Recommendations  No OT follow up     Assistance Recommended at Discharge Intermittent Supervision/Assistance  Patient can return home with the following A  little help with bathing/dressing/bathroom;Assist for transportation;Assistance with cooking/housework    Functional Status Assessment  Patient has had a recent decline in their functional status and demonstrates the ability to make significant improvements in function in a reasonable and predictable amount of time.  Equipment Recommendations  None recommended by OT    Recommendations for Other Services       Precautions / Restrictions Precautions Precautions: Posterior Hip Precaution Booklet Issued: Yes (comment) Restrictions Weight Bearing Restrictions: Yes RLE Weight Bearing: Weight bearing as tolerated      Mobility Bed Mobility               General bed mobility comments: In recliner pre/post session    Transfers Overall transfer level: Needs assistance Equipment used: Rolling walker (2 wheels) Transfers: Sit to/from Stand Sit to Stand: Modified independent (Device/Increase time)           General transfer comment: able to move sit<>stand w/o pain or LOB, using RW      Balance Overall balance assessment: Needs assistance   Sitting balance-Leahy Scale: Good     Standing balance support: During functional activity, Bilateral upper extremity supported Standing balance-Leahy Scale: Good Standing balance comment: impulsive, forgets on occasion to bring RW along                           ADL either performed or assessed with clinical judgement   ADL Overall ADL's : Needs assistance/impaired                     Lower Body Dressing: Maximal assistance   Toilet Transfer: Supervision/safety;Regular Toilet;Rolling walker (2 wheels)   Toileting-  Clothing Manipulation and Hygiene: Supervision/safety               Vision         Perception     Praxis      Pertinent Vitals/Pain Pain Assessment Pain Score: 1  Pain Location: incision Pain Intervention(s): Repositioned, Ice applied     Hand Dominance Right    Extremity/Trunk Assessment Upper Extremity Assessment Upper Extremity Assessment: Overall WFL for tasks assessed   Lower Extremity Assessment Lower Extremity Assessment: Overall WFL for tasks assessed;RLE deficits/detail RLE Deficits / Details: s/p R THA       Communication Communication Communication: No difficulties   Cognition Arousal/Alertness: Awake/alert Behavior During Therapy: Impulsive Overall Cognitive Status: Within Functional Limits for tasks assessed                                 General Comments: had difficulty recalling precautions     General Comments       Exercises Other Exercises Other Exercises: Educ re: hip precautions, AE for LB dressing/bathing, falls prevention   Shoulder Instructions      Home Living Family/patient expects to be discharged to:: Private residence Living Arrangements: Other relatives Available Help at Discharge: Family Type of Home: House Home Access: Stairs to enter Secretary/administrator of Steps: 3 Entrance Stairs-Rails: Right;Left;Can reach both Home Layout: One level     Bathroom Shower/Tub: Producer, television/film/video: Handicapped height     Home Equipment: Wheelchair - Forensic psychologist (2 wheels);Cane - single point;Shower seat;Grab bars - tub/shower          Prior Functioning/Environment Prior Level of Function : Independent/Modified Independent             Mobility Comments: intermittent use of SPC and RW. Reports 5 or more falls this year.          OT Problem List: Decreased strength;Decreased activity tolerance;Decreased range of motion;Impaired balance (sitting and/or standing);Decreased knowledge of use of DME or AE      OT Treatment/Interventions:      OT Goals(Current goals can be found in the care plan section) Acute Rehab OT Goals Patient Stated Goal: to go home today OT Goal Formulation: With patient Time For Goal Achievement: 04/02/22 Potential to Achieve  Goals: Good  OT Frequency:      Co-evaluation              AM-PAC OT "6 Clicks" Daily Activity     Outcome Measure Help from another person eating meals?: None Help from another person taking care of personal grooming?: None Help from another person toileting, which includes using toliet, bedpan, or urinal?: None Help from another person bathing (including washing, rinsing, drying)?: A Little Help from another person to put on and taking off regular upper body clothing?: None Help from another person to put on and taking off regular lower body clothing?: A Lot 6 Click Score: 21   End of Session Equipment Utilized During Treatment: Rolling walker (2 wheels)  Activity Tolerance: Patient tolerated treatment well Patient left: in chair;with family/visitor present;with call bell/phone within reach  OT Visit Diagnosis: Muscle weakness (generalized) (M62.81);Unsteadiness on feet (R26.81);History of falling (Z91.81)                Time: 1829-9371 OT Time Calculation (min): 17 min Charges:  OT General Charges $OT Visit: 1 Visit OT Evaluation $OT Eval Low Complexity: 1 Low OT Treatments $Self Care/Home  Management : 8-22 mins Latina Craver, PhD, MS, OTR/L 03/19/22, 10:09 AM

## 2022-03-19 NOTE — Progress Notes (Addendum)
   Subjective: 1 Day Post-Op Procedure(s) (LRB): Right posterior total hip arthroplasty (Right) Patient reports pain as mild.   Patient is well, and has had no acute complaints or problems Denies any CP, SOB, ABD pain. We will continue therapy today.  Plan is to go Home after hospital stay.  Objective: Vital signs in last 24 hours: Temp:  [97 F (36.1 C)-98.7 F (37.1 C)] 97.6 F (36.4 C) (12/15 0741) Pulse Rate:  [54-85] 71 (12/15 0741) Resp:  [9-23] 17 (12/15 0741) BP: (83-122)/(48-81) 113/48 (12/15 0741) SpO2:  [90 %-100 %] 100 % (12/15 0741)  Intake/Output from previous day: 12/14 0701 - 12/15 0700 In: 1421.3 [P.O.:240; I.V.:781.2; IV Piggyback:400.1] Out: 650 [Urine:450; Blood:200] Intake/Output this shift: No intake/output data recorded.  Recent Labs    03/19/22 0339  HGB 10.8*   Recent Labs    03/19/22 0339  WBC 10.3  RBC 3.52*  HCT 32.5*  PLT 235   Recent Labs    03/19/22 0339  NA 140  K 3.9  CL 109  CO2 26  BUN 19  CREATININE 1.31*  GLUCOSE 146*  CALCIUM 8.5*   No results for input(s): "LABPT", "INR" in the last 72 hours.  EXAM General - Patient is Alert, Appropriate, and Oriented Extremity - Neurovascular intact Sensation intact distally Intact pulses distally Dorsiflexion/Plantar flexion intact Dressing - dressing C/D/I and no drainage Motor Function - intact, moving foot and toes well on exam.   Past Medical History:  Diagnosis Date   Anesthesia complication    bradycardia   Anxiety    Arthritis    lower back, left hip    Chicken pox    Chronic kidney disease    Depression    Diabetes mellitus without complication (HCC)    GERD (gastroesophageal reflux disease)    Hemochromatosis carrier    History of hiatal hernia    History of kidney stones    Hypercholesterolemia    Hypertension    Migraines    rare now   Nephrolithiasis    PONV (postoperative nausea and vomiting)    VOMITED A LITTLE BIT AFTER KIDNEY STONE SURGERY     Assessment/Plan:   1 Day Post-Op Procedure(s) (LRB): Right posterior total hip arthroplasty (Right) Principal Problem:   Osteoarthritis of right hip  Estimated body mass index is 29.58 kg/m as calculated from the following:   Height as of this encounter: 5\' 3"  (1.6 m).   Weight as of this encounter: 75.8 kg. Advance diet Up with therapy Vital signs stable.-BP soft.  Will hold BP meds and give 500 cc bolus of fluids  Acute renal insufficiency -slight bump in creatinine.  CR 1.3 will give 500 cc bolus of fluids.  Baseline CR 1.1  Pain well-controlled  Care management to assist with discharge to home with home health PT.  Anticipate discharge to home with home health PT today  DVT Prophylaxis - Lovenox, TED hose, and SCDs Weight-Bearing as tolerated to right leg   T. , PA-C [facility] 03/19/2022, 8:17 AM  Patient seen and examined, agree with above plan.  The patient is doing well status post right posterior THA, no concerns at this time.  Pain is controlled.  Discussed DVT prophylaxis, pain medication use, and safe transition to home.  All questions answered the patient agrees with above plan will go  home after clears PT.   03/21/2022 MD

## 2022-03-19 NOTE — Progress Notes (Signed)
Physical Therapy Treatment Patient Details Name: Sheryl [name] MRN: [id number] DOB: 1950-12-21 Today's Date: 03/19/2022   History of Present Illness Sheryl [name] is a 71yoF who comes to [facility] on 03/18/22 for elective Rt hip THA, posterior approach, WBAT. PT pt was independent in ADL performance, AMBhousehold to limited community distances with intermittent DME, generally resistance to use of equipment unless otherwise needed. Pt lives at home with her grandDTR and other family who plan to assist at DC.    PT Comments    Pt was sitting in recliner finishing up breakfast. Agrees to session and is cooperative throughout. Easily and safely able to stand, ambulate, and perform stairs. Pt states confidence in her abilities to safely DC home. Supportive daughter was present and will be assisting pt at DC. Recommend HHPT to follow to maximize her independence with all ADLs.    Recommendations for follow up therapy are one component of a multi-disciplinary discharge planning process, led by the attending physician.  Recommendations may be updated based on patient status, additional functional criteria and insurance authorization.  Follow Up Recommendations  Home health PT     Assistance Recommended at Discharge Set up Supervision/Assistance  Patient can return home with the following Assist for transportation;Assistance with cooking/housework;Help with stairs or ramp for entrance   Equipment Recommendations  None recommended by PT       Precautions / Restrictions Precautions Precautions: Posterior Hip Precaution Booklet Issued: Yes (comment) Restrictions Weight Bearing Restrictions: Yes RLE Weight Bearing: Weight bearing as tolerated     Mobility  Bed Mobility  General bed mobility comments: In recliner pre/post session    Transfers Overall transfer level: Needs assistance Equipment used: Rolling walker (2 wheels) Transfers: Sit to/from Stand Sit to Stand: Modified independent  (Device/Increase time)   Ambulation/Gait Ambulation/Gait assistance: Supervision Gait Distance (Feet): 200 Feet Assistive device: Rolling walker (2 wheels) Gait Pattern/deviations: Step-to pattern Gait velocity: decreased     General Gait Details: no LOB or safety concern. step to pattern   Stairs Stairs: Yes Stairs assistance: Supervision Stair Management: Two rails, Forwards Number of Stairs: 4 General stair comments: no difficulty performing stairs        Cognition Arousal/Alertness: Awake/alert Behavior During Therapy: WFL for tasks assessed/performed Overall Cognitive Status: Within Functional Limits for tasks assessed             Pertinent Vitals/Pain Pain Assessment Pain Assessment: 0-10 Pain Score: 4  Pain Location: incision and/or trochanteric area Pain Descriptors / Indicators: Aching, Operative site guarding Pain Intervention(s): Limited activity within patient's tolerance, Monitored during session, Premedicated before session, Repositioned, Ice applied     PT Goals (current goals can now be found in the care plan section) Acute Rehab PT Goals Patient Stated Goal: go home Progress towards PT goals: Progressing toward goals    Frequency    BID      PT Plan Current plan remains appropriate       AM-PAC PT "6 Clicks" Mobility   Outcome Measure  Help needed turning from your back to your side while in a flat bed without using bedrails?: A Little Help needed moving from lying on your back to sitting on the side of a flat bed without using bedrails?: A Little Help needed moving to and from a bed to a chair (including a wheelchair)?: A Little Help needed standing up from a chair using your arms (e.g., wheelchair or bedside chair)?: A Little Help needed to walk in hospital room?: A Little Help needed climbing  3-5 steps with a railing? : A Little 6 Click Score: 18    End of Session   Activity Tolerance: Patient tolerated treatment well Patient  left: in chair;with call bell/phone within reach;with family/visitor present Nurse Communication: Mobility status PT Visit Diagnosis: Difficulty in walking, not elsewhere classified (R26.2);Other abnormalities of gait and mobility (R26.89)     Time: 1115-5208 PT Time Calculation (min) (ACUTE ONLY): 16 min  Charges:  $Gait Training: 8-22 mins                     Jetta Lout PTA 03/19/22, 9:02 AM

## 2022-03-19 NOTE — Care Management Obs Status (Signed)
MEDICARE OBSERVATION STATUS NOTIFICATION   Patient Details  Name: Sheryl [name] MRN: [id number] Date of Birth: February 06, 1951   Medicare Observation Status Notification Given:  Yes    Marlowe Sax, RN 03/19/2022, 10:52 AM

## 2022-04-26 ENCOUNTER — Other Ambulatory Visit: Payer: Self-pay

## 2022-04-26 ENCOUNTER — Ambulatory Visit
Admission: EM | Admit: 2022-04-26 | Discharge: 2022-04-26 | Disposition: A | Discharge status: Home / Self Care | Payer: Medicare PPO | Attending: Family Medicine | Admitting: Family Medicine

## 2022-04-26 DIAGNOSIS — J189 Pneumonia, unspecified organism: Secondary | ICD-10-CM | POA: Diagnosis not present

## 2022-04-26 MED ORDER — CEFDINIR 300 MG PO CAPS: 300.0000 mg | ORAL_CAPSULE | Freq: Two times a day (BID) | ORAL | 0 refills | Status: AC

## 2022-04-26 MED ORDER — PREDNISONE 20 MG PO TABS: 40.0000 mg | ORAL_TABLET | Freq: Every day | ORAL | 0 refills | Status: AC

## 2022-04-26 NOTE — ED Triage Notes (Signed)
Pt reports 2 weeks of congestion and post nasal drip and deep cough. Productive of yellow phlegm. No fevers.

## 2022-04-26 NOTE — Discharge Instructions (Addendum)
Stop by the pharmacy to pick up your prescriptions.  Follow up in 1 week if not improved. Go to the emergency room if you have chest pain and worsening shortness of breath.

## 2022-04-26 NOTE — ED Provider Notes (Addendum)
[facility]    CSN: [id number] Arrival date & time: 04/26/22  1343      History   Chief Complaint Chief Complaint  Patient [name] with   Cough    HPI Sheryl [name] is a 72 y.o. female.   HPI   Sheryl [name] for cough for the past 2 weeks. Cough productive with yellow-green sputum. Has intermittent rhinorrhea. No fever, chills, nausea, vomiting, diarrhea, shortness of breath, chest pain/discomfort, wheezing.  The cough can get worse at night. Took a lot of OTC medications that aren't helping.  No known sick contacts.  No history of asthma or COPD.   She quit smoking about 13 years ago.        Past Medical History:  Diagnosis Date   Anesthesia complication    bradycardia   Anxiety    Arthritis    lower back, left hip    Chicken pox    Chronic kidney disease    Depression    Diabetes mellitus without complication ([place])    GERD (gastroesophageal reflux disease)    Hemochromatosis carrier    History of hiatal hernia    History of kidney stones    Hypercholesterolemia    Hypertension    Migraines    rare now   Nephrolithiasis    PONV (postoperative nausea and vomiting)    VOMITED A LITTLE BIT AFTER KIDNEY STONE SURGERY    Patient Active Problem List   Diagnosis Date Noted   Osteoarthritis of right hip 03/18/2022   Problems with swallowing and mastication    Diarrhea    Esophageal dysphagia    Gastritis without bleeding    Stricture and stenosis of esophagus    Breast mass, left 07/24/2019   Obesity (BMI 30.0-34.9) 06/19/2019   Chronic left shoulder pain 06/12/2019   Osteopenia of multiple sites 06/12/2019   Palpitations 04/16/2019   Vitamin D deficiency 12/14/2018   Hx of lithotripsy 12/13/2018   Kidney stone on left side 05/22/2018   Degenerative tear of glenoid labrum of right shoulder 01/21/2017   Biceps tendinitis of right upper extremity 01/21/2017   Urine test positive for microalbuminuria 08/31/2016   Chronic insomnia 08/20/2014    Anxiety 02/19/2014   Diabetes mellitus type 2, uncomplicated ([place]) 41/93/7902   Hypercholesterolemia 01/22/2014   Hypertension 01/22/2014   Back pain 07/05/2013   Foreign body in bladder and urethra 40/97/3532   Renal colic 99/24/2683   Disorder of calcium metabolism 03/09/2012   Gross hematuria 03/09/2012   Hydronephrosis 03/09/2012   Kidney stone 03/09/2012   Mixed urge and stress incontinence 03/09/2012   Ureteric stone 03/09/2012    Past Surgical History:  Procedure Laterality Date   BREAST CYST ASPIRATION Left    negative 2012   CHOLECYSTECTOMY     COLONOSCOPY     COLONOSCOPY WITH PROPOFOL N/A 12/25/2020   Procedure: COLONOSCOPY WITH PROPOFOL;  Surgeon: Lucilla Lame, MD;  Location: [facility] ENDOSCOPY;  Service: Endoscopy;  Laterality: N/A;   COLONOSCOPY, ESOPHAGOGASTRODUODENOSCOPY (EGD) AND ESOPHAGEAL DILATION     CYSTOSCOPY W/ RETROGRADES Bilateral 12/04/2018   Procedure: CYSTOSCOPY WITH RETROGRADE PYELOGRAM;  Surgeon: Hollice Espy, MD;  Location: [facility] ORS;  Service: Urology;  Laterality: Bilateral;   CYSTOSCOPY/URETEROSCOPY/HOLMIUM LASER/STENT PLACEMENT Left 06/26/2018   Procedure: CYSTOSCOPY/URETEROSCOPY/HOLMIUM LASER/STENT Exchange;  Surgeon: Hollice Espy, MD;  Location: [facility] ORS;  Service: Urology;  Laterality: Left;   CYSTOSCOPY/URETEROSCOPY/HOLMIUM LASER/STENT PLACEMENT Right 12/04/2018   Procedure: CYSTOSCOPY/URETEROSCOPY/HOLMIUM LASER/STENT PLACEMENT;  Surgeon: Hollice Espy, MD;  Location: [facility] ORS;  Service: Urology;  Laterality: Right;   DISTAL BICEPS TENDON REPAIR Right 01/19/2017   Procedure: BICEPS TENODESIS;  Surgeon: Corky Mull, MD;  Location: [place];  Service: Orthopedics;  Laterality: Right;  Diabetic - oral meds   ESOPHAGOGASTRODUODENOSCOPY (EGD) WITH PROPOFOL N/A 01/31/2015   Procedure: ESOPHAGOGASTRODUODENOSCOPY (EGD) WITH PROPOFOL;  Surgeon: Manya Silvas, MD;  Location: [facility] ENDOSCOPY;  Service: Endoscopy;  Laterality: N/A;    ESOPHAGOGASTRODUODENOSCOPY (EGD) WITH PROPOFOL N/A 06/19/2020   Procedure: ESOPHAGOGASTRODUODENOSCOPY (EGD) WITH PROPOFOL;  Surgeon: Lucilla Lame, MD;  Location: [facility] ENDOSCOPY;  Service: Endoscopy;  Laterality: N/A;   ESOPHAGOGASTRODUODENOSCOPY (EGD) WITH PROPOFOL N/A 12/25/2020   Procedure: ESOPHAGOGASTRODUODENOSCOPY (EGD) WITH PROPOFOL;  Surgeon: Lucilla Lame, MD;  Location: [facility] ENDOSCOPY;  Service: Endoscopy;  Laterality: N/A;   ESOPHAGOGASTRODUODENOSCOPY (EGD) WITH PROPOFOL N/A 12/22/2021   Procedure: ESOPHAGOGASTRODUODENOSCOPY (EGD) WITH PROPOFOL;  Surgeon: Lucilla Lame, MD;  Location: [facility] ENDOSCOPY;  Service: Endoscopy;  Laterality: N/A;   ESOPHAGOGASTRODUODENOSCOPY (EGD) WITH PROPOFOL N/A 01/28/2022   Procedure: ESOPHAGOGASTRODUODENOSCOPY (EGD) WITH PROPOFOL;  Surgeon: Lucilla Lame, MD;  Location: [facility] ENDOSCOPY;  Service: Endoscopy;  Laterality: N/A;   esophogastroduodenoscopy     esophogastroduodeoscopy     HERNIA REPAIR     IR NEPHROSTOMY PLACEMENT LEFT  05/22/2018   NEPHROLITHOTOMY Left 05/22/2018   Procedure: NEPHROLITHOTOMY PERCUTANEOUS;  Surgeon: Hollice Espy, MD;  Location: [facility] ORS;  Service: Urology;  Laterality: Left;   SAVORY DILATION N/A 01/31/2015   Procedure: SAVORY DILATION;  Surgeon: Manya Silvas, MD;  Location: [facility] ENDOSCOPY;  Service: Endoscopy;  Laterality: N/A;   SHOULDER ARTHROSCOPY Right 01/19/2017   Procedure: ARTHROSCOPY SHOULDER DEBRIDEMENT DECOMPRESSION AND  ROTATOR CUFF REPAIR AND BICEPS TENODESIS;  Surgeon: Corky Mull, MD;  Location: [place];  Service: Orthopedics;  Laterality: Right;   TOTAL HIP ARTHROPLASTY Right 03/18/2022   Procedure: Right posterior total hip arthroplasty;  Surgeon: Steffanie Rainwater, MD;  Location: [facility] ORS;  Service: Orthopedics;  Laterality: Right;    OB History   No obstetric history on file.      Home Medications    Prior to Admission medications   Medication Sig Start Date End Date Taking? Authorizing  Provider  cefdinir (OMNICEF) 300 MG capsule Take 1 capsule (300 mg total) by mouth 2 (two) times daily for 5 days. 04/26/22 05/01/22 Yes Caila Cirelli, DO  meloxicam (MOBIC) 15 MG tablet Take by mouth. 04/12/22  Yes [provider]  predniSONE (DELTASONE) 20 MG tablet Take 2 tablets (40 mg total) by mouth daily for 5 days. 04/26/22 05/01/22 Yes Hillery Zachman, DO  acetaminophen (TYLENOL) 500 MG tablet Take 2 tablets (1,000 mg total) by mouth every 8 (eight) hours. 03/19/22   Duanne Guess, PA-C  buPROPion (WELLBUTRIN XL) 300 MG 24 hr tablet Take 300 mg by mouth daily.    [provider]  Cholecalciferol (VITAMIN D3) 50 MCG (2000 UT) capsule Take 2,000 Units by mouth daily.    [provider]  docusate sodium (COLACE) 100 MG capsule Take 1 capsule (100 mg total) by mouth 2 (two) times daily. 03/19/22   Duanne Guess, PA-C  DULoxetine (CYMBALTA) 60 MG capsule Take 60 mg by mouth daily.    [provider]  enoxaparin (LOVENOX) 40 MG/0.4ML injection Inject 0.4 mLs (40 mg total) into the skin daily for 14 days. 03/19/22 04/02/22  Duanne Guess, PA-C  lisinopril-hydrochlorothiazide (ZESTORETIC) 20-25 MG tablet Take 0.5 tablets by mouth daily. 12/14/19 03/18/22  [provider]  metFORMIN (GLUCOPHAGE) 500 MG tablet Take 500  mg by mouth 2 (two) times daily with a meal.    [provider]  pantoprazole (PROTONIX) 40 MG tablet Take 40 mg by mouth daily. 11/03/21   [provider]  potassium chloride (KLOR-CON) 8 MEQ tablet 8 mEq daily. 01/01/20   [provider]  propranolol ER (INDERAL LA) 60 MG 24 hr capsule Take 60 mg by mouth daily. 02/08/22 02/08/23  [provider]  rosuvastatin (CRESTOR) 5 MG tablet Take 5 mg by mouth at bedtime.     [provider]  traMADol (ULTRAM) 50 MG tablet Take 1 tablet (50 mg total) by mouth every 6 (six) hours as needed for moderate pain. 03/19/22   Evon Slack, PA-C  traZODone  (DESYREL) 50 MG tablet Take 50 mg by mouth at bedtime. 01/02/22   [provider]  vitamin B-12 (CYANOCOBALAMIN) 1000 MCG tablet Take 1,000 mcg by mouth daily.    [provider]    Family History History reviewed. No pertinent family history.  Social History Social History   Tobacco Use   Smoking status: Former    Packs/day: 1.50    Years: 25.00    Total pack years: 37.50    Types: Cigarettes    Quit date: 2010    Years since quitting: 14.0   Smokeless tobacco: Never  Vaping Use   Vaping Use: Never used  Substance Use Topics   Alcohol use: No   Drug use: No     Allergies   Patient has no known allergies.   Review of Systems Review of Systems: negative unless otherwise stated in HPI.      Physical Exam Triage Vital Signs ED Triage Vitals  Enc Vitals Group     BP 04/26/22 1426 121/78     Pulse Rate 04/26/22 1426 75     Resp 04/26/22 1426 17     Temp 04/26/22 1426 98.3 F (36.8 C)     Temp Source 04/26/22 1426 Oral     SpO2 04/26/22 1426 96 %     Weight 04/26/22 1422 163 lb (73.9 kg)     Height 04/26/22 1422 5\' 2"  (1.575 m)     Head Circumference --      Peak Flow --      Pain Score 04/26/22 1422 0     Pain Loc --      Pain Edu? --      Excl. in GC? --    No data found.  Updated Vital Signs BP 121/78 (BP Location: Left Arm)   Pulse 75   Temp 98.3 F (36.8 C) (Oral)   Resp 17   Ht 5\' 2"  (1.575 m)   Wt 73.9 kg   SpO2 96%   BMI 29.81 kg/m   Visual Acuity Right Eye Distance:   Left Eye Distance:   Bilateral Distance:    Right Eye Near:   Left Eye Near:    Bilateral Near:     Physical Exam GEN:     alert, non-toxic appearing female in no distress    HENT:  mucus membranes moist, no nasal discharge EYES:   no scleral injection or discharge  RESP:  no increased work of breathing, rales right mid to lower lung, no wheezing CVS:   regular rate and rhythm Skin:   warm and dry    UC Treatments / Results  Labs (all labs  ordered are listed, but only abnormal results are displayed) Labs Reviewed - No data to display  EKG  Radiology No results found.  Procedures Procedures (including critical care time)  Medications Ordered in UC Medications - No data to display  Initial Impression / Assessment and Plan / UC Course  I have reviewed the triage vital signs and the nursing notes.  Pertinent labs & imaging results that were available during my care of the patient were reviewed by me and considered in my medical decision making (see chart for details).      Pt is a 72 y.o. female former smoker who [name] for 2 weeks of cough that is not improving.  [place] is  afebrile here without recent antipyretics. Satting adequately on room air. Overall pt is  non-toxic appearing, well hydrated, without respiratory distress. Pulmonary exam is remarkable for rales in right mid to lower lung.  After shared decision making, we will not pursue chest x-ray at this time as it currently would not change management.  COVID  and influenza testing deferred due to length of symptoms.   Treat acute community acquired pneumonia with steroids and antibiotics as below.  Typical duration of symptoms discussed. Return and ED precautions given and patient voiced understanding.   Discussed MDM, treatment plan and plan for follow-up with patient who agrees with plan.      Final Clinical Impressions(s) / UC Diagnoses   Final diagnoses:  Community acquired pneumonia of right middle lobe of lung     Discharge Instructions      Stop by the pharmacy to pick up your prescriptions.  Follow up in 1 week if not improved. Go to the emergency room if you have chest pain and worsening shortness of breath.       ED Prescriptions     Medication Sig Dispense Auth. Provider   cefdinir (OMNICEF) 300 MG capsule Take 1 capsule (300 mg total) by mouth 2 (two) times daily for 5 days. 10 capsule Teena Mangus, DO   predniSONE (DELTASONE)  20 MG tablet Take 2 tablets (40 mg total) by mouth daily for 5 days. 10 tablet Katha Cabal, DO      PDMP not reviewed this encounter.   Katha Cabal, DO 04/27/22 1308    Katha Cabal, DO 04/27/22 1310

## 2022-05-24 ENCOUNTER — Ambulatory Visit
Admission: RE | Admit: 2022-05-24 | Discharge: 2022-05-24 | Disposition: A | Discharge status: Home / Self Care | Payer: Medicare PPO | Source: Home / Self Care | Attending: Urology | Admitting: Urology

## 2022-05-24 ENCOUNTER — Ambulatory Visit
Admission: RE | Admit: 2022-05-24 | Discharge: 2022-05-24 | Disposition: A | Discharge status: Home / Self Care | Payer: Medicare PPO | Source: Ambulatory Visit | Attending: Urology | Admitting: Urology

## 2022-05-24 ENCOUNTER — Encounter
Admission: EM | Disposition: A | Discharge status: Home / Self Care | Payer: Self-pay | Source: Home / Self Care | Attending: Student

## 2022-05-24 ENCOUNTER — Inpatient Hospital Stay
Admission: EM | Admit: 2022-05-24 | Discharge: 2022-05-26 | DRG: 854 | Disposition: A | Discharge status: Home / Self Care | Payer: Medicare PPO | Attending: Student | Admitting: Student

## 2022-05-24 ENCOUNTER — Inpatient Hospital Stay: Payer: Medicare PPO | Admitting: Certified Registered Nurse Anesthetist

## 2022-05-24 ENCOUNTER — Other Ambulatory Visit
Admission: RE | Admit: 2022-05-24 | Discharge: 2022-05-24 | Disposition: A | Discharge status: Home / Self Care | Payer: Medicare PPO | Source: Home / Self Care | Attending: Urology | Admitting: Urology

## 2022-05-24 ENCOUNTER — Emergency Department: Payer: Medicare PPO

## 2022-05-24 ENCOUNTER — Encounter: Payer: Self-pay | Admitting: Urology

## 2022-05-24 ENCOUNTER — Ambulatory Visit: Payer: Medicare PPO | Admitting: Urology

## 2022-05-24 ENCOUNTER — Other Ambulatory Visit: Payer: Self-pay

## 2022-05-24 ENCOUNTER — Other Ambulatory Visit: Payer: Medicare PPO

## 2022-05-24 ENCOUNTER — Inpatient Hospital Stay: Payer: Medicare PPO

## 2022-05-24 VITALS — BP 150/82 | HR 127 | Ht 63.0 in | Wt 164.0 lb

## 2022-05-24 DIAGNOSIS — I700 Atherosclerosis of aorta: Secondary | ICD-10-CM | POA: Diagnosis present

## 2022-05-24 DIAGNOSIS — Z87891 Personal history of nicotine dependence: Secondary | ICD-10-CM

## 2022-05-24 DIAGNOSIS — A419 Sepsis, unspecified organism: Secondary | ICD-10-CM | POA: Diagnosis present

## 2022-05-24 DIAGNOSIS — N200 Calculus of kidney: Secondary | ICD-10-CM

## 2022-05-24 DIAGNOSIS — I10 Essential (primary) hypertension: Secondary | ICD-10-CM | POA: Diagnosis not present

## 2022-05-24 DIAGNOSIS — R1032 Left lower quadrant pain: Secondary | ICD-10-CM

## 2022-05-24 DIAGNOSIS — E860 Dehydration: Secondary | ICD-10-CM | POA: Diagnosis present

## 2022-05-24 DIAGNOSIS — E876 Hypokalemia: Secondary | ICD-10-CM

## 2022-05-24 DIAGNOSIS — N1831 Chronic kidney disease, stage 3a: Secondary | ICD-10-CM | POA: Diagnosis present

## 2022-05-24 DIAGNOSIS — F32A Depression, unspecified: Secondary | ICD-10-CM | POA: Diagnosis present

## 2022-05-24 DIAGNOSIS — R310 Gross hematuria: Secondary | ICD-10-CM | POA: Diagnosis not present

## 2022-05-24 DIAGNOSIS — N132 Hydronephrosis with renal and ureteral calculous obstruction: Secondary | ICD-10-CM

## 2022-05-24 DIAGNOSIS — E119 Type 2 diabetes mellitus without complications: Secondary | ICD-10-CM

## 2022-05-24 DIAGNOSIS — Z9049 Acquired absence of other specified parts of digestive tract: Secondary | ICD-10-CM

## 2022-05-24 DIAGNOSIS — I129 Hypertensive chronic kidney disease with stage 1 through stage 4 chronic kidney disease, or unspecified chronic kidney disease: Secondary | ICD-10-CM | POA: Diagnosis present

## 2022-05-24 DIAGNOSIS — E872 Acidosis, unspecified: Secondary | ICD-10-CM | POA: Diagnosis present

## 2022-05-24 DIAGNOSIS — N1330 Unspecified hydronephrosis: Secondary | ICD-10-CM | POA: Diagnosis present

## 2022-05-24 DIAGNOSIS — N202 Calculus of kidney with calculus of ureter: Secondary | ICD-10-CM | POA: Diagnosis present

## 2022-05-24 DIAGNOSIS — E1122 Type 2 diabetes mellitus with diabetic chronic kidney disease: Secondary | ICD-10-CM | POA: Diagnosis present

## 2022-05-24 DIAGNOSIS — M810 Age-related osteoporosis without current pathological fracture: Secondary | ICD-10-CM | POA: Diagnosis present

## 2022-05-24 DIAGNOSIS — F419 Anxiety disorder, unspecified: Secondary | ICD-10-CM | POA: Diagnosis present

## 2022-05-24 DIAGNOSIS — K219 Gastro-esophageal reflux disease without esophagitis: Secondary | ICD-10-CM | POA: Diagnosis present

## 2022-05-24 DIAGNOSIS — Z79899 Other long term (current) drug therapy: Secondary | ICD-10-CM

## 2022-05-24 DIAGNOSIS — E7800 Pure hypercholesterolemia, unspecified: Secondary | ICD-10-CM | POA: Diagnosis present

## 2022-05-24 DIAGNOSIS — N179 Acute kidney failure, unspecified: Secondary | ICD-10-CM | POA: Diagnosis present

## 2022-05-24 DIAGNOSIS — N201 Calculus of ureter: Secondary | ICD-10-CM | POA: Diagnosis not present

## 2022-05-24 DIAGNOSIS — Z96641 Presence of right artificial hip joint: Secondary | ICD-10-CM | POA: Diagnosis present

## 2022-05-24 DIAGNOSIS — Z8701 Personal history of pneumonia (recurrent): Secondary | ICD-10-CM

## 2022-05-24 DIAGNOSIS — N83201 Unspecified ovarian cyst, right side: Secondary | ICD-10-CM | POA: Diagnosis present

## 2022-05-24 DIAGNOSIS — N136 Pyonephrosis: Secondary | ICD-10-CM | POA: Diagnosis present

## 2022-05-24 DIAGNOSIS — Z148 Genetic carrier of other disease: Secondary | ICD-10-CM | POA: Diagnosis not present

## 2022-05-24 DIAGNOSIS — B964 Proteus (mirabilis) (morganii) as the cause of diseases classified elsewhere: Secondary | ICD-10-CM | POA: Diagnosis present

## 2022-05-24 DIAGNOSIS — K5730 Diverticulosis of large intestine without perforation or abscess without bleeding: Secondary | ICD-10-CM | POA: Diagnosis present

## 2022-05-24 HISTORY — PX: CYSTOSCOPY WITH STENT PLACEMENT: SHX5790

## 2022-05-24 HISTORY — DX: Sepsis, unspecified organism: A41.9

## 2022-05-24 LAB — CBC WITH DIFFERENTIAL/PLATELET
Abs Immature Granulocytes: 0.11 10*3/uL — ABNORMAL HIGH (ref 0.00–0.07)
Basophils Absolute: 0 10*3/uL (ref 0.0–0.1)
Basophils Relative: 0 %
Eosinophils Absolute: 0 10*3/uL (ref 0.0–0.5)
Eosinophils Relative: 0 %
HCT: 38.6 % (ref 36.0–46.0)
Hemoglobin: 13.1 g/dL (ref 12.0–15.0)
Immature Granulocytes: 1 %
Lymphocytes Relative: 4 %
Lymphs Abs: 0.7 10*3/uL (ref 0.7–4.0)
MCH: 30.1 pg (ref 26.0–34.0)
MCHC: 33.9 g/dL (ref 30.0–36.0)
MCV: 88.7 fL (ref 80.0–100.0)
Monocytes Absolute: 1.1 10*3/uL — ABNORMAL HIGH (ref 0.1–1.0)
Monocytes Relative: 7 %
Neutro Abs: 13.6 10*3/uL — ABNORMAL HIGH (ref 1.7–7.7)
Neutrophils Relative %: 88 %
Platelets: 295 10*3/uL (ref 150–400)
RBC: 4.35 MIL/uL (ref 3.87–5.11)
RDW: 12.6 % (ref 11.5–15.5)
WBC: 15.6 10*3/uL — ABNORMAL HIGH (ref 4.0–10.5)
nRBC: 0 % (ref 0.0–0.2)

## 2022-05-24 LAB — URINALYSIS, ROUTINE W REFLEX MICROSCOPIC
Bilirubin Urine: NEGATIVE
Glucose, UA: NEGATIVE mg/dL
Ketones, ur: NEGATIVE mg/dL
Nitrite: NEGATIVE
Protein, ur: 30 mg/dL — AB
RBC / HPF: >50 RBC/hpf (ref 0–5)
Specific Gravity, Urine: 1.009 (ref 1.005–1.030)
Squamous Epithelial / HPF: NONE SEEN /HPF (ref 0–5)
WBC, UA: >50 WBC/hpf (ref 0–5)
pH: 6 (ref 5.0–8.0)

## 2022-05-24 LAB — COMPREHENSIVE METABOLIC PANEL
ALT: 18 U/L (ref 0–44)
AST: 34 U/L (ref 15–41)
Albumin: 3.4 g/dL — ABNORMAL LOW (ref 3.5–5.0)
Alkaline Phosphatase: 74 U/L (ref 38–126)
Anion gap: 11 (ref 5–15)
BUN: 21 mg/dL (ref 8–23)
CO2: 21 mmol/L — ABNORMAL LOW (ref 22–32)
Calcium: 8.8 mg/dL — ABNORMAL LOW (ref 8.9–10.3)
Chloride: 101 mmol/L (ref 98–111)
Creatinine, Ser: 1.51 mg/dL — ABNORMAL HIGH (ref 0.44–1.00)
GFR, Estimated: 37 mL/min — ABNORMAL LOW (ref >60)
Glucose, Bld: 148 mg/dL — ABNORMAL HIGH (ref 70–99)
Potassium: 2.9 mmol/L — ABNORMAL LOW (ref 3.5–5.1)
Sodium: 133 mmol/L — ABNORMAL LOW (ref 135–145)
Total Bilirubin: 0.8 mg/dL (ref 0.3–1.2)
Total Protein: 6.9 g/dL (ref 6.5–8.1)

## 2022-05-24 LAB — URINALYSIS, COMPLETE (UACMP) WITH MICROSCOPIC
Glucose, UA: NEGATIVE mg/dL
Nitrite: POSITIVE — AB
Protein, ur: >300 mg/dL — AB
RBC / HPF: >50 RBC/hpf (ref 0–5)
Specific Gravity, Urine: 1.015 (ref 1.005–1.030)
WBC, UA: >50 WBC/hpf (ref 0–5)
pH: 6.5 (ref 5.0–8.0)

## 2022-05-24 LAB — CBG MONITORING, ED: Glucose-Capillary: 117 mg/dL — ABNORMAL HIGH (ref 70–99)

## 2022-05-24 LAB — CBC
HCT: 33.6 % — ABNORMAL LOW (ref 36.0–46.0)
Hemoglobin: 11.4 g/dL — ABNORMAL LOW (ref 12.0–15.0)
MCH: 30 pg (ref 26.0–34.0)
MCHC: 33.9 g/dL (ref 30.0–36.0)
MCV: 88.4 fL (ref 80.0–100.0)
Platelets: 254 10*3/uL (ref 150–400)
RBC: 3.8 MIL/uL — ABNORMAL LOW (ref 3.87–5.11)
RDW: 12.9 % (ref 11.5–15.5)
WBC: 13.8 10*3/uL — ABNORMAL HIGH (ref 4.0–10.5)
nRBC: 0 % (ref 0.0–0.2)

## 2022-05-24 LAB — LACTIC ACID, PLASMA
Lactic Acid, Venous: 1.7 mmol/L (ref 0.5–1.9)
Lactic Acid, Venous: 2.2 mmol/L (ref 0.5–1.9)

## 2022-05-24 LAB — CREATININE, SERUM
Creatinine, Ser: 1.32 mg/dL — ABNORMAL HIGH (ref 0.44–1.00)
GFR, Estimated: 44 mL/min — ABNORMAL LOW (ref >60)

## 2022-05-24 LAB — GLUCOSE, CAPILLARY: Glucose-Capillary: 183 mg/dL — ABNORMAL HIGH (ref 70–99)

## 2022-05-24 SURGERY — CYSTOSCOPY, WITH STENT INSERTION
Anesthesia: General | Laterality: Left

## 2022-05-24 MED ORDER — PROPOFOL 500 MG/50ML IV EMUL
INTRAVENOUS | Status: DC | PRN
  Administered 2022-05-24: 100 ug/kg/min via INTRAVENOUS

## 2022-05-24 MED ORDER — POTASSIUM CHLORIDE 2 MEQ/ML IV SOLN
INTRAVENOUS | Status: DC
  Filled 2022-05-24 (×5): qty 1000

## 2022-05-24 MED ORDER — SODIUM CHLORIDE 0.9 % IV BOLUS
1000.0000 mL | Freq: Once | INTRAVENOUS | Status: AC
  Administered 2022-05-24: 1000 mL via INTRAVENOUS

## 2022-05-24 MED ORDER — PROPRANOLOL HCL ER 60 MG PO CP24
60.0000 mg | ORAL_CAPSULE | Freq: Every day | ORAL | Status: DC
  Administered 2022-05-25 – 2022-05-26 (×2): 60 mg via ORAL
  Filled 2022-05-24 (×2): qty 1

## 2022-05-24 MED ORDER — IOHEXOL 180 MG/ML  SOLN
INTRAMUSCULAR | Status: DC | PRN
  Administered 2022-05-24: 10 mL

## 2022-05-24 MED ORDER — ONDANSETRON HCL 4 MG/2ML IJ SOLN: 4.0000 mg | Freq: Four times a day (QID) | INTRAMUSCULAR | Status: DC | PRN

## 2022-05-24 MED ORDER — SODIUM CHLORIDE 0.9 % IR SOLN
Status: DC | PRN
  Administered 2022-05-24: 100 mL

## 2022-05-24 MED ORDER — SODIUM CHLORIDE 0.9 % IV BOLUS
500.0000 mL | Freq: Once | INTRAVENOUS | Status: AC
  Administered 2022-05-24: 500 mL via INTRAVENOUS

## 2022-05-24 MED ORDER — ONDANSETRON HCL 4 MG PO TABS: 4.0000 mg | ORAL_TABLET | Freq: Four times a day (QID) | ORAL | Status: DC | PRN

## 2022-05-24 MED ORDER — SUCCINYLCHOLINE CHLORIDE 200 MG/10ML IV SOSY
PREFILLED_SYRINGE | INTRAVENOUS | Status: DC | PRN
  Administered 2022-05-24: 100 mg via INTRAVENOUS

## 2022-05-24 MED ORDER — ACETAMINOPHEN 10 MG/ML IV SOLN
INTRAVENOUS | Status: AC
  Filled 2022-05-24: qty 100

## 2022-05-24 MED ORDER — OXYCODONE HCL 5 MG PO TABS: 5.0000 mg | ORAL_TABLET | Freq: Once | ORAL | Status: DC | PRN

## 2022-05-24 MED ORDER — ENOXAPARIN SODIUM 40 MG/0.4ML IJ SOSY
40.0000 mg | PREFILLED_SYRINGE | INTRAMUSCULAR | Status: DC
  Administered 2022-05-25: 40 mg via SUBCUTANEOUS
  Filled 2022-05-24: qty 0.4

## 2022-05-24 MED ORDER — INSULIN ASPART 100 UNIT/ML IJ SOLN: 0.0000 [IU] | Freq: Every day | INTRAMUSCULAR | Status: DC

## 2022-05-24 MED ORDER — INSULIN ASPART 100 UNIT/ML IJ SOLN
0.0000 [IU] | Freq: Three times a day (TID) | INTRAMUSCULAR | Status: DC
  Administered 2022-05-25 (×2): 3 [IU] via SUBCUTANEOUS
  Administered 2022-05-26: 2 [IU] via SUBCUTANEOUS
  Filled 2022-05-24 (×3): qty 1

## 2022-05-24 MED ORDER — ONDANSETRON HCL 4 MG/2ML IJ SOLN
INTRAMUSCULAR | Status: DC | PRN
  Administered 2022-05-24: 4 mg via INTRAVENOUS

## 2022-05-24 MED ORDER — TRAZODONE HCL 50 MG PO TABS
50.0000 mg | ORAL_TABLET | Freq: Every day | ORAL | Status: DC
  Administered 2022-05-25: 50 mg via ORAL
  Filled 2022-05-24 (×2): qty 1

## 2022-05-24 MED ORDER — FENTANYL CITRATE (PF) 100 MCG/2ML IJ SOLN
INTRAMUSCULAR | Status: AC
  Filled 2022-05-24: qty 2

## 2022-05-24 MED ORDER — DEXAMETHASONE SODIUM PHOSPHATE 10 MG/ML IJ SOLN
INTRAMUSCULAR | Status: DC | PRN
  Administered 2022-05-24: 6 mg via INTRAVENOUS

## 2022-05-24 MED ORDER — POTASSIUM CHLORIDE 10 MEQ/100ML IV SOLN
10.0000 meq | Freq: Once | INTRAVENOUS | Status: AC
  Administered 2022-05-24: 10 meq via INTRAVENOUS
  Filled 2022-05-24: qty 100

## 2022-05-24 MED ORDER — SUCCINYLCHOLINE CHLORIDE 200 MG/10ML IV SOSY
PREFILLED_SYRINGE | INTRAVENOUS | Status: AC
  Filled 2022-05-24: qty 10

## 2022-05-24 MED ORDER — OXYCODONE HCL 5 MG/5ML PO SOLN: 5.0000 mg | Freq: Once | ORAL | Status: DC | PRN

## 2022-05-24 MED ORDER — SODIUM CHLORIDE 0.9 % IV SOLN
2.0000 g | Freq: Once | INTRAVENOUS | Status: AC
  Administered 2022-05-24: 2 g via INTRAVENOUS
  Filled 2022-05-24: qty 20

## 2022-05-24 MED ORDER — MORPHINE SULFATE (PF) 2 MG/ML IV SOLN: 2.0000 mg | INTRAVENOUS | Status: DC | PRN

## 2022-05-24 MED ORDER — PROPOFOL 10 MG/ML IV BOLUS
INTRAVENOUS | Status: AC
  Filled 2022-05-24: qty 40

## 2022-05-24 MED ORDER — PROPOFOL 1000 MG/100ML IV EMUL
INTRAVENOUS | Status: AC
  Filled 2022-05-24: qty 100

## 2022-05-24 MED ORDER — LIDOCAINE HCL (PF) 2 % IJ SOLN
INTRAMUSCULAR | Status: AC
  Filled 2022-05-24: qty 5

## 2022-05-24 MED ORDER — DEXAMETHASONE SODIUM PHOSPHATE 10 MG/ML IJ SOLN
INTRAMUSCULAR | Status: AC
  Filled 2022-05-24: qty 1

## 2022-05-24 MED ORDER — ROSUVASTATIN CALCIUM 10 MG PO TABS
5.0000 mg | ORAL_TABLET | Freq: Every day | ORAL | Status: DC
  Administered 2022-05-25: 5 mg via ORAL
  Filled 2022-05-24 (×2): qty 1

## 2022-05-24 MED ORDER — FENTANYL CITRATE (PF) 100 MCG/2ML IJ SOLN
INTRAMUSCULAR | Status: DC | PRN
  Administered 2022-05-24 (×2): 50 ug via INTRAVENOUS

## 2022-05-24 MED ORDER — SODIUM CHLORIDE 0.9 % IV SOLN
2.0000 g | INTRAVENOUS | Status: DC
  Administered 2022-05-25: 2 g via INTRAVENOUS
  Filled 2022-05-24 (×2): qty 20

## 2022-05-24 MED ORDER — PANTOPRAZOLE SODIUM 40 MG PO TBEC
40.0000 mg | DELAYED_RELEASE_TABLET | Freq: Every day | ORAL | Status: DC
  Administered 2022-05-24 – 2022-05-26 (×3): 40 mg via ORAL
  Filled 2022-05-24 (×3): qty 1

## 2022-05-24 MED ORDER — LACTATED RINGERS IV SOLN: INTRAVENOUS | Status: DC | PRN

## 2022-05-24 MED ORDER — LIDOCAINE HCL (CARDIAC) PF 100 MG/5ML IV SOSY
PREFILLED_SYRINGE | INTRAVENOUS | Status: DC | PRN
  Administered 2022-05-24: 80 mg via INTRAVENOUS

## 2022-05-24 MED ORDER — POTASSIUM CHLORIDE 10 MEQ/100ML IV SOLN
10.0000 meq | INTRAVENOUS | Status: AC
  Administered 2022-05-24: 10 meq via INTRAVENOUS
  Filled 2022-05-24: qty 100

## 2022-05-24 MED ORDER — PHENYLEPHRINE HCL (PRESSORS) 10 MG/ML IV SOLN
INTRAVENOUS | Status: DC | PRN
  Administered 2022-05-24 (×2): 80 ug via INTRAVENOUS

## 2022-05-24 MED ORDER — PROPOFOL 10 MG/ML IV BOLUS
INTRAVENOUS | Status: DC | PRN
  Administered 2022-05-24: 120 mg via INTRAVENOUS

## 2022-05-24 MED ORDER — ONDANSETRON HCL 4 MG/2ML IJ SOLN
INTRAMUSCULAR | Status: AC
  Filled 2022-05-24: qty 2

## 2022-05-24 MED ORDER — DROPERIDOL 2.5 MG/ML IJ SOLN: 0.6250 mg | Freq: Once | INTRAMUSCULAR | Status: DC | PRN

## 2022-05-24 MED ORDER — DULOXETINE HCL 30 MG PO CPEP
60.0000 mg | ORAL_CAPSULE | Freq: Every day | ORAL | Status: DC
  Administered 2022-05-25 – 2022-05-26 (×2): 60 mg via ORAL
  Filled 2022-05-24 (×3): qty 2
  Filled 2022-05-24: qty 1

## 2022-05-24 MED ORDER — FENTANYL CITRATE (PF) 100 MCG/2ML IJ SOLN: 25.0000 ug | INTRAMUSCULAR | Status: DC | PRN

## 2022-05-24 SURGICAL SUPPLY — 20 items
BAG DRAIN SIEMENS DORNER NS (MISCELLANEOUS) ×1 IMPLANT
BRUSH SCRUB EZ 1% IODOPHOR (MISCELLANEOUS) ×1 IMPLANT
CATH URETL OPEN 5X70 (CATHETERS) ×1 IMPLANT
GAUZE 4X4 16PLY ~~LOC~~+RFID DBL (SPONGE) ×2 IMPLANT
GLOVE BIO SURGEON STRL SZ 6.5 (GLOVE) ×1 IMPLANT
GOWN STRL REUS W/ TWL LRG LVL3 (GOWN DISPOSABLE) ×2 IMPLANT
GOWN STRL REUS W/TWL LRG LVL3 (GOWN DISPOSABLE) ×2
GUIDEWIRE STR DUAL SENSOR (WIRE) ×1 IMPLANT
IV NS IRRIG 3000ML ARTHROMATIC (IV SOLUTION) ×1 IMPLANT
KIT TURNOVER CYSTO (KITS) ×1 IMPLANT
PACK CYSTO AR (MISCELLANEOUS) ×1 IMPLANT
SET CYSTO W/LG BORE CLAMP LF (SET/KITS/TRAYS/PACK) ×1 IMPLANT
STENT URET 6FRX24 CONTOUR (STENTS) IMPLANT
STENT URET 6FRX26 CONTOUR (STENTS) IMPLANT
SURGILUBE 2OZ TUBE FLIPTOP (MISCELLANEOUS) ×1 IMPLANT
SYR TOOMEY IRRIG 70ML (MISCELLANEOUS) ×1
SYRINGE TOOMEY IRRIG 70ML (MISCELLANEOUS) ×1 IMPLANT
TRAP FLUID SMOKE EVACUATOR (MISCELLANEOUS) ×1 IMPLANT
WATER STERILE IRR 1000ML POUR (IV SOLUTION) ×1 IMPLANT
WATER STERILE IRR 500ML POUR (IV SOLUTION) ×1 IMPLANT

## 2022-05-24 NOTE — H&P (Signed)
History and Physical    Patient: Sheryl [name] [id number] DOB: December 16, 1950 DOA: 05/24/2022 DOS: the patient was seen and examined on 05/24/2022 PCP: Latanya Maudlin, NP  Patient coming from: Home  Chief Complaint:  Chief Complaint  Patient presents with   Flank Pain   HPI: Sheryl [name] is a 72 y.o. female with medical history significant of diabetes, essential hypertension, chronic kidney disease stage III, GERD, hyperlipidemia, recurrent nephrolithiasis who presented to the ER with left lower quadrant pain that has been going on since since Friday.  Patient also has loss of appetite nausea and fever.  Patient also has some dysuria but no hematuria.  She was seen by urology early in the morning.  Was sent to the ER.  Had a stat CT scan in the office prior to return.  The CT showed left-sided hydronephrosis, and 3 mm stone.  Urinalysis indicated UTI.  Patient also meets sepsis criteria with a temperature lactic acidosis and leukocytosis.  Patient is being taken to the OR by urology and will be admitting the patient for IV antibiotics treatment.  Review of Systems: As mentioned in the history of present illness. All other systems reviewed and are negative. Past Medical History:  Diagnosis Date   Anesthesia complication    bradycardia   Anxiety    Arthritis    lower back, left hip    Chicken pox    Chronic kidney disease    Depression    Diabetes mellitus without complication (HCC)    GERD (gastroesophageal reflux disease)    Hemochromatosis carrier    History of hiatal hernia    History of kidney stones    Hypercholesterolemia    Hypertension    Migraines    rare now   Nephrolithiasis    PONV (postoperative nausea and vomiting)    VOMITED A LITTLE BIT AFTER KIDNEY STONE SURGERY   Past Surgical History:  Procedure Laterality Date   BREAST CYST ASPIRATION Left    negative 2012   CHOLECYSTECTOMY     COLONOSCOPY     COLONOSCOPY WITH PROPOFOL N/A 12/25/2020   Procedure:  COLONOSCOPY WITH PROPOFOL;  Surgeon: Lucilla Lame, MD;  Location: [facility] ENDOSCOPY;  Service: Endoscopy;  Laterality: N/A;   COLONOSCOPY, ESOPHAGOGASTRODUODENOSCOPY (EGD) AND ESOPHAGEAL DILATION     CYSTOSCOPY W/ RETROGRADES Bilateral 12/04/2018   Procedure: CYSTOSCOPY WITH RETROGRADE PYELOGRAM;  Surgeon: Hollice Espy, MD;  Location: [facility]S;  Service: Urology;  Laterality: Bilateral;   CYSTOSCOPY/URETEROSCOPY/HOLMIUM LASER/STENT PLACEMENT Left 06/26/2018   Procedure: CYSTOSCOPY/URETEROSCOPY/HOLMIUM LASER/STENT Exchange;  Surgeon: Hollice Espy, MD;  Location: [facility]S;  Service: Urology;  Laterality: Left;   CYSTOSCOPY/URETEROSCOPY/HOLMIUM LASER/STENT PLACEMENT Right 12/04/2018   Procedure: CYSTOSCOPY/URETEROSCOPY/HOLMIUM LASER/STENT PLACEMENT;  Surgeon: Hollice Espy, MD;  Location: [facility];  Service: Urology;  Laterality: Right;   DISTAL BICEPS TENDON REPAIR Right 01/19/2017   Procedure: BICEPS TENODESIS;  Surgeon: Corky Mull, MD;  Location: [place];  Service: Orthopedics;  Laterality: Right;  Diabetic - oral meds   ESOPHAGOGASTRODUODENOSCOPY (EGD) WITH PROPOFOL N/A 01/31/2015   Procedure: ESOPHAGOGASTRODUODENOSCOPY (EGD) WITH PROPOFOL;  Surgeon: Manya Silvas, MD;  Location: [facility] ENDOSCOPY;  Service: Endoscopy;  Laterality: N/A;   ESOPHAGOGASTRODUODENOSCOPY (EGD) WITH PROPOFOL N/A 06/19/2020   Procedure: ESOPHAGOGASTRODUODENOSCOPY (EGD) WITH PROPOFOL;  Surgeon: Lucilla Lame, MD;  Location: [facility] ENDOSCOPY;  Service: Endoscopy;  Laterality: N/A;   ESOPHAGOGASTRODUODENOSCOPY (EGD) WITH PROPOFOL N/A 12/25/2020   Procedure: ESOPHAGOGASTRODUODENOSCOPY (EGD) WITH PROPOFOL;  Surgeon: Lucilla Lame, MD;  Location: [facility] ENDOSCOPY;  Service: Endoscopy;  Laterality: N/A;   ESOPHAGOGASTRODUODENOSCOPY (EGD) WITH PROPOFOL N/A 12/22/2021   Procedure: ESOPHAGOGASTRODUODENOSCOPY (EGD) WITH PROPOFOL;  Surgeon: Lucilla Lame, MD;  Location: [facility] ENDOSCOPY;  Service: Endoscopy;  Laterality: N/A;    ESOPHAGOGASTRODUODENOSCOPY (EGD) WITH PROPOFOL N/A 01/28/2022   Procedure: ESOPHAGOGASTRODUODENOSCOPY (EGD) WITH PROPOFOL;  Surgeon: Lucilla Lame, MD;  Location: [facility] ENDOSCOPY;  Service: Endoscopy;  Laterality: N/A;   esophogastroduodenoscopy     esophogastroduodeoscopy     HERNIA REPAIR     IR NEPHROSTOMY PLACEMENT LEFT  05/22/2018   NEPHROLITHOTOMY Left 05/22/2018   Procedure: NEPHROLITHOTOMY PERCUTANEOUS;  Surgeon: Hollice Espy, MD;  Location: [facility] ORS;  Service: Urology;  Laterality: Left;   SAVORY DILATION N/A 01/31/2015   Procedure: SAVORY DILATION;  Surgeon: Manya Silvas, MD;  Location: [facility] ENDOSCOPY;  Service: Endoscopy;  Laterality: N/A;   SHOULDER ARTHROSCOPY Right 01/19/2017   Procedure: ARTHROSCOPY SHOULDER DEBRIDEMENT DECOMPRESSION AND  ROTATOR CUFF REPAIR AND BICEPS TENODESIS;  Surgeon: Corky Mull, MD;  Location: [place];  Service: Orthopedics;  Laterality: Right;   TOTAL HIP ARTHROPLASTY Right 03/18/2022   Procedure: Right posterior total hip arthroplasty;  Surgeon: Steffanie Rainwater, MD;  Location: [facility] ORS;  Service: Orthopedics;  Laterality: Right;   Social History:  reports that she quit smoking about 14 years ago. Her smoking use included cigarettes. She has a 37.50 pack-year smoking history. She has never used smokeless tobacco. She reports that she does not drink alcohol and does not use drugs.  No Known Allergies  No family history on file.  Prior to Admission medications   Medication Sig Start Date End Date Taking? Authorizing Provider  acetaminophen (TYLENOL) 500 MG tablet Take 2 tablets (1,000 mg total) by mouth every 8 (eight) hours. 03/19/22   Duanne Guess, PA-C  buPROPion (WELLBUTRIN XL) 300 MG 24 hr tablet Take 300 mg by mouth daily.    [provider]  Cholecalciferol (VITAMIN D3) 50 MCG (2000 UT) capsule Take 2,000 Units by mouth daily.    [provider]  DULoxetine (CYMBALTA) 60 MG capsule Take 60 mg by mouth  daily.    [provider]  enoxaparin (LOVENOX) 40 MG/0.4ML injection Inject 0.4 mLs (40 mg total) into the skin daily for 14 days. 03/19/22 04/02/22  Duanne Guess, PA-C  lisinopril-hydrochlorothiazide (ZESTORETIC) 20-25 MG tablet Take 0.5 tablets by mouth daily. 12/14/19 03/18/22  [provider]  meloxicam (MOBIC) 15 MG tablet Take by mouth. 04/12/22   [provider]  metFORMIN (GLUCOPHAGE) 500 MG tablet Take 500 mg by mouth 2 (two) times daily with a meal.    [provider]  pantoprazole (PROTONIX) 40 MG tablet Take 40 mg by mouth daily. 11/03/21   [provider]  potassium chloride (KLOR-CON) 8 MEQ tablet 8 mEq daily. 01/01/20   [provider]  propranolol ER (INDERAL LA) 60 MG 24 hr capsule Take 60 mg by mouth daily. 02/08/22 02/08/23  [provider]  rosuvastatin (CRESTOR) 5 MG tablet Take 5 mg by mouth at bedtime.     [provider]  traMADol (ULTRAM) 50 MG tablet Take 1 tablet (50 mg total) by mouth every 6 (six) hours as needed for moderate pain. 03/19/22   Duanne Guess, PA-C  traZODone (DESYREL) 50 MG tablet Take 50 mg by mouth at bedtime. 01/02/22   [provider]  vitamin B-12 (CYANOCOBALAMIN) 1000 MCG tablet Take 1,000 mcg by mouth daily.    [provider]    Physical Exam: Vitals:   05/24/22  1718 05/24/22 1828 05/24/22 1951  BP: (!) 152/79 125/67 109/85  Pulse: (!) 131 (!) 114 (!) 107  Resp: 18 16 18  $ Temp: 100.1 F (37.8 C) 99.8 F (37.7 C)   TempSrc: Oral Oral   SpO2: 94% 95% 100%   Constitutional: Acutely ill looking, NAD, calm, comfortable Eyes: PERRL, lids and conjunctivae normal ENMT: Mucous membranes are dry. Posterior pharynx clear of any exudate or lesions.Normal dentition.  Neck: normal, supple, no masses, no thyromegaly Respiratory: clear to auscultation bilaterally, no wheezing, no crackles. Normal respiratory effort. No accessory muscle use.  Cardiovascular: Sinus  tachycardia, no murmurs / rubs / gallops. No extremity edema. 2+ pedal pulses. No carotid bruits.  Abdomen: no tenderness, no masses palpated. No hepatosplenomegaly. Bowel sounds positive.  Musculoskeletal: Good range of motion, no joint swelling or tenderness, Skin: no rashes, lesions, ulcers. No induration Neurologic: CN 2-12 grossly intact. Sensation intact, DTR normal. Strength 5/5 in all 4.  Psychiatric: Normal judgment and insight. Alert and oriented x 3. Normal mood  Data Reviewed:  Temperature 100.1, blood pressure 152/79, pulse 131, respiratory 25 oxygen sat 94% on room air.  White count 15.6, sodium 133, potassium 2.9, chloride 101 CO2 21 and creatinine 1.51.  Glucose 148.  Lactic acid 2.2.  Urinalysis showed cloudy urine positive nitrite and large leukocyte esterase.  WBC more than 50.  Chest x-ray showed no active disease.  CT renal stone showed left sided perinephric and periureteric stranding with hydro ureteral nephrosis.  Also 3 mm stone in the distal left ureter.  Additional bilateral renal calculi measuring up to 10 mm in the left renal pelvis.  Assessment and Plan:  #1 sepsis due to infected urinary stone: Patient will be taken to the OR by urology.  In the meantime we will admit the patient for sepsis workup and treatment.  IV Rocephin.  Ringer's lactate.  Aggressive fluid hydration and follow urine and blood culture results.  Serial lactate will be ordered.  #2 infected nephrolithiasis: Defer to urology.  #3 diabetes type 2: Non-insulin-dependent.  Initiate sliding scale insulin and monitor.  #4 essential hypertension: Continue home regimen.  #5 hypokalemia: Most likely from nausea and some vomiting.  Continue to replete potassium.  #6 hyperlipidemia: Will resume home statin.  #7 depression and anxiety: On trazodone and Cymbalta.  We will continue     Advance Care Planning:   Code Status: Full Code   Consults: Urology Dr. Erlene Quan  Family Communication: No family  at bedside  Severity of Illness: The appropriate patient status for this patient is INPATIENT. Inpatient status is judged to be reasonable and necessary in order to provide the required intensity of service to ensure the patient's safety. The patient's presenting symptoms, physical exam findings, and initial radiographic and laboratory data in the context of their chronic comorbidities is felt to place them at high risk for further clinical deterioration. Furthermore, it is not anticipated that the patient will be medically stable for discharge from the hospital within 2 midnights of admission.   * I certify that at the point of admission it is my clinical judgment that the patient will require inpatient hospital care spanning beyond 2 midnights from the point of admission due to high intensity of service, high risk for further deterioration and high frequency of surveillance required.*  AuthorBarbette Merino, MD 05/24/2022 8:29 PM  For on call review [email or web address].

## 2022-05-24 NOTE — Sepsis Progress Note (Signed)
Notified provider and bedside nurse of need to order repeat lactic acid.

## 2022-05-24 NOTE — Anesthesia Procedure Notes (Signed)
Procedure Name: Intubation Date/Time: 05/24/2022 8:42 PM  Performed by: Demetrius Charity, CRNAPre-anesthesia Checklist: Patient identified, Patient being monitored, Timeout performed, Emergency Drugs available and Suction available Patient Re-evaluated:Patient Re-evaluated prior to induction Oxygen Delivery Method: Circle system utilized Preoxygenation: Pre-oxygenation with 100% oxygen Induction Type: IV induction Ventilation: Mask ventilation without difficulty Laryngoscope Size: McGraph and 4 Grade View: Grade II Tube type: Oral Tube size: 6.5 mm Number of attempts: 1 Airway Equipment and Method: Stylet Placement Confirmation: ETT inserted through vocal cords under direct vision, positive ETCO2 and breath sounds checked- equal and bilateral Secured at: 21 cm Tube secured with: Tape Dental Injury: Teeth and Oropharynx as per pre-operative assessment

## 2022-05-24 NOTE — Anesthesia Preprocedure Evaluation (Signed)
Anesthesia Evaluation  Patient identified by MRN, date of birth, ID band Patient awake    Reviewed: Allergy & Precautions, NPO status , Patient's Chart, lab work & pertinent test results  History of Anesthesia Complications (+) PONV and history of anesthetic complications  Airway Mallampati: III  TM Distance: <3 FB Neck ROM: full    Dental  (+) Chipped, Poor Dentition, Missing   Pulmonary neg shortness of breath, neg COPD, former smoker   Pulmonary exam normal        Cardiovascular Exercise Tolerance: Good hypertension, (-) angina (-) Past MI and (-) DOE Normal cardiovascular exam     Neuro/Psych  Headaches PSYCHIATRIC DISORDERS         GI/Hepatic Neg liver ROS, hiatal hernia,GERD  Controlled,,  Endo/Other  diabetes, Type 2    Renal/GU Renal disease     Musculoskeletal   Abdominal   Peds  Hematology negative hematology ROS (+)   Anesthesia Other Findings Past Medical History: No date: Anesthesia complication     Comment:  bradycardia No date: Anxiety No date: Arthritis     Comment:  lower back, left hip  No date: Chicken pox No date: Chronic kidney disease No date: Depression No date: Diabetes mellitus without complication (HCC) No date: GERD (gastroesophageal reflux disease) 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 No date: History of hiatal hernia No date: History of kidney stones No date: Hypercholesterolemia No date: Hypertension No date: Migraines     Comment:  rare now No date: Nephrolithiasis No date: PONV (postoperative nausea and vomiting)     Comment:  VOMITED A LITTLE BIT AFTER KIDNEY STONE SURGERY  Past Surgical History: No date: BREAST CYST ASPIRATION; Left     Comment:  negative 2012 No date: CHOLECYSTECTOMY No date: COLONOSCOPY 12/25/2020: COLONOSCOPY WITH PROPOFOL; N/A     Comment:  Procedure: COLONOSCOPY WITH PROPOFOL;  Surgeon: Lucilla Lame, MD;  Location: [facility] ENDOSCOPY;   Service:               Endoscopy;  Laterality: N/A; No date: COLONOSCOPY, ESOPHAGOGASTRODUODENOSCOPY (EGD) AND ESOPHAGEAL  DILATION 12/04/2018: CYSTOSCOPY W/ RETROGRADES; Bilateral     Comment:  Procedure: CYSTOSCOPY WITH RETROGRADE PYELOGRAM;                Surgeon: Hollice Espy, MD;  Location: [facility] ORS;                Service: Urology;  Laterality: Bilateral; 06/26/2018: CYSTOSCOPY/URETEROSCOPY/HOLMIUM LASER/STENT PLACEMENT; Left     Comment:  Procedure: CYSTOSCOPY/URETEROSCOPY/HOLMIUM LASER/STENT               Exchange;  Surgeon: Hollice Espy, MD;  Location: [facility]               ORS;  Service: Urology;  Laterality: Left; 12/04/2018: CYSTOSCOPY/URETEROSCOPY/HOLMIUM LASER/STENT PLACEMENT;  Right     Comment:  Procedure: CYSTOSCOPY/URETEROSCOPY/HOLMIUM LASER/STENT               PLACEMENT;  Surgeon: Hollice Espy, MD;  Location: [facility]              ORS;  Service: Urology;  Laterality: Right; 01/19/2017: DISTAL BICEPS TENDON REPAIR; Right     Comment:  Procedure: BICEPS TENODESIS;  Surgeon: Corky Mull,               MD;  Location: [place];  Service:  Orthopedics;  Laterality: Right;  Diabetic - oral meds 01/31/2015: ESOPHAGOGASTRODUODENOSCOPY (EGD) WITH PROPOFOL; N/A     Comment:  Procedure: ESOPHAGOGASTRODUODENOSCOPY (EGD) WITH               PROPOFOL;  Surgeon: Manya Silvas, MD;  Location: [facility]              ENDOSCOPY;  Service: Endoscopy;  Laterality: N/A; 06/19/2020: ESOPHAGOGASTRODUODENOSCOPY (EGD) WITH PROPOFOL; N/A     Comment:  Procedure: ESOPHAGOGASTRODUODENOSCOPY (EGD) WITH               PROPOFOL;  Surgeon: Lucilla Lame, MD;  Location: [facility]               ENDOSCOPY;  Service: Endoscopy;  Laterality: N/A; 12/25/2020: ESOPHAGOGASTRODUODENOSCOPY (EGD) WITH PROPOFOL; N/A     Comment:  Procedure: ESOPHAGOGASTRODUODENOSCOPY (EGD) WITH               PROPOFOL;  Surgeon: Lucilla Lame, MD;  Location: [facility]               ENDOSCOPY;  Service: Endoscopy;   Laterality: N/A; 12/22/2021: ESOPHAGOGASTRODUODENOSCOPY (EGD) WITH PROPOFOL; N/A     Comment:  Procedure: ESOPHAGOGASTRODUODENOSCOPY (EGD) WITH               PROPOFOL;  Surgeon: Lucilla Lame, MD;  Location: [facility]               ENDOSCOPY;  Service: Endoscopy;  Laterality: N/A; 01/28/2022: ESOPHAGOGASTRODUODENOSCOPY (EGD) WITH PROPOFOL; N/A     Comment:  Procedure: ESOPHAGOGASTRODUODENOSCOPY (EGD) WITH               PROPOFOL;  Surgeon: Lucilla Lame, MD;  Location: [facility]               ENDOSCOPY;  Service: Endoscopy;  Laterality: N/A; No date: esophogastroduodenoscopy No date: esophogastroduodeoscopy No date: HERNIA REPAIR 05/22/2018: IR NEPHROSTOMY PLACEMENT LEFT 05/22/2018: NEPHROLITHOTOMY; Left     Comment:  Procedure: NEPHROLITHOTOMY PERCUTANEOUS;  Surgeon:               Hollice Espy, MD;  Location: [facility] ORS;  Service:               Urology;  Laterality: Left; 01/31/2015: SAVORY DILATION; N/A     Comment:  Procedure: SAVORY DILATION;  Surgeon: Manya Silvas,               MD;  Location: [facility] ENDOSCOPY;  Service: Endoscopy;                Laterality: N/A; 01/19/2017: SHOULDER ARTHROSCOPY; Right     Comment:  Procedure: ARTHROSCOPY SHOULDER DEBRIDEMENT               DECOMPRESSION AND  ROTATOR CUFF REPAIR AND BICEPS               TENODESIS;  Surgeon: Corky Mull, MD;  Location: [place];  Service: Orthopedics;  Laterality: Right; 03/18/2022: TOTAL HIP ARTHROPLASTY; Right     Comment:  Procedure: Right posterior total hip arthroplasty;                Surgeon: Steffanie Rainwater, MD;  Location: [facility] ORS;                Service: Orthopedics;  Laterality: Right;     Reproductive/Obstetrics negative OB ROS  Anesthesia Physical Anesthesia Plan  ASA: 3  Anesthesia Plan: General ETT   Post-op Pain Management:    Induction: Intravenous  PONV Risk Score and Plan: Ondansetron, Dexamethasone, Midazolam,  Treatment may vary due to age or medical condition and TIVA  Airway Management Planned: Oral ETT  Additional Equipment:   Intra-op Plan:   Post-operative Plan: Extubation in OR  Informed Consent: I have reviewed the patients History and Physical, chart, labs and discussed the procedure including the risks, benefits and alternatives for the proposed anesthesia with the patient or authorized representative who has indicated his/her understanding and acceptance.     Dental Advisory Given  Plan Discussed with: Anesthesiologist, CRNA and Surgeon  Anesthesia Plan Comments: (Patient consented for risks of anesthesia including but not limited to:  - adverse reactions to medications - damage to eyes, teeth, lips or other oral mucosa - nerve damage due to positioning  - sore throat or hoarseness - Damage to heart, brain, nerves, lungs, other parts of body or loss of life  Patient voiced understanding.)       Anesthesia Quick Evaluation

## 2022-05-24 NOTE — ED Notes (Signed)
One set of cultures were sent

## 2022-05-24 NOTE — Interval H&P Note (Signed)
History and Physical Interval Note:  05/24/2022 8:19 PM  Sheryl [name]  has presented today for surgery, with the diagnosis of ureteral stone left, sepsis.  The various methods of treatment have been discussed with the patient and family. After consideration of risks, benefits and other options for treatment, the patient has consented to  Procedure(s): [place] (Left) as a surgical intervention.  The patient's history has been reviewed, patient examined, no change in status, stable for surgery.  I have reviewed the patient's chart and labs.  Questions were answered to the patient's satisfaction.    RRR CTAB  Patient seen earlier today in clinic by Zara Council.  Stat CT indicated small left distal ureteral calculus as well as renal pelvic stone.  She went home, showered, and ultimately finally did present to the emergency room after several phone calls urging her for further evaluation.  In the emergency room, she was noted to be febrile to 100, persistently tachycardic, significant leukocytosis as well as frankly positive UA as well as acute kidney injury.  As such, if recommended emergent left ureteral stent placement.  She is agreeable this plan.  Risk and benefits discussed.  She will ultimately need definitive management of her stone.  She will be admitted to medicine for postoperative supportive care.  Hollice Espy

## 2022-05-24 NOTE — Anesthesia Postprocedure Evaluation (Signed)
Anesthesia Post Note  Patient: Sheryl [name]  Procedure(s) Performed: CYSTOSCOPY WITH STENT PLACEMENT (Left)  Patient location during evaluation: PACU Anesthesia Type: General Level of consciousness: awake and alert Pain management: pain level controlled Vital Signs Assessment: post-procedure vital signs reviewed and stable Respiratory status: spontaneous breathing, nonlabored ventilation, respiratory function stable and patient connected to nasal cannula oxygen Cardiovascular status: blood pressure returned to baseline and stable Postop Assessment: no apparent nausea or vomiting Anesthetic complications: no   No notable events documented.   Last Vitals:  Vitals:   05/24/22 2135 05/24/22 2152  BP:  118/60  Pulse: (!) 102 (!) 110  Resp: (!) 24 (!) 21  Temp:  36.7 C  SpO2: 98% 99%    Last Pain:  Vitals:   05/24/22 2130  TempSrc:   PainSc: 0-No pain                 Precious Haws Lateef Juncaj

## 2022-05-24 NOTE — ED Triage Notes (Signed)
Pt presents to the ED via POV due to kidney stones verified by STAT CT completed today. Pt states she was seen at [place] office today due to L flank pain, fevers and decrease appetite. Pt states she is having difficulty urinating at times. Pt denies NV. Pt A&Ox4

## 2022-05-24 NOTE — H&P (View-Only) (Signed)
05/24/2022 2:16 PM   Sheryl [name] December 04, 1950 [id number]  Referring provider: Latanya Maudlin, NP [address] [place],  [place] [id number]  Urological history: 1.  Nephrolithiasis -Stone composition 100% calcium oxalate monohydrate -123456 urine metabolic evaluation-very low urinary volume, borderline hyperoxaluria, low pH and low urinary calcium -right URS (11/2018) -left URS (06/2018) -left PCNL (05/2018)   Chief Complaint  Patient presents with   Nephrolithiasis    HPI: Sheryl [name] is a 72 y.o. female who presents today for last week one day had some pain in the lower left abdomen that went away but then this weekend had more intense pain and constant, fever off and on 102 highest.  She was treated for community-acquired pneumonia back in January.  She has been having left lower quadrant pain since Friday.  This has been associated with a decrease in appetite, nausea, fevers (102.0 or so) and chills.  She denies any pain in her flanks.  Patient denies any modifying or aggravating factors.  Patient denies any gross hematuria, dysuria or suprapubic.      KUB bilateral renal stone and a possible proximal calcification on the tip of L3.    PMH: Past Medical History:  Diagnosis Date   Anesthesia complication    bradycardia   Anxiety    Arthritis    lower back, left hip    Chicken pox    Chronic kidney disease    Depression    Diabetes mellitus without complication (HCC)    GERD (gastroesophageal reflux disease)    Hemochromatosis carrier    History of hiatal hernia    History of kidney stones    Hypercholesterolemia    Hypertension    Migraines    rare now   Nephrolithiasis    PONV (postoperative nausea and vomiting)    VOMITED A LITTLE BIT AFTER KIDNEY STONE SURGERY    Surgical History: Past Surgical History:  Procedure Laterality Date   BREAST CYST ASPIRATION Left    negative 2012   CHOLECYSTECTOMY     COLONOSCOPY     COLONOSCOPY WITH  PROPOFOL N/A 12/25/2020   Procedure: COLONOSCOPY WITH PROPOFOL;  Surgeon: Lucilla Lame, MD;  Location: [facility] END[facility]COPY;  Service: Endoscopy;  Laterality: N/A;   COLONOSCOPY, ESOPHAGOGASTRODUODENOSCOPY (EGD) AND ESOPHAGEAL DILATION     CYSTOSCOPY W/ RETROGRADES Bilateral 12/04/2018   Procedure: CYSTOSCOPY WITH RETROGRADE PYELOGRAM;  Surgeon: Hollice Espy, MD;  Location: [facility];  Service: Urology;  Laterality: Bilateral;   CYSTOSCOPY/URETEROSCOPY/HOLMIUM LASER/STENT PLACEMENT Left 06/26/2018   Procedure: CYSTOSCOPY/URETEROSCOPY/HOLMIUM LASER/STENT Exchange;  Surgeon: Hollice Espy, MD;  Location: [facility];  Service: Urology;  Laterality: Left;   CYSTOSCOPY/URETEROSCOPY/HOLMIUM LASER/STENT PLACEMENT Right 12/04/2018   Procedure: CYSTOSCOPY/URETEROSCOPY/HOLMIUM LASER/STENT PLACEMENT;  Surgeon: Hollice Espy, MD;  Location: [facility];  Service: Urology;  Laterality: Right;   DISTAL BICEPS TENDON REPAIR Right 01/19/2017   Procedure: BICEPS TENODESIS;  Surgeon: Corky Mull, MD;  Location: [place];  Service: Orthopedics;  Laterality: Right;  Diabetic - oral meds   ESOPHAGOGASTRODUODENOSCOPY (EGD) WITH PROPOFOL N/A 01/31/2015   Procedure: ESOPHAGOGASTRODUODENOSCOPY (EGD) WITH PROPOFOL;  Surgeon: Manya Silvas, MD;  Location: [facility] ENDOSCOPY;  Service: Endoscopy;  Laterality: N/A;   ESOPHAGOGASTRODUODENOSCOPY (EGD) WITH PROPOFOL N/A 06/19/2020   Procedure: ESOPHAGOGASTRODUODENOSCOPY (EGD) WITH PROPOFOL;  Surgeon: Lucilla Lame, MD;  Location: [facility] ENDOSCOPY;  Service: Endoscopy;  Laterality: N/A;   ESOPHAGOGASTRODUODENOSCOPY (EGD) WITH PROPOFOL N/A 12/25/2020   Procedure: ESOPHAGOGASTRODUODENOSCOPY (EGD) WITH PROPOFOL;  Surgeon: Lucilla Lame, MD;  Location: [facility] ENDOS[facility];  Service: Endoscopy;  Laterality: N/A;   ESOPHAGOGASTRODUODENOSCOPY (EGD) WITH PROPOFOL N/A 12/22/2021   Procedure: ESOPHAGOGASTRODUODENOSCOPY (EGD) WITH PROPOFOL;  Surgeon: Lucilla Lame, MD;  Location: [facility]OSCOPY;   Service: Endoscopy;  Laterality: N/A;   ESOPHAGOGASTRODUODENOSCOPY (EGD) WITH PROPOFOL N/A 01/28/2022   Procedure: ESOPHAGOGASTRODUODENOSCOPY (EGD) WITH PROPOFOL;  Surgeon: Lucilla Lame, MD;  Location: [facility] ENDOSCOPY;  Service: Endoscopy;  Laterality: N/A;   esophogastroduodenoscopy     esophogastroduodeoscopy     HERNIA REPAIR     IR NEPHROSTOMY PLACEMENT LEFT  05/22/2018   NEPHROLITHOTOMY Left 05/22/2018   Procedure: NEPHROLITHOTOMY PERCUTANEOUS;  Surgeon: Hollice Espy, MD;  Location: [facility];  Service: Urology;  Laterality: Left;   SAVORY DILATION N/A 01/31/2015   Procedure: SAVORY DILATION;  Surgeon: Manya Silvas, MD;  Location: [facility] ENDOSCOPY;  Service: Endoscopy;  Laterality: N/A;   SHOULDER ARTHROSCOPY Right 01/19/2017   Procedure: ARTHROSCOPY SHOULDER DEBRIDEMENT DECOMPRESSION AND  ROTATOR CUFF REPAIR AND BICEPS TENODESIS;  Surgeon: Corky Mull, MD;  Location: [place];  Service: Orthopedics;  Laterality: Right;   TOTAL HIP ARTHROPLASTY Right 03/18/2022   Procedure: Right posterior total hip arthroplasty;  Surgeon: Steffanie Rainwater, MD;  Location: [facility] ORS;  Service: Orthopedics;  Laterality: Right;    Home Medications:  Allergies as of 05/24/2022   No Known Allergies      Medication List        Accurate as of May 24, 2022  2:16 PM. If you have any questions, ask your nurse or doctor.          STOP taking these medications    docusate sodium 100 MG capsule Commonly known as: COLACE Stopped by: Joanthan Hlavacek, PA-C       TAKE these medications    acetaminophen 500 MG tablet Commonly known as: TYLENOL Take 2 tablets (1,000 mg total) by mouth every 8 (eight) hours.   buPROPion 300 MG 24 hr tablet Commonly known as: WELLBUTRIN XL Take 300 mg by mouth daily.   cyanocobalamin 1000 MCG tablet Commonly known as: VITAMIN B12 Take 1,000 mcg by mouth daily.   DULoxetine 60 MG capsule Commonly known as: CYMBALTA Take 60 mg by mouth  daily.   enoxaparin 40 MG/0.4ML injection Commonly known as: LOVENOX Inject 0.4 mLs (40 mg total) into the skin daily for 14 days.   lisinopril-hydrochlorothiazide 20-25 MG tablet Commonly known as: ZESTORETIC Take 0.5 tablets by mouth daily.   meloxicam 15 MG tablet Commonly known as: MOBIC Take by mouth.   metFORMIN 500 MG tablet Commonly known as: GLUCOPHAGE Take 500 mg by mouth 2 (two) times daily with a meal.   pantoprazole 40 MG tablet Commonly known as: PROTONIX Take 40 mg by mouth daily.   potassium chloride 8 MEQ tablet Commonly known as: KLOR-CON 8 mEq daily.   propranolol ER 60 MG 24 hr capsule Commonly known as: INDERAL LA Take 60 mg by mouth daily.   rosuvastatin 5 MG tablet Commonly known as: CRESTOR Take 5 mg by mouth at bedtime.   traMADol 50 MG tablet Commonly known as: ULTRAM Take 1 tablet (50 mg total) by mouth every 6 (six) hours as needed for moderate pain.   traZODone 50 MG tablet Commonly known as: DESYREL Take 50 mg by mouth at bedtime.   Vitamin D3 50 MCG (2000 UT) Caps Generic drug: Cholecalciferol Take 2,000 Units by mouth daily.        Allergies: No Known Allergies  Family History: No family history on file.  Social History:  reports  that she quit smoking about 14 years ago. Her smoking use included cigarettes. She has a 37.50 pack-year smoking history. She has never used smokeless tobacco. She reports that she does not drink alcohol and does not use drugs.  ROS: Pertinent ROS in HPI  Physical Exam: BP (!) 150/82 (BP Location: Left Arm, Patient Position: Sitting, Cuff Size: Normal)   Pulse (!) 127   Ht '5\' 3"'$  (1.6 m)   Wt 164 lb (74.4 kg)   BMI 29.05 kg/m   Constitutional:  Well nourished. Alert and oriented, No acute distress. HEENT: [place] AT, moist mucus membranes.  Trachea midline, no masses. Cardiovascular: No clubbing, cyanosis, or edema. Respiratory: Normal respiratory effort, no increased work of breathing. GU: No  CVA tenderness.  No bladder fullness or masses. Neurologic: Grossly intact, no focal deficits, moving all 4 extremities. Psychiatric: Normal mood and affect.    Laboratory Data: Lab Results  Component Value Date   WBC 10.3 03/19/2022   HGB 10.8 (L) 03/19/2022   HCT 32.5 (L) 03/19/2022   MCV 92.3 03/19/2022   PLT 235 03/19/2022    Lab Results  Component Value Date   CREATININE 1.31 (H) 03/19/2022    Lab Results  Component Value Date   AST 17 03/08/2022   Lab Results  Component Value Date   ALT 13 03/08/2022  I have reviewed the labs.   Pertinent Imaging: CLINICAL DATA:  Right lower quadrant of the nominal pain. History of renal stones.   EXAM: CT ABDOMEN AND PELVIS WITHOUT CONTRAST   TECHNIQUE: Multidetector CT imaging of the abdomen and pelvis was performed following the standard protocol without IV contrast.   RADIATION DOSE REDUCTION: This exam was performed according to the departmental dose-optimization program which includes automated exposure control, adjustment of the mA and/or kV according to patient size and/or use of iterative reconstruction technique.   COMPARISON:  CT December 10, 2018   FINDINGS: Lower chest: Moderate-sized hiatal hernia.   Hepatobiliary: Unremarkable noncontrast enhanced appearance of the hepatic parenchyma. Gallbladder surgically absent. No biliary ductal dilation.   Pancreas: No pancreatic ductal dilation or evidence of acute inflammation.   Spleen: No splenomegaly.   Adrenals/Urinary Tract: Bilateral adrenal glands are within normal limits.   Left-sided perinephric/periureteric stranding with hydroureteronephrosis to a 3 mm stone in the distal left ureter between the pelvic inlet in the UVJ on image 94/4. Additional nonobstructive bilateral renal calculi measure up to 10 mm in the left renal pelvis. Hypodense 4 cm right renal lesion measures fluid density compatible with a cyst and is considered benign requiring  no independent imaging follow-up. Urinary bladder is unremarkable for degree of distension within limitation of streak artifact from right hip arthroplasty.   Stomach/Bowel: Moderate-sized hiatal hernia. No pathologic dilation of small or large bowel. Colonic diverticulosis without findings of acute diverticulitis.   Vascular/Lymphatic: Aortic atherosclerosis. Smooth IVC contours. No pathologically enlarged abdominal or pelvic lymph nodes.   Reproductive: Unchanged size of the 2 cm right ovarian cyst, considered benign and requiring no independent imaging follow-up.   Other: No significant abdominopelvic free fluid.   Musculoskeletal: Streak artifact from right hip arthroplasty. Diffuse demineralization of bone. Multilevel degenerative changes spine.   IMPRESSION: 1. Left-sided perinephric/periureteric stranding with hydroureteronephrosis to a 3 mm stone in the distal left ureter between the pelvic inlet in the UVJ, suggest correlation with urinalysis to exclude superimposed on infection. 2. Additional nonobstructive bilateral renal calculi measure up to 10 mm in the left renal pelvis. 3. Moderate-sized hiatal hernia. 4.  Colonic diverticulosis without findings of acute diverticulitis. 5.  Aortic Atherosclerosis (ICD10-I70.0).     Electronically Signed   By: Dahlia Bailiff M.D.  On: 05/24/2022 13:43  I have independently reviewed the films.    Assessment & Plan:    1. Left lower quadrant pain -Patient has a history of diverticulosis which is more consistent with the area of pain, but we definitely cannot rule out a stone as a cause of her symptoms -I have ordered a stat CT renal stone study for further evaluation of her symptoms -CT renal stone study indicated a 3 mm stone in the distal left ureter associated with left-sided perinephric/periureteral stranding with hydroureteronephrosis she also has a 10 mm stone in her left renal pelvis -She was mildly tachycardic here in  the office with a pulse of 129 and when I talked to her concerning her CT results she states that she just feels "raunchy" -I advised her to seek further treatment in the emergency department as I am concerned that she is likely dehydrated and possibly infected or in the early stages of sepsis -We were not able to get a urine specimen from her in the office after she drank 3 bottles of water with [place], she did state she was able to squeeze a small amount out after her CT scan at [facility] and she is bringing that specimen to our [place] office for urinalysis and urine culture -I spoke to her again just prior to going to the ED and her temp was 99.0 she stated she still did not feel better and had not been able to give an adequate specimen for urinalysis  These notes generated with voice recognition software. I apologize for typographical errors.  [place], [place] [address]  [place], [place] [address] [phone]

## 2022-05-24 NOTE — Op Note (Signed)
Date of procedure: 05/24/22  Preoperative diagnosis:  Left obstructing ureteral calculus Sepsis of probable urinary source  Postoperative diagnosis:  Same as above  Procedure: Left ureteral stent placement Left retrograde pyelogram  Surgeon: Hollice Espy, MD  Anesthesia: General  Complications: None  Intraoperative findings: Left distal ureteral calculus not definitively seen on retrograde with fullness of the renal pelvis but no overt hydronephrosis.  Stent placed without difficulty.  Some debris in the bladder which is malodorous.  Clear E flux upon stent placement.  EBL: Minimal  Specimens: None  Drains: 6 x 24 French double-J ureteral stent on left, no tether  Indication: Sheryl [name] is a 72 y.o. patient with obstructing 3 mm left distal ureteral calculus as well as renal pelvic stone meeting sepsis criteria.  After reviewing the management options for treatment, she elected to proceed with the above surgical procedure(s). We have discussed the potential benefits and risks of the procedure, side effects of the proposed treatment, the likelihood of the patient achieving the goals of the procedure, and any potential problems that might occur during the procedure or recuperation. Informed consent has been obtained.  Description of procedure:  The patient was taken to the operating room and general anesthesia was induced.  The patient was placed in the dorsal lithotomy position, prepped and draped in the usual sterile fashion, and preoperative antibiotics were administered. A preoperative time-out was performed.   A 21 French cystoscope was advanced per urethra into the bladder.  Attention was turned to the left ureteral orifice.  This was intubated using a 5 [place] open-ended ureteral catheter.  A gentle retrograde pyelogram was performed which demonstrated no discrete filling defects within the distal ureter and the stone was not definitively seen on scout.  There was the  course renal pelvic stone with some fullness/dilation of the renal pelvis without overt hydronephrosis.  Some debris within the bladder but no distinct erythema.  A sensor wire was advanced up to the level of the kidney.  A 6 x 24 French double-J ureteral stent was advanced over the wire up to the level of the kidney.  The wire was withdrawn and a full coil was noted both within the renal pelvis as well as within the bladder.  The bladder was then drained.  Patient was then cleaned and dried, repositioned in supine position, reversed of anesthesia, and taken to the PACU in stable condition.  Plan: She will be admitted to the medicine service for IV antibiotics and supportive care.  Will plan for definitive management of her stone burden as an outpatient.  Hollice Espy, M.D.

## 2022-05-24 NOTE — ED Notes (Signed)
Pt to OR with tech at this time. Report given.

## 2022-05-24 NOTE — ED Provider Notes (Signed)
[facility] Provider Note    Event Date/Time   First MD Initiated Contact with Patient 05/24/22 1813     (approximate)   History   Flank Pain   HPI  ADAIRA WESTERVELT is a 72 y.o. female with history of kidney stones who comes in with left flank pain.  Patient seen at urology I reviewed this note and she was noted to have a CT scan with a 3 mm stone on the left side with obstruction and possible coinfection.  Patient reports having a fever of 102 and elevated heart rates therefore came in per urology's recommendation and concern for septic stone.  Patient denies ever having stent placement before.  Denies any cough or other symptoms.     Physical Exam   Triage Vital Signs: ED Triage Vitals [05/24/22 1718]  Enc Vitals Group     BP (!) 152/79     Pulse Rate (!) 131     Resp 18     Temp 100.1 F (37.8 C)     Temp Source Oral     SpO2 94 %     Weight      Height      Head Circumference      Peak Flow      Pain Score 7     Pain Loc      Pain Edu?      Excl. in [place]?     Most recent vital signs: Vitals:   05/24/22 1718 05/24/22 1828  BP: (!) 152/79 125/67  Pulse: (!) 131 (!) 114  Resp: 18 16  Temp: 100.1 F (37.8 C) 99.8 F (37.7 C)  SpO2: 94% 95%     General: Awake, no distress.  CV:  Good peripheral perfusion.  Resp:  Normal effort.  Abd:  No distention.  Soft nontender Other:     ED Results / Procedures / Treatments   Labs (all labs ordered are listed, but only abnormal results are displayed) Labs Reviewed  COMPREHENSIVE METABOLIC PANEL - Abnormal; Notable for the following components:      Result Value   Sodium 133 (*)    Potassium 2.9 (*)    CO2 21 (*)    Glucose, Bld 148 (*)    Creatinine, Ser 1.51 (*)    Calcium 8.8 (*)    Albumin 3.4 (*)    GFR, Estimated 37 (*)    All other components within normal limits  CBC WITH DIFFERENTIAL/PLATELET - Abnormal; Notable for the following components:   WBC 15.6 (*)    Neutro Abs  13.6 (*)    Monocytes Absolute 1.1 (*)    Abs Immature Granulocytes 0.11 (*)    All other components within normal limits  CULTURE, BLOOD (ROUTINE X 2)  CULTURE, BLOOD (ROUTINE X 2)  RESP PANEL BY RT-PCR (RSV, FLU A&B, COVID)  [id number]  URINE CULTURE  LACTIC ACID, PLASMA  LACTIC ACID, PLASMA  URINALYSIS, ROUTINE W REFLEX MICROSCOPIC   RADIOLOGY I have reviewed the xray personally and interpreted and no pneumonia  PROCEDURES:  Critical Care performed: Yes, see critical care procedure note(s)  .1-3 Lead EKG Interpretation  Performed by: Vanessa Kiowa, MD Authorized by: Vanessa [place], MD     Interpretation: normal     ECG rate:  90   ECG rate assessment: normal     Rhythm: sinus rhythm     Ectopy: none     Conduction: normal   .Critical Care  Performed by: Jari Pigg,  Royetta Crochet, MD Authorized by: Vanessa Wing, MD   Critical care provider statement:    Critical care time (minutes):  30   Critical care was necessary to treat or prevent imminent or life-threatening deterioration of the following conditions:  Sepsis   Critical care was time spent personally by me on the following activities:  Development of treatment plan with patient or surrogate, discussions with consultants, evaluation of patient's response to treatment, examination of patient, ordering and review of laboratory studies, ordering and review of radiographic studies, ordering and performing treatments and interventions, pulse oximetry, re-evaluation of patient's condition and review of old charts    MEDICATIONS ORDERED IN ED: Medications  potassium chloride 10 mEq in 100 mL IVPB (10 mEq Intravenous Not Given 05/24/22 2329)  insulin aspart (novoLOG) injection 0-15 Units (has no administration in time range)  insulin aspart (novoLOG) injection 0-5 Units ( Subcutaneous Not Given 05/24/22 2255)  enoxaparin (LOVENOX) injection 40 mg (has no administration in time range)  cefTRIAXone (ROCEPHIN) 2 g in sodium chloride 0.9 % 100  mL IVPB (has no administration in time range)  ondansetron (ZOFRAN) tablet 4 mg (has no administration in time range)    Or  ondansetron (ZOFRAN) injection 4 mg (has no administration in time range)  morphine (PF) 2 MG/ML injection 2 mg (has no administration in time range)  lactated ringers 1,000 mL with potassium chloride 40 mEq infusion (has no administration in time range)  rosuvastatin (CRESTOR) tablet 5 mg (has no administration in time range)  propranolol ER (INDERAL LA) 24 hr capsule 60 mg (has no administration in time range)  pantoprazole (PROTONIX) EC tablet 40 mg (40 mg Oral Given 05/24/22 2253)  traZODone (DESYREL) tablet 50 mg (has no administration in time range)  DULoxetine (CYMBALTA) DR capsule 60 mg (has no administration in time range)  potassium chloride 10 mEq in 100 mL IVPB (10 mEq Intravenous New Bag/Given 05/24/22 2258)  cefTRIAXone (ROCEPHIN) 2 g in sodium chloride 0.9 % 100 mL IVPB (0 g Intravenous Stopped 05/24/22 1857)  sodium chloride 0.9 % bolus 1,000 mL (0 mLs Intravenous Stopped 05/24/22 1918)  sodium chloride 0.9 % bolus 1,000 mL (1,000 mLs Intravenous New Bag/Given 05/24/22 1934)  sodium chloride 0.9 % bolus 500 mL (500 mLs Intravenous New Bag/Given 05/24/22 1935)     IMPRESSION / MDM / [place] / ED COURSE  I reviewed the triage vital signs and the nursing notes.   Patient's presentation is most consistent with acute presentation with potential threat to life or bodily function.   Patient comes in with what is concerning for sepsis.  Sepsis alert called patient started on ceftriaxone patient given fluids.  Suspect most likely from septic stone.  UA concerning for UTI.  I did discuss with Dr. Erlene Quan who will take patient to the OR for stent placement.  Patient given broad-spectrum antibiotics, fluids.  I did discuss hospital team for admission.  The patient is on the cardiac monitor to evaluate for evidence of arrhythmia and/or significant heart rate  changes.      FINAL CLINICAL IMPRESSION(S) / ED DIAGNOSES   Final diagnoses:  Kidney stone  Sepsis, due to unspecified organism, unspecified whether acute organ dysfunction present [facility])     Rx / DC Orders   ED Discharge Orders     None        Note:  This document was prepared using Dragon voice recognition software and may include unintentional dictation errors.   Vanessa [place],  MD 05/24/22 2349

## 2022-05-24 NOTE — H&P (View-Only) (Signed)
05/24/2022 2:16 PM   Sheryl [name] 02-21-1951 [id number]  Referring provider: Latanya Maudlin, NP [address] [place],  [place] [id number]  Urological history: 1.  Nephrolithiasis -Stone composition 100% calcium oxalate monohydrate -123456 urine metabolic evaluation-very low urinary volume, borderline hyperoxaluria, low pH and low urinary calcium -right URS (11/2018) -left URS (06/2018) -left PCNL (05/2018)   Chief Complaint  Patient presents with   Nephrolithiasis    HPI: Sheryl [name] is a 72 y.o. female who presents today for last week one day had some pain in the lower left abdomen that went away but then this weekend had more intense pain and constant, fever off and on 102 highest.  She was treated for community-acquired pneumonia back in January.  She has been having left lower quadrant pain since Friday.  This has been associated with a decrease in appetite, nausea, fevers (102.0 or so) and chills.  She denies any pain in her flanks.  Patient denies any modifying or aggravating factors.  Patient denies any gross hematuria, dysuria or suprapubic.      KUB bilateral renal stone and a possible proximal calcification on the tip of L3.    PMH: Past Medical History:  Diagnosis Date   Anesthesia complication    bradycardia   Anxiety    Arthritis    lower back, left hip    Chicken pox    Chronic kidney disease    Depression    Diabetes mellitus without complication (HCC)    GERD (gastroesophageal reflux disease)    Hemochromatosis carrier    History of hiatal hernia    History of kidney stones    Hypercholesterolemia    Hypertension    Migraines    rare now   Nephrolithiasis    PONV (postoperative nausea and vomiting)    VOMITED A LITTLE BIT AFTER KIDNEY STONE SURGERY    Surgical History: Past Surgical History:  Procedure Laterality Date   BREAST CYST ASPIRATION Left    negative 2012   CHOLECYSTECTOMY     COLONOSCOPY     COLONOSCOPY WITH  PROPOFOL N/A 12/25/2020   Procedure: COLONOSCOPY WITH PROPOFOL;  Surgeon: Lucilla Lame, MD;  Location: [facility];  Service: Endoscopy;  Laterality: N/A;   COLONOSCOPY, ESOPHAGOGASTRODUODENOSCOPY (EGD) AND ESOPHAGEAL DILATION     CYSTOSCOPY W/ RETROGRADES Bilateral 12/04/2018   Procedure: CYSTOSCOPY WITH RETROGRADE PYELOGRAM;  Surgeon: Hollice Espy, MD;  Location: [facility];  Service: Urology;  Laterality: Bilateral;   CYSTOSCOPY/URETEROSCOPY/HOLMIUM LASER/STENT PLACEMENT Left 06/26/2018   Procedure: CYSTOSCOPY/URETEROSCOPY/HOLMIUM LASER/STENT Exchange;  Surgeon: Hollice Espy, MD;  Location: [facility];  Service: Urology;  Laterality: Left;   CYSTOSCOPY/URETEROSCOPY/HOLMIUM LASER/STENT PLACEMENT Right 12/04/2018   Procedure: CYSTOSCOPY/URETEROSCOPY/HOLMIUM LASER/STENT PLACEMENT;  Surgeon: Hollice Espy, MD;  Location: [facility];  Service: Urology;  Laterality: Right;   DISTAL BICEPS TENDON REPAIR Right 01/19/2017   Procedure: BICEPS TENODESIS;  Surgeon: Corky Mull, MD;  Location: [facility];  Service: Orthopedics;  Laterality: Right;  Diabetic - oral meds   ESOPHAGOGASTRODUODENOSCOPY (EGD) WITH PROPOFOL N/A 01/31/2015   Procedure: ESOPHAGOGASTRODUODENOSCOPY (EGD) WITH PROPOFOL;  Surgeon: Manya Silvas, MD;  Location: [facility];  Service: Endoscopy;  Laterality: N/A;   ESOPHAGOGASTRODUODENOSCOPY (EGD) WITH PROPOFOL N/A 06/19/2020   Procedure: ESOPHAGOGASTRODUODENOSCOPY (EGD) WITH PROPOFOL;  Surgeon: Lucilla Lame, MD;  Location: [facility] ENDOSCOPY;  Service: Endoscopy;  Laterality: N/A;   ESOPHAGOGASTRODUODENOSCOPY (EGD) WITH PROPOFOL N/A 12/25/2020   Procedure: ESOPHAGOGASTRODUODENOSCOPY (EGD) WITH PROPOFOL;  Surgeon: Lucilla Lame, MD;  Location: [facility];  Service: Endoscopy;  Laterality: N/A;   ESOPHAGOGASTRODUODENOSCOPY (EGD) WITH PROPOFOL N/A 12/22/2021   Procedure: ESOPHAGOGASTRODUODENOSCOPY (EGD) WITH PROPOFOL;  Surgeon: Lucilla Lame, MD;  Location: [facility];   Service: Endoscopy;  Laterality: N/A;   ESOPHAGOGASTRODUODENOSCOPY (EGD) WITH PROPOFOL N/A 01/28/2022   Procedure: ESOPHAGOGASTRODUODENOSCOPY (EGD) WITH PROPOFOL;  Surgeon: Lucilla Lame, MD;  Location: [facility];  Service: Endoscopy;  Laterality: N/A;   esophogastroduodenoscopy     esophogastroduodeoscopy     HERNIA REPAIR     IR NEPHROSTOMY PLACEMENT LEFT  05/22/2018   NEPHROLITHOTOMY Left 05/22/2018   Procedure: NEPHROLITHOTOMY PERCUTANEOUS;  Surgeon: Hollice Espy, MD;  Location: [facility];  Service: Urology;  Laterality: Left;   SAVORY DILATION N/A 01/31/2015   Procedure: SAVORY DILATION;  Surgeon: Manya Silvas, MD;  Location: [facility] ENDOSCOPY;  Service: Endoscopy;  Laterality: N/A;   SHOULDER ARTHROSCOPY Right 01/19/2017   Procedure: ARTHROSCOPY SHOULDER DEBRIDEMENT DECOMPRESSION AND  ROTATOR CUFF REPAIR AND BICEPS TENODESIS;  Surgeon: Corky Mull, MD;  Location: [facility];  Service: Orthopedics;  Laterality: Right;   TOTAL HIP ARTHROPLASTY Right 03/18/2022   Procedure: Right posterior total hip arthroplasty;  Surgeon: Steffanie Rainwater, MD;  Location: [facility];  Service: Orthopedics;  Laterality: Right;    Home Medications:  Allergies as of 05/24/2022   No Known Allergies      Medication List        Accurate as of May 24, 2022  2:16 PM. If you have any questions, ask your nurse or doctor.          STOP taking these medications    docusate sodium 100 MG capsule Commonly known as: COLACE Stopped by: Versie Fleener, PA-C       TAKE these medications    acetaminophen 500 MG tablet Commonly known as: TYLENOL Take 2 tablets (1,000 mg total) by mouth every 8 (eight) hours.   buPROPion 300 MG 24 hr tablet Commonly known as: WELLBUTRIN XL Take 300 mg by mouth daily.   cyanocobalamin 1000 MCG tablet Commonly known as: VITAMIN B12 Take 1,000 mcg by mouth daily.   DULoxetine 60 MG capsule Commonly known as: CYMBALTA Take 60 mg by mouth  daily.   enoxaparin 40 MG/0.4ML injection Commonly known as: LOVENOX Inject 0.4 mLs (40 mg total) into the skin daily for 14 days.   lisinopril-hydrochlorothiazide 20-25 MG tablet Commonly known as: ZESTORETIC Take 0.5 tablets by mouth daily.   meloxicam 15 MG tablet Commonly known as: MOBIC Take by mouth.   metFORMIN 500 MG tablet Commonly known as: GLUCOPHAGE Take 500 mg by mouth 2 (two) times daily with a meal.   pantoprazole 40 MG tablet Commonly known as: PROTONIX Take 40 mg by mouth daily.   potassium chloride 8 MEQ tablet Commonly known as: KLOR-CON 8 mEq daily.   propranolol ER 60 MG 24 hr capsule Commonly known as: INDERAL LA Take 60 mg by mouth daily.   rosuvastatin 5 MG tablet Commonly known as: CRESTOR Take 5 mg by mouth at bedtime.   traMADol 50 MG tablet Commonly known as: ULTRAM Take 1 tablet (50 mg total) by mouth every 6 (six) hours as needed for moderate pain.   traZODone 50 MG tablet Commonly known as: DESYREL Take 50 mg by mouth at bedtime.   Vitamin D3 50 MCG (2000 UT) Caps Generic drug: Cholecalciferol Take 2,000 Units by mouth daily.        Allergies: No Known Allergies  Family History: No family history on file.  Social History:  reports  that she quit smoking about 14 years ago. Her smoking use included cigarettes. She has a 37.50 pack-year smoking history. She has never used smokeless tobacco. She reports that she does not drink alcohol and does not use drugs.  ROS: Pertinent ROS in HPI  Physical Exam: BP (!) 150/82 (BP Location: Left Arm, Patient Position: Sitting, Cuff Size: Normal)   Pulse (!) 127   Ht 5' 3"$  (1.6 m)   Wt 164 lb (74.4 kg)   BMI 29.05 kg/m   Constitutional:  Well nourished. Alert and oriented, No acute distress. HEENT: [place] AT, moist mucus membranes.  Trachea midline, no masses. Cardiovascular: No clubbing, cyanosis, or edema. Respiratory: Normal respiratory effort, no increased work of breathing. GU: No  CVA tenderness.  No bladder fullness or masses. Neurologic: Grossly intact, no focal deficits, moving all 4 extremities. Psychiatric: Normal mood and affect.    Laboratory Data: Lab Results  Component Value Date   WBC 10.3 03/19/2022   HGB 10.8 (L) 03/19/2022   HCT 32.5 (L) 03/19/2022   MCV 92.3 03/19/2022   PLT 235 03/19/2022    Lab Results  Component Value Date   CREATININE 1.31 (H) 03/19/2022    Lab Results  Component Value Date   AST 17 03/08/2022   Lab Results  Component Value Date   ALT 13 03/08/2022  I have reviewed the labs.   Pertinent Imaging: CLINICAL DATA:  Right lower quadrant of the nominal pain. History of renal stones.   EXAM: CT ABDOMEN AND PELVIS WITHOUT CONTRAST   TECHNIQUE: Multidetector CT imaging of the abdomen and pelvis was performed following the standard protocol without IV contrast.   RADIATION DOSE REDUCTION: This exam was performed according to the departmental dose-optimization program which includes automated exposure control, adjustment of the mA and/or kV according to patient size and/or use of iterative reconstruction technique.   COMPARISON:  CT December 10, 2018   FINDINGS: Lower chest: Moderate-sized hiatal hernia.   Hepatobiliary: Unremarkable noncontrast enhanced appearance of the hepatic parenchyma. Gallbladder surgically absent. No biliary ductal dilation.   Pancreas: No pancreatic ductal dilation or evidence of acute inflammation.   Spleen: No splenomegaly.   Adrenals/Urinary Tract: Bilateral adrenal glands are within normal limits.   Left-sided perinephric/periureteric stranding with hydroureteronephrosis to a 3 mm stone in the distal left ureter between the pelvic inlet in the UVJ on image 94/4. Additional nonobstructive bilateral renal calculi measure up to 10 mm in the left renal pelvis. Hypodense 4 cm right renal lesion measures fluid density compatible with a cyst and is considered benign requiring  no independent imaging follow-up. Urinary bladder is unremarkable for degree of distension within limitation of streak artifact from right hip arthroplasty.   Stomach/Bowel: Moderate-sized hiatal hernia. No pathologic dilation of small or large bowel. Colonic diverticulosis without findings of acute diverticulitis.   Vascular/Lymphatic: Aortic atherosclerosis. Smooth IVC contours. No pathologically enlarged abdominal or pelvic lymph nodes.   Reproductive: Unchanged size of the 2 cm right ovarian cyst, considered benign and requiring no independent imaging follow-up.   Other: No significant abdominopelvic free fluid.   Musculoskeletal: Streak artifact from right hip arthroplasty. Diffuse demineralization of bone. Multilevel degenerative changes spine.   IMPRESSION: 1. Left-sided perinephric/periureteric stranding with hydroureteronephrosis to a 3 mm stone in the distal left ureter between the pelvic inlet in the UVJ, suggest correlation with urinalysis to exclude superimposed on infection. 2. Additional nonobstructive bilateral renal calculi measure up to 10 mm in the left renal pelvis. 3. Moderate-sized hiatal hernia. 4.  Colonic diverticulosis without findings of acute diverticulitis. 5.  Aortic Atherosclerosis (ICD10-I70.0).     Electronically Signed   By: Dahlia Bailiff M.D.  On: 05/24/2022 13:43  I have independently reviewed the films.    Assessment & Plan:    1. Left lower quadrant pain -Patient has a history of diverticulosis which is more consistent with the area of pain, but we definitely cannot rule out a stone as a cause of her symptoms -I have ordered a stat CT renal stone study for further evaluation of her symptoms -CT renal stone study indicated a 3 mm stone in the distal left ureter associated with left-sided perinephric/periureteral stranding with hydroureteronephrosis she also has a 10 mm stone in her left renal pelvis -She was mildly tachycardic here in  the office with a pulse of 129 and when I talked to her concerning her CT results she states that she just feels "raunchy" -I advised her to seek further treatment in the emergency department as I am concerned that she is likely dehydrated and possibly infected or in the early stages of sepsis -We were not able to get a urine specimen from her in the office after she drank 3 bottles of water with [place], she did state she was able to squeeze a small amount out after her CT scan at [facility] and she is bringing that specimen to our [place] office for urinalysis and urine culture -I spoke to her again just prior to going to the ED and her temp was 99.0 she stated she still did not feel better and had not been able to give an adequate specimen for urinalysis  These notes generated with voice recognition software. I apologize for typographical errors.  [place], [place] [address]  [place], [place] [address] [phone]

## 2022-05-24 NOTE — Progress Notes (Signed)
05/24/2022 2:16 PM   Sheryl [name] 21-Apr-1950 [id number]  Referring provider: Latanya Maudlin, NP [address] [place],  [place] [id number]  Urological history: 1.  Nephrolithiasis -Stone composition 100% calcium oxalate monohydrate -123456 urine metabolic evaluation-very low urinary volume, borderline hyperoxaluria, low pH and low urinary calcium -right URS (11/2018) -left URS (06/2018) -left PCNL (05/2018)   Chief Complaint  Patient presents with   Nephrolithiasis    HPI: Sheryl [name] is a 72 y.o. female who presents today for last week one day had some pain in the lower left abdomen that went away but then this weekend had more intense pain and constant, fever off and on 102 highest.  She was treated for community-acquired pneumonia back in January.  She has been having left lower quadrant pain since Friday.  This has been associated with a decrease in appetite, nausea, fevers (102.0 or so) and chills.  She denies any pain in her flanks.  Patient denies any modifying or aggravating factors.  Patient denies any gross hematuria, dysuria or suprapubic.      KUB bilateral renal stone and a possible proximal calcification on the tip of L3.    PMH: Past Medical History:  Diagnosis Date   Anesthesia complication    bradycardia   Anxiety    Arthritis    lower back, left hip    Chicken pox    Chronic kidney disease    Depression    Diabetes mellitus without complication (HCC)    GERD (gastroesophageal reflux disease)    Hemochromatosis carrier    History of hiatal hernia    History of kidney stones    Hypercholesterolemia    Hypertension    Migraines    rare now   Nephrolithiasis    PONV (postoperative nausea and vomiting)    VOMITED A LITTLE BIT AFTER KIDNEY STONE SURGERY    Surgical History: Past Surgical History:  Procedure Laterality Date   BREAST CYST ASPIRATION Left    negative 2012   CHOLECYSTECTOMY     COLONOSCOPY     COLONOSCOPY WITH  PROPOFOL N/A 12/25/2020   Procedure: COLONOSCOPY WITH PROPOFOL;  Surgeon: Lucilla Lame, MD;  Location: [facility] ENDOSCOPY;  Service: Endoscopy;  Laterality: N/A;   COLONOSCOPY, ESOPHAGOGASTRODUODENOSCOPY (EGD) AND ESOPHAGEAL DILATION     CYSTOSCOPY W/ RETROGRADES Bilateral 12/04/2018   Procedure: CYSTOSCOPY WITH RETROGRADE PYELOGRAM;  Surgeon: Hollice Espy, MD;  Location: [facility];  Service: Urology;  Laterality: Bilateral;   CYSTOSCOPY/URETEROSCOPY/HOLMIUM LASER/STENT PLACEMENT Left 06/26/2018   Procedure: CYSTOSCOPY/URETEROSCOPY/HOLMIUM LASER/STENT Exchange;  Surgeon: Hollice Espy, MD;  Location: [facility];  Service: Urology;  Laterality: Left;   CYSTOSCOPY/URETEROSCOPY/HOLMIUM LASER/STENT PLACEMENT Right 12/04/2018   Procedure: CYSTOSCOPY/URETEROSCOPY/HOLMIUM LASER/STENT PLACEMENT;  Surgeon: Hollice Espy, MD;  Location: [facility];  Service: Urology;  Laterality: Right;   DISTAL BICEPS TENDON REPAIR Right 01/19/2017   Procedure: BICEPS TENODESIS;  Surgeon: Corky Mull, MD;  Location: [place];  Service: Orthopedics;  Laterality: Right;  Diabetic - oral meds   ESOPHAGOGASTRODUODENOSCOPY (EGD) WITH PROPOFOL N/A 01/31/2015   Procedure: ESOPHAGOGASTRODUODENOSCOPY (EGD) WITH PROPOFOL;  Surgeon: Manya Silvas, MD;  Location: [facility] ENDOSCOPY;  Service: Endoscopy;  Laterality: N/A;   ESOPHAGOGASTRODUODENOSCOPY (EGD) WITH PROPOFOL N/A 06/19/2020   Procedure: ESOPHAGOGASTRODUODENOSCOPY (EGD) WITH PROPOFOL;  Surgeon: Lucilla Lame, MD;  Location: [facility] ENDOSCOPY;  Service: Endoscopy;  Laterality: N/A;   ESOPHAGOGASTRODUODENOSCOPY (EGD) WITH PROPOFOL N/A 12/25/2020   Procedure: ESOPHAGOGASTRODUODENOSCOPY (EGD) WITH PROPOFOL;  Surgeon: Lucilla Lame, MD;  Location: [facility] ENDOSCOPY;  Service: Endoscopy;  Laterality: N/A;   ESOPHAGOGASTRODUODENOSCOPY (EGD) WITH PROPOFOL N/A 12/22/2021   Procedure: ESOPHAGOGASTRODUODENOSCOPY (EGD) WITH PROPOFOL;  Surgeon: Lucilla Lame, MD;  Location: [facility];   Service: Endoscopy;  Laterality: N/A;   ESOPHAGOGASTRODUODENOSCOPY (EGD) WITH PROPOFOL N/A 01/28/2022   Procedure: ESOPHAGOGASTRODUODENOSCOPY (EGD) WITH PROPOFOL;  Surgeon: Lucilla Lame, MD;  Location: [facility] ENDOSCOPY;  Service: Endoscopy;  Laterality: N/A;   esophogastroduodenoscopy     esophogastroduodeoscopy     HERNIA REPAIR     IR NEPHROSTOMY PLACEMENT LEFT  05/22/2018   NEPHROLITHOTOMY Left 05/22/2018   Procedure: NEPHROLITHOTOMY PERCUTANEOUS;  Surgeon: Hollice Espy, MD;  Location: [facility]S;  Service: Urology;  Laterality: Left;   SAVORY DILATION N/A 01/31/2015   Procedure: SAVORY DILATION;  Surgeon: Manya Silvas, MD;  Location: [facility] ENDOSCOPY;  Service: Endoscopy;  Laterality: N/A;   SHOULDER ARTHROSCOPY Right 01/19/2017   Procedure: ARTHROSCOPY SHOULDER DEBRIDEMENT DECOMPRESSION AND  ROTATOR CUFF REPAIR AND BICEPS TENODESIS;  Surgeon: Corky Mull, MD;  Location: [place];  Service: Orthopedics;  Laterality: Right;   TOTAL HIP ARTHROPLASTY Right 03/18/2022   Procedure: Right posterior total hip arthroplasty;  Surgeon: Steffanie Rainwater, MD;  Location: [facility]S;  Service: Orthopedics;  Laterality: Right;    Home Medications:  Allergies as of 05/24/2022   No Known Allergies      Medication List        Accurate as of May 24, 2022  2:16 PM. If you have any questions, ask your nurse or doctor.          STOP taking these medications    docusate sodium 100 MG capsule Commonly known as: COLACE Stopped by: Sixto Bowdish, PA-C       TAKE these medications    acetaminophen 500 MG tablet Commonly known as: TYLENOL Take 2 tablets (1,000 mg total) by mouth every 8 (eight) hours.   buPROPion 300 MG 24 hr tablet Commonly known as: WELLBUTRIN XL Take 300 mg by mouth daily.   cyanocobalamin 1000 MCG tablet Commonly known as: VITAMIN B12 Take 1,000 mcg by mouth daily.   DULoxetine 60 MG capsule Commonly known as: CYMBALTA Take 60 mg by mouth  daily.   enoxaparin 40 MG/0.4ML injection Commonly known as: LOVENOX Inject 0.4 mLs (40 mg total) into the skin daily for 14 days.   lisinopril-hydrochlorothiazide 20-25 MG tablet Commonly known as: ZESTORETIC Take 0.5 tablets by mouth daily.   meloxicam 15 MG tablet Commonly known as: MOBIC Take by mouth.   metFORMIN 500 MG tablet Commonly known as: GLUCOPHAGE Take 500 mg by mouth 2 (two) times daily with a meal.   pantoprazole 40 MG tablet Commonly known as: PROTONIX Take 40 mg by mouth daily.   potassium chloride 8 MEQ tablet Commonly known as: KLOR-CON 8 mEq daily.   propranolol ER 60 MG 24 hr capsule Commonly known as: INDERAL LA Take 60 mg by mouth daily.   rosuvastatin 5 MG tablet Commonly known as: CRESTOR Take 5 mg by mouth at bedtime.   traMADol 50 MG tablet Commonly known as: ULTRAM Take 1 tablet (50 mg total) by mouth every 6 (six) hours as needed for moderate pain.   traZODone 50 MG tablet Commonly known as: DESYREL Take 50 mg by mouth at bedtime.   Vitamin D3 50 MCG (2000 UT) Caps Generic drug: Cholecalciferol Take 2,000 Units by mouth daily.        Allergies: No Known Allergies  Family History: No family history on file.  Social History:  reports  that she quit smoking about 14 years ago. Her smoking use included cigarettes. She has a 37.50 pack-year smoking history. She has never used smokeless tobacco. She reports that she does not drink alcohol and does not use drugs.  ROS: Pertinent ROS in HPI  Physical Exam: BP (!) 150/82 (BP Location: Left Arm, Patient Position: Sitting, Cuff Size: Normal)   Pulse (!) 127   Ht 5' 3"$  (1.6 m)   Wt 164 lb (74.4 kg)   BMI 29.05 kg/m   Constitutional:  Well nourished. Alert and oriented, No acute distress. HEENT: [place] AT, moist mucus membranes.  Trachea midline, no masses. Cardiovascular: No clubbing, cyanosis, or edema. Respiratory: Normal respiratory effort, no increased work of breathing. GU: No  CVA tenderness.  No bladder fullness or masses. Neurologic: Grossly intact, no focal deficits, moving all 4 extremities. Psychiatric: Normal mood and affect.    Laboratory Data: Lab Results  Component Value Date   WBC 10.3 03/19/2022   HGB 10.8 (L) 03/19/2022   HCT 32.5 (L) 03/19/2022   MCV 92.3 03/19/2022   PLT 235 03/19/2022    Lab Results  Component Value Date   CREATININE 1.31 (H) 03/19/2022    Lab Results  Component Value Date   AST 17 03/08/2022   Lab Results  Component Value Date   ALT 13 03/08/2022  I have reviewed the labs.   Pertinent Imaging: CLINICAL DATA:  Right lower quadrant of the nominal pain. History of renal stones.   EXAM: CT ABDOMEN AND PELVIS WITHOUT CONTRAST   TECHNIQUE: Multidetector CT imaging of the abdomen and pelvis was performed following the standard protocol without IV contrast.   RADIATION DOSE REDUCTION: This exam was performed according to the departmental dose-optimization program which includes automated exposure control, adjustment of the mA and/or kV according to patient size and/or use of iterative reconstruction technique.   COMPARISON:  CT December 10, 2018   FINDINGS: Lower chest: Moderate-sized hiatal hernia.   Hepatobiliary: Unremarkable noncontrast enhanced appearance of the hepatic parenchyma. Gallbladder surgically absent. No biliary ductal dilation.   Pancreas: No pancreatic ductal dilation or evidence of acute inflammation.   Spleen: No splenomegaly.   Adrenals/Urinary Tract: Bilateral adrenal glands are within normal limits.   Left-sided perinephric/periureteric stranding with hydroureteronephrosis to a 3 mm stone in the distal left ureter between the pelvic inlet in the UVJ on image 94/4. Additional nonobstructive bilateral renal calculi measure up to 10 mm in the left renal pelvis. Hypodense 4 cm right renal lesion measures fluid density compatible with a cyst and is considered benign requiring  no independent imaging follow-up. Urinary bladder is unremarkable for degree of distension within limitation of streak artifact from right hip arthroplasty.   Stomach/Bowel: Moderate-sized hiatal hernia. No pathologic dilation of small or large bowel. Colonic diverticulosis without findings of acute diverticulitis.   Vascular/Lymphatic: Aortic atherosclerosis. Smooth IVC contours. No pathologically enlarged abdominal or pelvic lymph nodes.   Reproductive: Unchanged size of the 2 cm right ovarian cyst, considered benign and requiring no independent imaging follow-up.   Other: No significant abdominopelvic free fluid.   Musculoskeletal: Streak artifact from right hip arthroplasty. Diffuse demineralization of bone. Multilevel degenerative changes spine.   IMPRESSION: 1. Left-sided perinephric/periureteric stranding with hydroureteronephrosis to a 3 mm stone in the distal left ureter between the pelvic inlet in the UVJ, suggest correlation with urinalysis to exclude superimposed on infection. 2. Additional nonobstructive bilateral renal calculi measure up to 10 mm in the left renal pelvis. 3. Moderate-sized hiatal hernia. 4.  Colonic diverticulosis without findings of acute diverticulitis. 5.  Aortic Atherosclerosis (ICD10-I70.0).     Electronically Signed   By: Dahlia Bailiff M.D.  On: 05/24/2022 13:43  I have independently reviewed the films.    Assessment & Plan:    1. Left lower quadrant pain -Patient has a history of diverticulosis which is more consistent with the area of pain, but we definitely cannot rule out a stone as a cause of her symptoms -I have ordered a stat CT renal stone study for further evaluation of her symptoms -CT renal stone study indicated a 3 mm stone in the distal left ureter associated with left-sided perinephric/periureteral stranding with hydroureteronephrosis she also has a 10 mm stone in her left renal pelvis -She was mildly tachycardic here in  the office with a pulse of 129 and when I talked to her concerning her CT results she states that she just feels "raunchy" -I advised her to seek further treatment in the emergency department as I am concerned that she is likely dehydrated and possibly infected or in the early stages of sepsis -We were not able to get a urine specimen from her in the office after she drank 3 bottles of water with [place], she did state she was able to squeeze a small amount out after her CT scan at [facility] and she is bringing that specimen to our [place] office for urinalysis and urine culture -I spoke to her again just prior to going to the ED and her temp was 99.0 she stated she still did not feel better and had not been able to give an adequate specimen for urinalysis  These notes generated with voice recognition software. I apologize for typographical errors.  Martin, Ruby [address]  [place], [place] [address] [phone]

## 2022-05-24 NOTE — ED Notes (Signed)
Pt attempting to obtain urine sample at this time

## 2022-05-24 NOTE — Transfer of Care (Signed)
Immediate Anesthesia Transfer of Care Note  Patient: Sheryl [name]  Procedure(s) Performed: CYSTOSCOPY WITH STENT PLACEMENT (Left)  Patient Location: PACU  Anesthesia Type:General  Level of Consciousness: sedated  Airway & Oxygen Therapy: Patient Spontanous Breathing and Patient connected to face mask oxygen  Post-op Assessment: Report given to RN and Post -op Vital signs reviewed and stable  Post vital signs: Reviewed and stable  Last Vitals:  Vitals Value Taken Time  BP 112/53 05/24/22 2100  Temp 36.8 C 05/24/22 2057  Pulse 107 05/24/22 2104  Resp 24 05/24/22 2104  SpO2 100 % 05/24/22 2104  Vitals shown include unvalidated device data.  Last Pain:  Vitals:   05/24/22 1828  TempSrc: Oral  PainSc:          Complications: No notable events documented.

## 2022-05-24 NOTE — Sepsis Progress Note (Signed)
Elink monitoring for the code sepsis protocol.  

## 2022-05-25 ENCOUNTER — Other Ambulatory Visit: Payer: Self-pay | Admitting: Physician Assistant

## 2022-05-25 ENCOUNTER — Encounter: Payer: Self-pay | Admitting: Internal Medicine

## 2022-05-25 DIAGNOSIS — A419 Sepsis, unspecified organism: Secondary | ICD-10-CM | POA: Diagnosis not present

## 2022-05-25 DIAGNOSIS — N201 Calculus of ureter: Secondary | ICD-10-CM

## 2022-05-25 LAB — BASIC METABOLIC PANEL
Anion gap: 6 (ref 5–15)
BUN: 20 mg/dL (ref 8–23)
CO2: 22 mmol/L (ref 22–32)
Calcium: 8.2 mg/dL — ABNORMAL LOW (ref 8.9–10.3)
Chloride: 109 mmol/L (ref 98–111)
Creatinine, Ser: 1.37 mg/dL — ABNORMAL HIGH (ref 0.44–1.00)
GFR, Estimated: 42 mL/min — ABNORMAL LOW (ref >60)
Glucose, Bld: 166 mg/dL — ABNORMAL HIGH (ref 70–99)
Potassium: 3.5 mmol/L (ref 3.5–5.1)
Sodium: 137 mmol/L (ref 135–145)

## 2022-05-25 LAB — HEMOGLOBIN A1C
Hgb A1c MFr Bld: 5.5 % (ref 4.8–5.6)
Mean Plasma Glucose: 111.15 mg/dL

## 2022-05-25 LAB — GLUCOSE, CAPILLARY
Glucose-Capillary: 157 mg/dL — ABNORMAL HIGH (ref 70–99)
Glucose-Capillary: 177 mg/dL — ABNORMAL HIGH (ref 70–99)

## 2022-05-25 LAB — PROTIME-INR
INR: 1.1 (ref 0.8–1.2)
Prothrombin Time: 14.6 seconds (ref 11.4–15.2)

## 2022-05-25 LAB — PHOSPHORUS: Phosphorus: 2.7 mg/dL (ref 2.5–4.6)

## 2022-05-25 LAB — CBC
HCT: 34.5 % — ABNORMAL LOW (ref 36.0–46.0)
Hemoglobin: 11.4 g/dL — ABNORMAL LOW (ref 12.0–15.0)
MCH: 30 pg (ref 26.0–34.0)
MCHC: 33 g/dL (ref 30.0–36.0)
MCV: 90.8 fL (ref 80.0–100.0)
Platelets: 265 10*3/uL (ref 150–400)
RBC: 3.8 MIL/uL — ABNORMAL LOW (ref 3.87–5.11)
RDW: 12.9 % (ref 11.5–15.5)
WBC: 11.3 10*3/uL — ABNORMAL HIGH (ref 4.0–10.5)
nRBC: 0 % (ref 0.0–0.2)

## 2022-05-25 LAB — PROCALCITONIN: Procalcitonin: 15.35 ng/mL

## 2022-05-25 LAB — HIV ANTIBODY (ROUTINE TESTING W REFLEX): HIV Screen 4th Generation wRfx: NONREACTIVE

## 2022-05-25 LAB — LACTIC ACID, PLASMA
Lactic Acid, Venous: 0.9 mmol/L (ref 0.5–1.9)
Lactic Acid, Venous: 2.1 mmol/L (ref 0.5–1.9)

## 2022-05-25 LAB — CORTISOL-AM, BLOOD: Cortisol - AM: 11.6 ug/dL (ref 6.7–22.6)

## 2022-05-25 LAB — MAGNESIUM: Magnesium: 1.9 mg/dL (ref 1.7–2.4)

## 2022-05-25 MED ORDER — BUPROPION HCL ER (XL) 150 MG PO TB24
300.0000 mg | ORAL_TABLET | Freq: Every day | ORAL | Status: DC
  Administered 2022-05-25 – 2022-05-26 (×2): 300 mg via ORAL
  Filled 2022-05-25 (×2): qty 2

## 2022-05-25 NOTE — Progress Notes (Signed)
[facility] Progress Note  Patient: Sheryl [name]    [id number]  DOA: 05/24/2022     Date of Service: the patient was seen and examined on 05/25/2022  Chief Complaint  Patient presents with   Flank Pain   Brief hospital course: Sheryl [name] is a 72 y.o. female with medical history significant of diabetes, essential hypertension, chronic kidney disease stage III, GERD, hyperlipidemia, recurrent nephrolithiasis who presented to the ER with left lower quadrant pain that has been going on since since Friday.  Associated with loss of appetite, nausea and fever.  Patient was seen by urology in the outpatient and referred to ER, CT scan showed left-sided hydronephrosis with 3 mm stone. ED workup, patient was septic, sepsis criteria fever, tachycardia, leukocytosis, lactic acid elevated, UTI/pyonephritis.  Patient was started on IV antibiotics, seen by urology and admitted for further management as below.   Assessment and Plan:  # sepsis due to pyelonephritis induced by ureteral stone urology consulted s/p left ureteral stent insertion done on 12/19 Continue ceftriaxone 2 g IV daily Lactate 0.9 and wbc trending down Continue IV fluid LR with K 40 mEq, decrease rate 75 ml/hr follow urine and blood culture    # infected nephrolithiasis: Patient will probably need definitive treatment as an outpatient, urology is following.   # diabetes type 2: Non-insulin-dependent.  Initiate sliding scale insulin and monitor.   # Hypertension: Continue propranolol ER 60 mg p.o. daily home dose Monitor BP and titrate medications accordingly  home regimen.   # hypokalemia: Most likely from nausea and some vomiting. Continue to replete potassium.    # hyperlipidemia:  resumes home statin.   # depression and anxiety: On trazodone and Cymbalta.    Body mass index is 30.81 kg/m.  Interventions:   Diet: Carb modified diet DVT Prophylaxis: Subcutaneous Lovenox   Advance goals of care  discussion: Full code  Family Communication: family was present at bedside, at the time of interview.  The pt provided permission to discuss medical plan with the family. Opportunity was given to ask question and all questions were answered satisfactorily.   Disposition:  Pt is from Home, admitted with pyelonephritis, left ureteral stone, still on IV antibiotics, urine culture and blood culture pending, which precludes a safe discharge. Discharge to home, when clinically stable.  Discharge in 1 to 2 days  Subjective: No significant events overnight, patient still feels weak and tired and slightly confused, abdominal pain is 1-2/10 well-controlled.  Denies any nausea vomiting, no diarrhea.  He denies any chest pain or palpitations, no shortness of breath.   Physical Exam: General: NAD, lying comfortably Appear in no distress, affect appropriate Eyes: PERRLA ENT: Oral Mucosa Clear, moist  Neck: no JVD,  Cardiovascular: S1 and S2 Present, no Murmur,  Respiratory: good respiratory effort, Bilateral Air entry equal and Decreased, no Crackles, no wheezes Abdomen: Bowel Sound present, Soft and mild left flank tenderness,  Skin: no rashes Extremities: no Pedal edema, no calf tenderness Neurologic: without any new focal findings Gait not checked due to patient safety concerns  Vitals:   05/25/22 0006 05/25/22 0400 05/25/22 0439 05/25/22 0828  BP: (!) 116/59  109/63 (!) 122/58  Pulse: 86  90 91  Resp: 18  20 18  $ Temp: (!) 97.5 F (36.4 C)  97.8 F (36.6 C) 97.6 F (36.4 C)  TempSrc:   Oral Oral  SpO2: 97%  100% 100%  Weight:  76.4 kg    Height:  5' 2"$  (1.575 m)  Intake/Output Summary (Last 24 hours) at 05/25/2022 1200 Last data filed at 05/25/2022 0600 Gross per 24 hour  Intake 3045.14 ml  Output 0 ml  Net 3045.14 ml   Filed Weights   05/25/22 0400  Weight: 76.4 kg    Data Reviewed: I have personally reviewed and interpreted daily labs, tele strips, imagings as discussed  above. I reviewed all nursing notes, pharmacy notes, vitals, pertinent old records I have discussed plan of care as described above with RN and patient/family.  CBC: Recent Labs  Lab 05/24/22 1727 05/24/22 2309 05/25/22 0259  WBC 15.6* 13.8* 11.3*  NEUTROABS 13.6*  --   --   HGB 13.1 11.4* 11.4*  HCT 38.6 33.6* 34.5*  MCV 88.7 88.4 90.8  PLT 295 254 [id number]   Basic Metabolic Panel: Recent Labs  Lab 05/24/22 1727 05/24/22 2309 05/25/22 0255  NA 133*  --  137  K 2.9*  --  3.5  CL 101  --  109  CO2 21*  --  22  GLUCOSE 148*  --  166*  BUN 21  --  20  CREATININE 1.51* 1.32* 1.37*  CALCIUM 8.8*  --  8.2*  MG  --   --  1.9  PHOS  --   --  2.7    Studies: DG OR UROLOGY CYSTO IMAGE ([facility] ONLY)  Result Date: 05/24/2022 There is no interpretation for this exam.  This order is for images obtained during a surgical procedure.  Please See "Surgeries" Tab for more information regarding the procedure.   DG Chest 2 View  Result Date: 05/24/2022 CLINICAL DATA:  Fever flank pain EXAM: CHEST - 2 VIEW COMPARISON:  11/20/2021 report FINDINGS: No acute airspace disease or effusion. Normal cardiomediastinal silhouette. Aortic atherosclerosis. No pneumothorax. IMPRESSION: No active cardiopulmonary disease. Electronically Signed   By: Donavan Foil M.D.   On: 05/24/2022 18:29   CT RENAL STONE STUDY  Result Date: 05/24/2022 CLINICAL DATA:  Right lower quadrant of the nominal pain. History of renal stones. EXAM: CT ABDOMEN AND PELVIS WITHOUT CONTRAST TECHNIQUE: Multidetector CT imaging of the abdomen and pelvis was performed following the standard protocol without IV contrast. RADIATION DOSE REDUCTION: This exam was performed according to the departmental dose-optimization program which includes automated exposure control, adjustment of the mA and/or kV according to patient size and/or use of iterative reconstruction technique. COMPARISON:  CT December 10, 2018 FINDINGS: Lower chest: Moderate-sized  hiatal hernia. Hepatobiliary: Unremarkable noncontrast enhanced appearance of the hepatic parenchyma. Gallbladder surgically absent. No biliary ductal dilation. Pancreas: No pancreatic ductal dilation or evidence of acute inflammation. Spleen: No splenomegaly. Adrenals/Urinary Tract: Bilateral adrenal glands are within normal limits. Left-sided perinephric/periureteric stranding with hydroureteronephrosis to a 3 mm stone in the distal left ureter between the pelvic inlet in the UVJ on image 94/4. Additional nonobstructive bilateral renal calculi measure up to 10 mm in the left renal pelvis. Hypodense 4 cm right renal lesion measures fluid density compatible with a cyst and is considered benign requiring no independent imaging follow-up. Urinary bladder is unremarkable for degree of distension within limitation of streak artifact from right hip arthroplasty. Stomach/Bowel: Moderate-sized hiatal hernia. No pathologic dilation of small or large bowel. Colonic diverticulosis without findings of acute diverticulitis. Vascular/Lymphatic: Aortic atherosclerosis. Smooth IVC contours. No pathologically enlarged abdominal or pelvic lymph nodes. Reproductive: Unchanged size of the 2 cm right ovarian cyst, considered benign and requiring no independent imaging follow-up. Other: No significant abdominopelvic free fluid. Musculoskeletal: Streak artifact from right hip arthroplasty. Diffuse demineralization  of bone. Multilevel degenerative changes spine. IMPRESSION: 1. Left-sided perinephric/periureteric stranding with hydroureteronephrosis to a 3 mm stone in the distal left ureter between the pelvic inlet in the UVJ, suggest correlation with urinalysis to exclude superimposed on infection. 2. Additional nonobstructive bilateral renal calculi measure up to 10 mm in the left renal pelvis. 3. Moderate-sized hiatal hernia. 4. Colonic diverticulosis without findings of acute diverticulitis. 5.  Aortic Atherosclerosis (ICD10-I70.0).  Electronically Signed   By: Dahlia Bailiff M.D.   On: 05/24/2022 13:43    Scheduled Meds:  buPROPion  300 mg Oral Daily   DULoxetine  60 mg Oral Daily   enoxaparin (LOVENOX) injection  40 mg Subcutaneous Q24H   insulin aspart  0-15 Units Subcutaneous TID WC   insulin aspart  0-5 Units Subcutaneous QHS   pantoprazole  40 mg Oral Daily   propranolol ER  60 mg Oral Daily   rosuvastatin  5 mg Oral QHS   traZODone  50 mg Oral QHS   Continuous Infusions:  cefTRIAXone (ROCEPHIN)  IV     lactated ringers 1,000 mL with potassium chloride 40 mEq infusion 125 mL/hr at 05/25/22 0353   PRN Meds: morphine injection, ondansetron **OR** ondansetron (ZOFRAN) IV  Time spent: 35 minutes  Author: Val Riles. MD [facility] 05/25/2022 12:00 PM  To reach On-call, see care teams to locate the attending and reach out to them via [email or web address]. If 7PM-7AM, please contact night-coverage If you still have difficulty reaching the attending provider, please page the [facility] (Director on Call) for [facility] on amion for assistance.

## 2022-05-25 NOTE — Progress Notes (Signed)
   05/24/22 2256  Assess: MEWS Score  Temp 98.1 F (36.7 C)  BP 128/65  MAP (mmHg) 83  Pulse Rate 96  Resp 16  SpO2 97 %  O2 Device Room Air  Assess: MEWS Score  MEWS Temp 0  MEWS Systolic 0  MEWS Pulse 0  MEWS RR 0  MEWS LOC 0  MEWS Score 0  MEWS Score Color Green  Notify: Charge Nurse/RN  Name of Charge Nurse/RN Notified Jodie, RN  Assess: SIRS CRITERIA  SIRS Temperature  0  SIRS Pulse 1  SIRS Respirations  0  SIRS WBC 0  SIRS Score Sum  1

## 2022-05-25 NOTE — Progress Notes (Signed)
Urology Inpatient Progress Note  Subjective: No acute events overnight.  She is afebrile, VSS this morning. Lactate normalized, 0.9.  WBC count down, 11.3.  Procalcitonin 15.35.  Creatinine down, 1.37. Urine culture pending, blood cultures pending with no growth at <12 hours.  On antibiotics as below. Today she reports some LLQ discomfort and gross hematuria this morning, but overall feels better since undergoing stent placement.  Her urinary urgency and frequency have significantly improved.  Anti-infectives: Anti-infectives (From admission, onward)    Start     Dose/Rate Route Frequency Ordered Stop   05/25/22 1800  cefTRIAXone (ROCEPHIN) 2 g in sodium chloride 0.9 % 100 mL IVPB        2 g 200 mL/hr over 30 Minutes Intravenous Every 24 hours 05/24/22 2244 05/31/22 1759   05/24/22 1830  cefTRIAXone (ROCEPHIN) 2 g in sodium chloride 0.9 % 100 mL IVPB        2 g 200 mL/hr over 30 Minutes Intravenous  Once 05/24/22 1815 05/24/22 1857       Current Facility-Administered Medications  Medication Dose Route Frequency Provider Last Rate Last Admin   buPROPion (WELLBUTRIN XL) 24 hr tablet 300 mg  300 mg Oral Daily Dallie Piles, RPH       cefTRIAXone (ROCEPHIN) 2 g in sodium chloride 0.9 % 100 mL IVPB  2 g Intravenous Q24H Jonelle Sidle, Mohammad L, MD       DULoxetine (CYMBALTA) DR capsule 60 mg  60 mg Oral Daily Gala Romney L, MD   60 mg at 05/25/22 0943   enoxaparin (LOVENOX) injection 40 mg  40 mg Subcutaneous Q24H Gala Romney L, MD   40 mg at 05/25/22 0946   insulin aspart (novoLOG) injection 0-15 Units  0-15 Units Subcutaneous TID WC Elwyn Reach, MD   3 Units at 05/25/22 0941   insulin aspart (novoLOG) injection 0-5 Units  0-5 Units Subcutaneous QHS Jonelle Sidle, Mohammad L, MD       lactated ringers 1,000 mL with potassium chloride 40 mEq infusion   Intravenous Continuous Elwyn Reach, MD 125 mL/hr at 05/25/22 0353 New Bag at 05/25/22 0353   morphine (PF) 2 MG/ML injection 2 mg  2  mg Intravenous Q3H PRN Elwyn Reach, MD       ondansetron (ZOFRAN) tablet 4 mg  4 mg Oral Q6H PRN Elwyn Reach, MD       Or   ondansetron (ZOFRAN) injection 4 mg  4 mg Intravenous Q6H PRN Elwyn Reach, MD       pantoprazole (PROTONIX) EC tablet 40 mg  40 mg Oral Daily Gala Romney L, MD   40 mg at 05/25/22 0943   propranolol ER (INDERAL LA) 24 hr capsule 60 mg  60 mg Oral Daily Val Riles, MD   60 mg at 05/25/22 0944   rosuvastatin (CRESTOR) tablet 5 mg  5 mg Oral QHS Elwyn Reach, MD       traZODone (DESYREL) tablet 50 mg  50 mg Oral QHS Elwyn Reach, MD         Objective: Vital signs in last 24 hours: Temp:  [97.5 F (36.4 C)-100.1 F (37.8 C)] 97.6 F (36.4 C) (02/20 0828) Pulse Rate:  [86-131] 91 (02/20 0828) Resp:  [16-25] 18 (02/20 0828) BP: (109-152)/(46-85) 122/58 (02/20 0828) SpO2:  [94 %-100 %] 100 % (02/20 0828) Weight:  [76.4 kg] 76.4 kg (02/20 0400)  Intake/Output from previous day: 02/19 0701 - 02/20 0700 In: 3045.1 [P.O.:240; I.V.:1106.1; IV  [id number] Out: 0  Intake/Output this shift: No intake/output data recorded.  Physical Exam Vitals and nursing note reviewed.  Constitutional:      General: She is not in acute distress.    Appearance: She is not ill-appearing, toxic-appearing or diaphoretic.  HENT:     Head: Normocephalic and atraumatic.  Pulmonary:     Effort: Pulmonary effort is normal. No respiratory distress.  Skin:    General: Skin is warm and dry.  Neurological:     Mental Status: She is alert and oriented to person, place, and time.  Psychiatric:        Mood and Affect: Mood normal.        Behavior: Behavior normal.    Lab Results:  Recent Labs    05/24/22 2309 05/25/22 0259  WBC 13.8* 11.3*  HGB 11.4* 11.4*  HCT 33.6* 34.5*  PLT 254 265   BMET Recent Labs    05/24/22 1727 05/24/22 2309 05/25/22 0255  NA 133*  --  137  K 2.9*  --  3.5  CL 101  --  109  CO2 21*  --  22  GLUCOSE 148*  --   166*  BUN 21  --  20  CREATININE 1.51* 1.32* 1.37*  CALCIUM 8.8*  --  8.2*   PT/INR Recent Labs    05/25/22 0259  LABPROT 14.6  INR 1.1   Assessment & Plan: 72 year old female with PMH nephrolithiasis admitted with urosepsis due to an obstructing 3 mm distal left ureteral stone, now POD 1 from left ureteral stent placement with Dr. Erlene Quan.  She is clinically improving today on empiric antibiotics.  She is having some mild stent discomfort.  We discussed common stent symptoms including flank pain, bladder pain, dysuria, urgency, frequency, and gross hematuria.  We discussed that she will require outpatient left ureteroscopy with laser lithotripsy and stent exchange in 2 to 3 weeks after she completes a course of culture appropriate antibiotics.  She expressed understanding.  Recommendations: -Continue antibiotics and follow cultures.  She will require a total of 14 days of culture appropriate therapy. -Consider Flomax 0.4 mg daily for management of stent discomfort -Outpatient left URS/LL/stent exchange in 2 to 3 weeks.  Our surgical scheduler will contact her upon discharge to arrange.  Debroah Loop, PA-C 05/25/2022

## 2022-05-25 NOTE — Progress Notes (Unsigned)
Surgical Physician [facility]  Dr. Erlene Quan * Scheduling expectation :  2-3 weeks  *Length of Case:   *Clearance needed: no  *Anticoagulation Instructions: N/A  *Aspirin Instructions: N/A  *Post-op visit Date/Instructions:   TBD  *Diagnosis: Left Ureteral Stone  *Procedure: left  Ureteroscopy w/laser lithotripsy & stent exchange [id number])   Additional orders: N/A  -Admit type: OUTpatient  -Anesthesia: General  -VTE Prophylaxis Standing Order SCD's       Other:   -Standing Lab Orders Per Anesthesia    Lab other: None  -Standing Test orders EKG/Chest x-ray per Anesthesia       Test other:   - Medications:  Ancef 2gm IV  -Other orders:  N/A

## 2022-05-26 ENCOUNTER — Telehealth: Payer: Self-pay

## 2022-05-26 DIAGNOSIS — A419 Sepsis, unspecified organism: Secondary | ICD-10-CM | POA: Diagnosis not present

## 2022-05-26 LAB — URINE CULTURE: Culture: >=100000 — AB

## 2022-05-26 LAB — BASIC METABOLIC PANEL
Anion gap: 6 (ref 5–15)
BUN: 25 mg/dL — ABNORMAL HIGH (ref 8–23)
CO2: 21 mmol/L — ABNORMAL LOW (ref 22–32)
Calcium: 8.8 mg/dL — ABNORMAL LOW (ref 8.9–10.3)
Chloride: 110 mmol/L (ref 98–111)
Creatinine, Ser: 1.2 mg/dL — ABNORMAL HIGH (ref 0.44–1.00)
GFR, Estimated: 49 mL/min — ABNORMAL LOW (ref >60)
Glucose, Bld: 99 mg/dL (ref 70–99)
Potassium: 3.8 mmol/L (ref 3.5–5.1)
Sodium: 137 mmol/L (ref 135–145)

## 2022-05-26 LAB — GLUCOSE, CAPILLARY
Glucose-Capillary: 119 mg/dL — ABNORMAL HIGH (ref 70–99)
Glucose-Capillary: 107 mg/dL — ABNORMAL HIGH (ref 70–99)
Glucose-Capillary: 132 mg/dL — ABNORMAL HIGH (ref 70–99)
Glucose-Capillary: 84 mg/dL (ref 70–99)

## 2022-05-26 LAB — PHOSPHORUS: Phosphorus: 2.6 mg/dL (ref 2.5–4.6)

## 2022-05-26 LAB — CBC
HCT: 33.2 % — ABNORMAL LOW (ref 36.0–46.0)
Hemoglobin: 11.1 g/dL — ABNORMAL LOW (ref 12.0–15.0)
MCH: 30.2 pg (ref 26.0–34.0)
MCHC: 33.4 g/dL (ref 30.0–36.0)
MCV: 90.2 fL (ref 80.0–100.0)
Platelets: 287 10*3/uL (ref 150–400)
RBC: 3.68 MIL/uL — ABNORMAL LOW (ref 3.87–5.11)
RDW: 13.3 % (ref 11.5–15.5)
WBC: 12.5 10*3/uL — ABNORMAL HIGH (ref 4.0–10.5)
nRBC: 0 % (ref 0.0–0.2)

## 2022-05-26 LAB — MAGNESIUM: Magnesium: 1.8 mg/dL (ref 1.7–2.4)

## 2022-05-26 MED ORDER — AMOXICILLIN-POT CLAVULANATE 875-125 MG PO TABS: 1.0000 | ORAL_TABLET | Freq: Two times a day (BID) | ORAL | 0 refills | Status: AC

## 2022-05-26 NOTE — Discharge Summary (Signed)
[facility] Discharge Summary   Patient: Sheryl [name] [id number]  PCP: Latanya Maudlin, NP  Date of admission: 05/24/2022   Date of discharge:  05/26/2022     Discharge Diagnoses:  Principal Problem:   Sepsis ([place]) Active Problems:   Anxiety   Diabetes mellitus type 2, uncomplicated ([place])   Hydronephrosis   Hypertension   Ureteric stone   Kidney stone on left side   Hypokalemia   Admitted From: Home Disposition:  Home   Recommendations for Outpatient Follow-up:  PCP: In 1 week Follow-up with urologist in 1 week Follow up LABS/TEST: CBC and BMP in 1 week   Diet recommendation: Carb modified diet  Activity: The patient is advised to gradually reintroduce usual activities, as tolerated  Discharge Condition: stable  Code Status: Full code   History of present illness: As per the H and P dictated on admission Hospital Course:  Sheryl [name] is a 72 y.o. female with medical history significant of diabetes, essential hypertension, chronic kidney disease stage III, GERD, hyperlipidemia, recurrent nephrolithiasis who presented to the ER with left lower quadrant pain that has been going on since since Friday.  Associated with loss of appetite, nausea and fever.  Patient was seen by urology in the outpatient and referred to ER, CT scan showed left-sided hydronephrosis with 3 mm stone. ED workup, patient was septic, sepsis criteria fever, tachycardia, leukocytosis, lactic acid elevated, UTI/pyonephritis.  Patient was started on IV antibiotics, seen by urology and admitted for further management as below. Assessment and Plan: # sepsis due to pyelonephritis induced by ureteral stone urology consulted s/p left ureteral stent insertion done on 12/19. S/p ceftriaxone 2 g IV daily. Lactate 0.9 and wbc trending down. S/p IV fluid LR with K 40 mEq, decrease rate 75 ml/hr. urine culture growing Proteus Mirabella's, pansensitive.  Patient was discharged on p.o. Augmentin twice daily  for 12 days to complete 2 weeks course.  Patient was recommended to follow with urology in 1 week. # infected nephrolithiasis: Patient will probably need definitive treatment as an outpatient, urology is following. # Diabetes type 2: Non-insulin-dependent. S/p ISS, resumed home medications on discharge. # Hypertension: Continue propranolol ER 60 mg p.o. daily home dose, lisinopril and hydrochlorothiazide was held during hospital stay, resumed on discharge.  Patient was advised to monitor BP at home and follow with PCP to titrate medications accordingly. # hypokalemia: Most likely from nausea and some vomiting.  Potassium repleted.  Resolved. # hyperlipidemia:  resumed home statin. # depression and anxiety: resumed trazodone and Cymbalta.    Body mass index is 30.81 kg/m.  Interventions:  Patient was ambulatory without any assistance. On the day of the discharge the patient's vitals were stable, and no other acute medical condition were reported by patient. the patient was felt safe to be discharge at Home.  Consultants: Urologist Procedures: Left ureteral stent placement and Left retrograde pyelogram  Discharge Exam: General: Appear in no distress, no Rash; Oral Mucosa Clear, moist. Cardiovascular: S1 and S2 Present, no Murmur, Respiratory: normal respiratory effort, Bilateral Air entry present and no Crackles, no wheezes Abdomen: Bowel Sound present, Soft and no tenderness, no hernia Extremities: no Pedal edema, no calf tenderness Neurology: alert and oriented to time, place, and person affect appropriate.  Filed Weights   05/25/22 0400  Weight: 76.4 kg   Vitals:   05/26/22 0625 05/26/22 0839  BP: 120/61 (!) 121/54  Pulse: 71 79  Resp:  16  Temp: 98.3 F (36.8 C) 98.3 F (  36.8 C)  SpO2: 99% 98%    DISCHARGE MEDICATION: Allergies as of 05/26/2022   No Known Allergies      Medication List     TAKE these medications    acetaminophen 500 MG tablet Commonly known as:  TYLENOL Take 2 tablets (1,000 mg total) by mouth every 8 (eight) hours.   amoxicillin-clavulanate 875-125 MG tablet Commonly known as: AUGMENTIN Take 1 tablet by mouth 2 (two) times daily for 12 days.   buPROPion 300 MG 24 hr tablet Commonly known as: WELLBUTRIN XL Take 300 mg by mouth daily.   cyanocobalamin 1000 MCG tablet Commonly known as: VITAMIN B12 Take 1,000 mcg by mouth daily.   DULoxetine 60 MG capsule Commonly known as: CYMBALTA Take 60 mg by mouth daily.   lisinopril-hydrochlorothiazide 20-25 MG tablet Commonly known as: ZESTORETIC Take 0.5 tablets by mouth daily.   meloxicam 15 MG tablet Commonly known as: MOBIC Take 15 mg by mouth daily as needed for pain.   metFORMIN 850 MG tablet Commonly known as: GLUCOPHAGE Take 850 mg by mouth 2 (two) times daily with a meal.   pantoprazole 40 MG tablet Commonly known as: PROTONIX Take 40 mg by mouth daily.   potassium chloride 8 MEQ tablet Commonly known as: KLOR-CON 8 mEq daily.   propranolol ER 60 MG 24 hr capsule Commonly known as: INDERAL LA Take 60 mg by mouth daily.   rosuvastatin 5 MG tablet Commonly known as: CRESTOR Take 5 mg by mouth at bedtime.   traMADol 50 MG tablet Commonly known as: ULTRAM Take 1 tablet (50 mg total) by mouth every 6 (six) hours as needed for moderate pain.   traZODone 50 MG tablet Commonly known as: DESYREL Take 50 mg by mouth at bedtime.   Vitamin D3 50 MCG (2000 UT) Caps Generic drug: Cholecalciferol Take 2,000 Units by mouth daily.       No Known Allergies Discharge Instructions     Call MD for:  difficulty breathing, headache or visual disturbances   Complete by: As directed    Call MD for:  extreme fatigue   Complete by: As directed    Call MD for:  persistant dizziness or light-headedness   Complete by: As directed    Call MD for:  persistant nausea and vomiting   Complete by: As directed    Call MD for:  severe uncontrolled pain   Complete by: As  directed    Call MD for:  temperature >100.4   Complete by: As directed    Diet - low sodium heart healthy   Complete by: As directed    Discharge instructions   Complete by: As directed    Follow-up with PCP in 1 week, CBC and BMP after 1 week.  Follow-up PCP for lung cancer screening as an outpatient Follow with urology in 1 week for left ureteral stent and calculi.   Increase activity slowly   Complete by: As directed        The results of significant diagnostics from this hospitalization (including imaging, microbiology, ancillary and laboratory) are listed below for reference.    Significant Diagnostic Studies: DG OR UROLOGY CYSTO IMAGE ([facility] ONLY)  Result Date: 05/24/2022 There is no interpretation for this exam.  This order is for images obtained during a surgical procedure.  Please See "Surgeries" Tab for more information regarding the procedure.   DG Chest 2 View  Result Date: 05/24/2022 CLINICAL DATA:  Fever flank pain EXAM: CHEST - 2 VIEW COMPARISON:  11/20/2021 report FINDINGS: No acute airspace disease or effusion. Normal cardiomediastinal silhouette. Aortic atherosclerosis. No pneumothorax. IMPRESSION: No active cardiopulmonary disease. Electronically Signed   By: Donavan Foil M.D.   On: 05/24/2022 18:29   Abdomen 1 view (KUB)  Result Date: 05/24/2022 CLINICAL DATA:  Abdominal pain EXAM: ABDOMEN - 1 VIEW COMPARISON:  Same day CT of the abdomen pelvis. FINDINGS: The bowel gas pattern is normal. Distal left ureteral stone seen on same day CT not confidently identified on this examination. Bilateral stones project over the renal shadows measuring up to 11 mm on the left and 9 mm on the right. Gallbladder surgically absent. Right hip arthroplasty. Demineralization of bone. Multilevel degenerative changes spine. Benign bone island in the right iliac bone. IMPRESSION: 1. Distal left ureteral stone seen on same day CT not confidently identified on this examination. 2. Bilateral  nephrolithiasis. Electronically Signed   By: Dahlia Bailiff M.D.   On: 05/24/2022 13:44   CT RENAL STONE STUDY  Result Date: 05/24/2022 CLINICAL DATA:  Right lower quadrant of the nominal pain. History of renal stones. EXAM: CT ABDOMEN AND PELVIS WITHOUT CONTRAST TECHNIQUE: Multidetector CT imaging of the abdomen and pelvis was performed following the standard protocol without IV contrast. RADIATION DOSE REDUCTION: This exam was performed according to the departmental dose-optimization program which includes automated exposure control, adjustment of the mA and/or kV according to patient size and/or use of iterative reconstruction technique. COMPARISON:  CT December 10, 2018 FINDINGS: Lower chest: Moderate-sized hiatal hernia. Hepatobiliary: Unremarkable noncontrast enhanced appearance of the hepatic parenchyma. Gallbladder surgically absent. No biliary ductal dilation. Pancreas: No pancreatic ductal dilation or evidence of acute inflammation. Spleen: No splenomegaly. Adrenals/Urinary Tract: Bilateral adrenal glands are within normal limits. Left-sided perinephric/periureteric stranding with hydroureteronephrosis to a 3 mm stone in the distal left ureter between the pelvic inlet in the UVJ on image 94/4. Additional nonobstructive bilateral renal calculi measure up to 10 mm in the left renal pelvis. Hypodense 4 cm right renal lesion measures fluid density compatible with a cyst and is considered benign requiring no independent imaging follow-up. Urinary bladder is unremarkable for degree of distension within limitation of streak artifact from right hip arthroplasty. Stomach/Bowel: Moderate-sized hiatal hernia. No pathologic dilation of small or large bowel. Colonic diverticulosis without findings of acute diverticulitis. Vascular/Lymphatic: Aortic atherosclerosis. Smooth IVC contours. No pathologically enlarged abdominal or pelvic lymph nodes. Reproductive: Unchanged size of the 2 cm right ovarian cyst, considered  benign and requiring no independent imaging follow-up. Other: No significant abdominopelvic free fluid. Musculoskeletal: Streak artifact from right hip arthroplasty. Diffuse demineralization of bone. Multilevel degenerative changes spine. IMPRESSION: 1. Left-sided perinephric/periureteric stranding with hydroureteronephrosis to a 3 mm stone in the distal left ureter between the pelvic inlet in the UVJ, suggest correlation with urinalysis to exclude superimposed on infection. 2. Additional nonobstructive bilateral renal calculi measure up to 10 mm in the left renal pelvis. 3. Moderate-sized hiatal hernia. 4. Colonic diverticulosis without findings of acute diverticulitis. 5.  Aortic Atherosclerosis (ICD10-I70.0). Electronically Signed   By: Dahlia Bailiff M.D.   On: 05/24/2022 13:43    Microbiology: Recent Results (from the past 240 hour(s))  Urine Culture     Status: Abnormal   Collection Time: 05/24/22  5:27 PM   Specimen: Urine, Random  Result Value Ref Range Status   Specimen Description   Final    URINE, RANDOM Performed at [facility], [address] [place], [place] [address]    Special Requests   Final  NONE Performed at [facility], [address] [place], [place] [address]    Culture >=100,000 COLONIES/mL PROTEUS MIRABILIS (A)  Final   Report Status 05/26/2022 FINAL  Final   Organism ID, Bacteria PROTEUS MIRABILIS (A)  Final      Susceptibility   Proteus mirabilis - MIC*    AMPICILLIN <=2 SENSITIVE Sensitive     CEFAZOLIN 8 SENSITIVE Sensitive     CEFEPIME <=0.12 SENSITIVE Sensitive     CEFTRIAXONE <=0.25 SENSITIVE Sensitive     CIPROFLOXACIN <=0.25 SENSITIVE Sensitive     GENTAMICIN <=1 SENSITIVE Sensitive     IMIPENEM 4 SENSITIVE Sensitive     NITROFURANTOIN 128 RESISTANT Resistant     TRIMETH/SULFA <=20 SENSITIVE Sensitive     AMPICILLIN/SULBACTAM <=2 SENSITIVE Sensitive     PIP/TAZO <=4 SENSITIVE Sensitive     * >=100,000 COLONIES/mL PROTEUS  MIRABILIS  Culture, blood (Routine x 2)     Status: None (Preliminary result)   Collection Time: 05/24/22  5:27 PM   Specimen: BLOOD  Result Value Ref Range Status   Specimen Description BLOOD BLOOD LEFT ARM  Final   Special Requests   Final    BOTTLES DRAWN AEROBIC AND ANAEROBIC Blood Culture adequate volume   Culture   Final    NO GROWTH < 12 HOURS Performed at [facility], [address] [place], [place] [address]    Report Status PENDING  Incomplete  Culture, blood (Routine x 2)     Status: None (Preliminary result)   Collection Time: 05/24/22  6:22 PM   Specimen: BLOOD  Result Value Ref Range Status   Specimen Description BLOOD BLOOD LEFT ARM  Final   Special Requests   Final    BOTTLES DRAWN AEROBIC AND ANAEROBIC Blood Culture adequate volume   Culture   Final    NO GROWTH < 12 HOURS Performed at [facility], [address] [place], [place] [address]    Report Status PENDING  Incomplete  Urine Culture (for pregnant, neutropenic or urologic patients or patients with an indwelling urinary catheter)     Status: Abnormal   Collection Time: 05/24/22  7:24 PM   Specimen: Urine, Clean Catch  Result Value Ref Range Status   Specimen Description   Final    URINE, CLEAN CATCH Performed at [facility], [address], [place], [place] [address]    Special Requests   Final    NONE Performed at [facility], [address] [place], [place] [address]    Culture MULTIPLE SPECIES PRESENT, SUGGEST RECOLLECTION (A)  Final   Report Status 05/26/2022 FINAL  Final     Labs: CBC: Recent Labs  Lab 05/24/22 1727 05/24/22 2309 05/25/22 0259 05/26/22 0548  WBC 15.6* 13.8* 11.3* 12.5*  NEUTROABS 13.6*  --   --   --   HGB 13.1 11.4* 11.4* 11.1*  HCT 38.6 33.6* 34.5* 33.2*  MCV 88.7 88.4 90.8 90.2  PLT 295 254 265 [id number]   Basic Metabolic Panel: Recent Labs  Lab 05/24/22 1727 05/24/22 2309 05/25/22 0255 05/26/22 0548  NA 133*  --  137  137  K 2.9*  --  3.5 3.8  CL 101  --  109 110  CO2 21*  --  22 21*  GLUCOSE 148*  --  166* 99  BUN 21  --  20 25*  CREATININE 1.51* 1.32* 1.37* 1.20*  CALCIUM 8.8*  --  8.2* 8.8*  MG  --   --  1.9 1.8  PHOS  --   --  2.7 2.6   Liver Function Tests: Recent Labs  Lab 05/24/22 1727  AST 34  ALT 18  ALKPHOS 74  BILITOT 0.8  PROT 6.9  ALBUMIN 3.4*   No results for input(s): "LIPASE", "AMYLASE" in the last 168 hours. No results for input(s): "AMMONIA" in the last 168 hours. Cardiac Enzymes: No results for input(s): "CKTOTAL", "CKMB", "CKMBINDEX", "TROPONINI" in the last 168 hours. BNP (last 3 results) No results for input(s): "BNP" in the last 8760 hours. CBG: Recent Labs  Lab 05/25/22 0823 05/25/22 1152 05/25/22 1632 05/25/22 2117 05/26/22 0844  GLUCAP 157* 177* 119* 107* 132*    Time spent: 35 minutes  Signed:  Val Riles  [facility] 05/26/2022 11:21 AM

## 2022-05-26 NOTE — Progress Notes (Signed)
   [facility] From  Surgery Date: Date: 06/07/2022  Surgeon: Dr. Hollice Espy, MD  Inpt ( No  )   Outpt (Yes)   Obs ( No  )   Diagnosis: N20.1 Left Ureteral Stone  -CPT: [phone]  Surgery: Left Ureteroscopy with Laser Lithotripsy and Stent Exchange  Stop Anticoagulations: No  Cardiac/Medical/Pulmonary Clearance needed: no  *Orders entered into EPIC  Date: 05/26/22   *Case booked in [place]  Date: 05/26/22  *Notified pt of Surgery: Date: 05/26/22  PRE-OP UA & CX: no  *Placed into Prior Authorization Work [place] Date: 05/26/22  Assistant/laser/rep:No

## 2022-05-26 NOTE — Care Management Important Message (Signed)
Important Message  Patient Details  Name: Sheryl [name] MRN: [id number] Date of Birth: [birth year]   Medicare Important Message Given:  N/A - LOS <3 / Initial given by admissions     [name] 05/26/2022, 12:00 PM

## 2022-05-26 NOTE — Telephone Encounter (Signed)
I spoke with Sheryl [name]. We have discussed possible surgery dates and Monday March 4th, 2024 was agreed upon by all parties. Patient given information about surgery date, what to expect pre-operatively and post operatively.  We discussed that a Pre-Admission Testing office will be calling to set up the pre-op visit that will take place prior to surgery, and that these appointments are typically done over the phone with a Pre-Admissions RN. Informed patient that our office will communicate any additional care to be provided after surgery. Patients questions or concerns were discussed during our call. Advised to call our office should there be any additional information, questions or concerns that arise. Patient verbalized understanding.

## 2022-05-26 NOTE — TOC Initial Note (Signed)
Transition of Care [facility]) - Initial/Assessment Note    Patient Details  Name: MARGIA SCHMIDT MRN: [id number] Date of Birth: September 10, 1950  Transition of Care [facility]) CM/SW Contact:    Beverly Sessions, RN Phone Number: 05/26/2022, 12:20 PM  Clinical Narrative:                   Transition of Care [facility]) Screening Note   Patient Details  Name: GLENNYS SHIRAH Date of Birth: [birth year]   Transition of Care [facility]) CM/SW Contact:    Beverly Sessions, RN Phone Number: 05/26/2022, 12:20 PM    Transition of Care Department [facility]) has reviewed patient and no TOC needs have been identified at this time. We will continue to monitor patient advancement through interdisciplinary progression rounds. If new patient transition needs arise, please place a TOC consult.         Patient Goals and CMS Choice            Expected Discharge Plan and Services         Expected Discharge Date: 05/26/22                                    Prior Living Arrangements/Services                       Activities of Daily Living Home Assistive Devices/Equipment: Eyeglasses, Kasandra Knudsen (specify quad or straight) ADL Screening (condition at time of admission) Patient's cognitive ability adequate to safely complete daily activities?: Yes Is the patient deaf or have difficulty hearing?: No Does the patient have difficulty seeing, even when wearing glasses/contacts?: No Does the patient have difficulty concentrating, remembering, or making decisions?: No Patient able to express need for assistance with ADLs?: Yes Does the patient have difficulty dressing or bathing?: No Independently performs ADLs?: Yes (appropriate for developmental age) Does the patient have difficulty walking or climbing stairs?: No Weakness of Legs: None Weakness of Arms/Hands: None  Permission Sought/Granted                  Emotional Assessment              Admission diagnosis:  Sepsis ([place])  [A41.9] Patient Active Problem List   Diagnosis Date Noted   Sepsis ([place]) 05/24/2022   Hypokalemia 05/24/2022   Osteoarthritis of right hip 03/18/2022   Problems with swallowing and mastication    Diarrhea    Esophageal dysphagia    Gastritis without bleeding    Stricture and stenosis of esophagus    Breast mass, left 07/24/2019   Obesity (BMI 30.0-34.9) 06/19/2019   Chronic left shoulder pain 06/12/2019   Osteopenia of multiple sites 06/12/2019   Palpitations 04/16/2019   Vitamin D deficiency 12/14/2018   Hx of lithotripsy 12/13/2018   Kidney stone on left side 05/22/2018   Degenerative tear of glenoid labrum of right shoulder 01/21/2017   Biceps tendinitis of right upper extremity 01/21/2017   Urine test positive for microalbuminuria 08/31/2016   Chronic insomnia 08/20/2014   Anxiety 02/19/2014   Diabetes mellitus type 2, uncomplicated ([place]) [id number]   Hypercholesterolemia 01/22/2014   Hypertension 01/22/2014   Back pain 07/05/2013   Foreign body in bladder and urethra [id number]   Renal colic [id number]   Disorder of calcium metabolism 03/09/2012   Gross hematuria 03/09/2012   Hydronephrosis 03/09/2012   Kidney stone 03/09/2012   Mixed urge and stress  incontinence 03/09/2012   Ureteric stone 03/09/2012   PCP:  Latanya Maudlin, NP Pharmacy:   [id number] [place], [place] - [id number]: [phone]: [phone] [id number] [address]: [phone]: [phone]   Social Determinants of Health (SDOH) Social History: SDOH Screenings   Food Insecurity: No Food Insecurity (05/25/2022)  Housing: Low Risk  (05/25/2022)  Transportation Needs: No Transportation Needs (05/25/2022)  Utilities: Not At Risk (05/25/2022)  Tobacco Use: Medium Risk (05/25/2022)   SDOH Interventions:     Readmission Risk Interventions      No data to display

## 2022-05-29 LAB — CULTURE, BLOOD (ROUTINE X 2)
Culture: NO GROWTH
Culture: NO GROWTH
Special Requests: ADEQUATE
Special Requests: ADEQUATE

## 2022-06-01 ENCOUNTER — Encounter
Admission: RE | Admit: 2022-06-01 | Discharge: 2022-06-01 | Disposition: A | Discharge status: Home / Self Care | Payer: Medicare PPO | Source: Ambulatory Visit | Attending: Urology | Admitting: Urology

## 2022-06-01 VITALS — Ht 62.0 in | Wt 164.0 lb

## 2022-06-01 DIAGNOSIS — Z01812 Encounter for preprocedural laboratory examination: Secondary | ICD-10-CM

## 2022-06-01 DIAGNOSIS — E119 Type 2 diabetes mellitus without complications: Secondary | ICD-10-CM

## 2022-06-01 HISTORY — DX: Vitamin D deficiency, unspecified: E55.9

## 2022-06-01 HISTORY — DX: Other specified disorders of bone density and structure, unspecified site: M85.80

## 2022-06-01 HISTORY — DX: Psychophysiologic insomnia: F51.04

## 2022-06-01 HISTORY — DX: Other dysphagia: R13.19

## 2022-06-01 NOTE — Patient Instructions (Addendum)
Your procedure is scheduled on: Monday, March 4 Report to the Registration Desk on the 1st floor of the [facility]. To find out your arrival time, please call [phone] between 1PM - 3PM on: Friday, March 2 If your arrival time is 6:00 am, do not arrive before that time as the [place] entrance doors do not open until 6:00 am.  REMEMBER: Instructions that are not followed completely may result in serious medical risk, up to and including death; or upon the discretion of your surgeon and anesthesiologist your surgery may need to be rescheduled.  Do not eat or drink after midnight the night before surgery.  No gum chewing or hard candies.  One week prior to surgery: starting today, February 27 Stop meloxicam and Anti-inflammatories (NSAIDS) such as Advil, Aleve, Ibuprofen, Motrin, Naproxen, Naprosyn and Aspirin based products such as Excedrin, Goody's Powder, BC Powder. Stop ANY OVER THE COUNTER supplements until after surgery. You may however, continue to take Tylenol if needed for pain up until the day of surgery.  Continue taking all prescribed medications with the exception of the following:  Metformin - hold 2 days before surgery. Last day to take metformin is Friday, March 1. Resume AFTER surgery.  TAKE ONLY THESE MEDICATIONS THE MORNING OF SURGERY WITH A SIP OF WATER:  Bupropion Duloxetine (Cymbalta) Pantoprazole (Protonix) - (take one the night before and one on the morning of surgery - helps to prevent nausea after surgery.) Propranolol  No Alcohol for 24 hours before or after surgery.  No Smoking including e-cigarettes for 24 hours before surgery.  No chewable tobacco products for at least 6 hours before surgery.  No nicotine patches on the day of surgery.  Do not use any "recreational" drugs for at least a week (preferably 2 weeks) before your surgery.  Please be advised that the combination of cocaine and anesthesia may have negative outcomes, up to and  including death. If you test positive for cocaine, your surgery will be cancelled.  On the morning of surgery brush your teeth with toothpaste and water, you may rinse your mouth with mouthwash if you wish. Do not swallow any toothpaste or mouthwash.  Do not wear jewelry, make-up, hairpins, clips or nail polish.  Do not wear lotions, powders, or perfumes.   Do not shave body hair from the neck down 48 hours before surgery.  Contact lenses, hearing aids and dentures may not be worn into surgery.  Do not bring valuables to the hospital. [facility] is not responsible for any missing/lost belongings or valuables.   Notify your doctor if there is any change in your medical condition (cold, fever, infection).  Wear comfortable clothing (specific to your surgery type) to the hospital.  After surgery, you can help prevent lung complications by doing breathing exercises.  Take deep breaths and cough every 1-2 hours. Your doctor may order a device called an Incentive Spirometer to help you take deep breaths.  If you are being discharged the day of surgery, you will not be allowed to drive home. You will need a responsible individual to drive you home and stay with you for 24 hours after surgery.   If you are taking public transportation, you will need to have a responsible individual with you.  Please call the [place] Dept. at [phone] if you have any questions about these instructions.  Surgery Visitation Policy:  Patients undergoing a surgery or procedure may have two family members or support persons with them as  long as the person is not COVID-19 positive or experiencing its symptoms.

## 2022-06-01 NOTE — Pre-Procedure Instructions (Signed)
Pre-op interview completed. Patient stated that she has a deep cough and nasal congestion that started last Thursday. Has been taking over the counter delsym cough medicine and coricidin with no relief. Instructed patient to call her PCP to let them know to possibly get evaluated today. Surgery is scheduled for 6 days from today. Instructed patient that she will need to be free of illness in order to proceed with surgery. Patient acknowledged understanding and will contact her PCP today.

## 2022-06-06 MED ORDER — CHLORHEXIDINE GLUCONATE 0.12 % MT SOLN
15.0000 mL | Freq: Once | OROMUCOSAL | Status: AC
  Administered 2022-06-07: 15 mL via OROMUCOSAL

## 2022-06-06 MED ORDER — CEFAZOLIN SODIUM-DEXTROSE 2-4 GM/100ML-% IV SOLN
2.0000 g | INTRAVENOUS | Status: AC
  Administered 2022-06-07: 2 g via INTRAVENOUS

## 2022-06-06 MED ORDER — ORAL CARE MOUTH RINSE: 15.0000 mL | Freq: Once | OROMUCOSAL | Status: AC

## 2022-06-06 MED ORDER — SODIUM CHLORIDE 0.9 % IV SOLN: INTRAVENOUS | Status: DC

## 2022-06-07 ENCOUNTER — Ambulatory Visit: Payer: Medicare PPO | Admitting: Certified Registered"

## 2022-06-07 ENCOUNTER — Ambulatory Visit
Admission: RE | Admit: 2022-06-07 | Discharge: 2022-06-07 | Disposition: A | Discharge status: Home / Self Care | Payer: Medicare PPO | Attending: Urology | Admitting: Urology

## 2022-06-07 ENCOUNTER — Ambulatory Visit: Payer: Medicare PPO

## 2022-06-07 ENCOUNTER — Encounter: Payer: Self-pay | Admitting: Urology

## 2022-06-07 ENCOUNTER — Encounter
Admission: RE | Disposition: A | Discharge status: Home / Self Care | Payer: Self-pay | Source: Home / Self Care | Attending: Urology

## 2022-06-07 DIAGNOSIS — Z87891 Personal history of nicotine dependence: Secondary | ICD-10-CM | POA: Insufficient documentation

## 2022-06-07 DIAGNOSIS — E1122 Type 2 diabetes mellitus with diabetic chronic kidney disease: Secondary | ICD-10-CM | POA: Diagnosis not present

## 2022-06-07 DIAGNOSIS — E7800 Pure hypercholesterolemia, unspecified: Secondary | ICD-10-CM | POA: Diagnosis not present

## 2022-06-07 DIAGNOSIS — Z8619 Personal history of other infectious and parasitic diseases: Secondary | ICD-10-CM | POA: Diagnosis not present

## 2022-06-07 DIAGNOSIS — Z01812 Encounter for preprocedural laboratory examination: Secondary | ICD-10-CM

## 2022-06-07 DIAGNOSIS — N189 Chronic kidney disease, unspecified: Secondary | ICD-10-CM | POA: Insufficient documentation

## 2022-06-07 DIAGNOSIS — K219 Gastro-esophageal reflux disease without esophagitis: Secondary | ICD-10-CM | POA: Diagnosis not present

## 2022-06-07 DIAGNOSIS — N202 Calculus of kidney with calculus of ureter: Secondary | ICD-10-CM | POA: Insufficient documentation

## 2022-06-07 DIAGNOSIS — I129 Hypertensive chronic kidney disease with stage 1 through stage 4 chronic kidney disease, or unspecified chronic kidney disease: Secondary | ICD-10-CM | POA: Diagnosis not present

## 2022-06-07 DIAGNOSIS — F419 Anxiety disorder, unspecified: Secondary | ICD-10-CM | POA: Diagnosis not present

## 2022-06-07 DIAGNOSIS — E119 Type 2 diabetes mellitus without complications: Secondary | ICD-10-CM

## 2022-06-07 DIAGNOSIS — N201 Calculus of ureter: Secondary | ICD-10-CM

## 2022-06-07 DIAGNOSIS — F32A Depression, unspecified: Secondary | ICD-10-CM | POA: Diagnosis not present

## 2022-06-07 HISTORY — PX: CYSTOSCOPY/URETEROSCOPY/HOLMIUM LASER/STENT PLACEMENT: SHX6546

## 2022-06-07 LAB — GLUCOSE, CAPILLARY
Glucose-Capillary: 114 mg/dL — ABNORMAL HIGH (ref 70–99)
Glucose-Capillary: 125 mg/dL — ABNORMAL HIGH (ref 70–99)

## 2022-06-07 SURGERY — CYSTOSCOPY/URETEROSCOPY/HOLMIUM LASER/STENT PLACEMENT
Anesthesia: General | Laterality: Left

## 2022-06-07 MED ORDER — ROCURONIUM BROMIDE 100 MG/10ML IV SOLN
INTRAVENOUS | Status: DC | PRN
  Administered 2022-06-07: 10 mg via INTRAVENOUS
  Administered 2022-06-07: 40 mg via INTRAVENOUS

## 2022-06-07 MED ORDER — PROPOFOL 1000 MG/100ML IV EMUL
INTRAVENOUS | Status: AC
  Filled 2022-06-07: qty 100

## 2022-06-07 MED ORDER — CEFAZOLIN SODIUM-DEXTROSE 2-4 GM/100ML-% IV SOLN
INTRAVENOUS | Status: AC
  Filled 2022-06-07: qty 100

## 2022-06-07 MED ORDER — PROPRANOLOL HCL ER 60 MG PO CP24
60.0000 mg | ORAL_CAPSULE | Freq: Every day | ORAL | Status: DC
  Administered 2022-06-07: 60 mg via ORAL
  Filled 2022-06-07: qty 1

## 2022-06-07 MED ORDER — SUCCINYLCHOLINE CHLORIDE 200 MG/10ML IV SOSY
PREFILLED_SYRINGE | INTRAVENOUS | Status: DC | PRN
  Administered 2022-06-07: 100 mg via INTRAVENOUS

## 2022-06-07 MED ORDER — OXYBUTYNIN CHLORIDE 5 MG PO TABS: 5.0000 mg | ORAL_TABLET | Freq: Three times a day (TID) | ORAL | 0 refills | Status: DC | PRN

## 2022-06-07 MED ORDER — HYDROCODONE-ACETAMINOPHEN 5-325 MG PO TABS: 1.0000 | ORAL_TABLET | Freq: Four times a day (QID) | ORAL | 0 refills | Status: DC | PRN

## 2022-06-07 MED ORDER — DEXAMETHASONE SODIUM PHOSPHATE 10 MG/ML IJ SOLN
INTRAMUSCULAR | Status: DC | PRN
  Administered 2022-06-07: 4 mg via INTRAVENOUS

## 2022-06-07 MED ORDER — SUGAMMADEX SODIUM 200 MG/2ML IV SOLN
INTRAVENOUS | Status: DC | PRN
  Administered 2022-06-07: 140 mg via INTRAVENOUS

## 2022-06-07 MED ORDER — FAMOTIDINE 20 MG PO TABS
20.0000 mg | ORAL_TABLET | Freq: Once | ORAL | Status: AC
  Administered 2022-06-07: 20 mg via ORAL

## 2022-06-07 MED ORDER — IOHEXOL 180 MG/ML  SOLN
INTRAMUSCULAR | Status: DC | PRN
  Administered 2022-06-07: 20 mL

## 2022-06-07 MED ORDER — PHENYLEPHRINE 80 MCG/ML (10ML) SYRINGE FOR IV PUSH (FOR BLOOD PRESSURE SUPPORT)
PREFILLED_SYRINGE | INTRAVENOUS | Status: DC | PRN
  Administered 2022-06-07: 160 ug via INTRAVENOUS
  Administered 2022-06-07: 80 ug via INTRAVENOUS

## 2022-06-07 MED ORDER — TAMSULOSIN HCL 0.4 MG PO CAPS: 0.4000 mg | ORAL_CAPSULE | Freq: Every day | ORAL | 0 refills | Status: DC

## 2022-06-07 MED ORDER — ACETAMINOPHEN 10 MG/ML IV SOLN
INTRAVENOUS | Status: AC
  Filled 2022-06-07: qty 100

## 2022-06-07 MED ORDER — FAMOTIDINE 20 MG PO TABS
ORAL_TABLET | ORAL | Status: AC
  Filled 2022-06-07: qty 1

## 2022-06-07 MED ORDER — EPHEDRINE SULFATE (PRESSORS) 50 MG/ML IJ SOLN
INTRAMUSCULAR | Status: DC | PRN
  Administered 2022-06-07: 5 mg via INTRAVENOUS

## 2022-06-07 MED ORDER — FENTANYL CITRATE (PF) 100 MCG/2ML IJ SOLN
INTRAMUSCULAR | Status: AC
  Filled 2022-06-07: qty 2

## 2022-06-07 MED ORDER — GLYCOPYRROLATE 0.2 MG/ML IJ SOLN
INTRAMUSCULAR | Status: DC | PRN
  Administered 2022-06-07: 2 mg via INTRAVENOUS

## 2022-06-07 MED ORDER — ONDANSETRON HCL 4 MG/2ML IJ SOLN: 4.0000 mg | Freq: Once | INTRAMUSCULAR | Status: DC | PRN

## 2022-06-07 MED ORDER — FLUCONAZOLE 150 MG PO TABS: 150.0000 mg | ORAL_TABLET | Freq: Every day | ORAL | 1 refills | Status: DC

## 2022-06-07 MED ORDER — LIDOCAINE HCL (CARDIAC) PF 100 MG/5ML IV SOSY
PREFILLED_SYRINGE | INTRAVENOUS | Status: DC | PRN
  Administered 2022-06-07: 100 mg via INTRAVENOUS

## 2022-06-07 MED ORDER — ACETAMINOPHEN 10 MG/ML IV SOLN
INTRAVENOUS | Status: DC | PRN
  Administered 2022-06-07: 1000 mg via INTRAVENOUS

## 2022-06-07 MED ORDER — FENTANYL CITRATE (PF) 100 MCG/2ML IJ SOLN: 25.0000 ug | INTRAMUSCULAR | Status: DC | PRN

## 2022-06-07 MED ORDER — PROPOFOL 10 MG/ML IV BOLUS
INTRAVENOUS | Status: DC | PRN
  Administered 2022-06-07: 150 mg via INTRAVENOUS

## 2022-06-07 MED ORDER — CHLORHEXIDINE GLUCONATE 0.12 % MT SOLN
OROMUCOSAL | Status: AC
  Filled 2022-06-07: qty 15

## 2022-06-07 MED ORDER — ONDANSETRON HCL 4 MG/2ML IJ SOLN
INTRAMUSCULAR | Status: DC | PRN
  Administered 2022-06-07 (×2): 4 mg via INTRAVENOUS

## 2022-06-07 MED ORDER — FENTANYL CITRATE (PF) 100 MCG/2ML IJ SOLN
INTRAMUSCULAR | Status: DC | PRN
  Administered 2022-06-07: 50 ug via INTRAVENOUS

## 2022-06-07 SURGICAL SUPPLY — 25 items
ADH LQ OCL WTPRF AMP STRL LF (MISCELLANEOUS) ×1
ADHESIVE MASTISOL STRL (MISCELLANEOUS) IMPLANT
BAG DRAIN SIEMENS DORNER NS (MISCELLANEOUS) ×1 IMPLANT
BAG DRN NS LF (MISCELLANEOUS) ×1
BRUSH SCRUB EZ 1% IODOPHOR (MISCELLANEOUS) ×1 IMPLANT
CATH URET FLEX-TIP 2 LUMEN 10F (CATHETERS) IMPLANT
CATH URETL OPEN 5X70 (CATHETERS) ×1 IMPLANT
DRAPE UTILITY 15X26 TOWEL STRL (DRAPES) ×1 IMPLANT
DRSG TEGADERM 2-3/8X2-3/4 SM (GAUZE/BANDAGES/DRESSINGS) IMPLANT
FIBER LASER MOSES 200 DFL (Laser) IMPLANT
GLOVE BIO SURGEON STRL SZ 6.5 (GLOVE) ×1 IMPLANT
GOWN STRL REUS W/ TWL LRG LVL3 (GOWN DISPOSABLE) ×2 IMPLANT
GOWN STRL REUS W/TWL LRG LVL3 (GOWN DISPOSABLE) ×2
GUIDEWIRE GREEN .038 145CM (MISCELLANEOUS) IMPLANT
GUIDEWIRE STR DUAL SENSOR (WIRE) ×1 IMPLANT
IV NS IRRIG 3000ML ARTHROMATIC (IV SOLUTION) ×1 IMPLANT
KIT TURNOVER CYSTO (KITS) ×1 IMPLANT
PACK CYSTO AR (MISCELLANEOUS) ×1 IMPLANT
SET CYSTO W/LG BORE CLAMP LF (SET/KITS/TRAYS/PACK) ×1 IMPLANT
SHEATH NAVIGATOR HD 12/14X36 (SHEATH) IMPLANT
STENT URET 6FRX24 CONTOUR (STENTS) IMPLANT
SURGILUBE 2OZ TUBE FLIPTOP (MISCELLANEOUS) ×1 IMPLANT
SYR 20ML LL LF (SYRINGE) IMPLANT
TRAP FLUID SMOKE EVACUATOR (MISCELLANEOUS) ×1 IMPLANT
WATER STERILE IRR 500ML POUR (IV SOLUTION) ×1 IMPLANT

## 2022-06-07 NOTE — Anesthesia Procedure Notes (Signed)

## 2022-06-07 NOTE — Anesthesia Preprocedure Evaluation (Signed)
Anesthesia Evaluation  Patient identified by MRN, date of birth, ID band Patient awake    Reviewed: Allergy & Precautions, NPO status , Patient's Chart, lab work & pertinent test results  History of Anesthesia Complications (+) PONV and history of anesthetic complications  Airway Mallampati: III  TM Distance: <3 FB Neck ROM: full    Dental  (+) Chipped, Poor Dentition, Missing, Dental Advidsory Given   Pulmonary neg shortness of breath, neg sleep apnea, neg COPD, neg recent URI, former smoker   Pulmonary exam normal        Cardiovascular Exercise Tolerance: Good hypertension, (-) angina (-) Past MI and (-) DOE Normal cardiovascular exam(-) dysrhythmias      Neuro/Psych  Headaches, neg Seizures PSYCHIATRIC DISORDERS Anxiety        GI/Hepatic Neg liver ROS, hiatal hernia,GERD  Controlled,,  Endo/Other  diabetes, Type 2    Renal/GU Renal disease     Musculoskeletal   Abdominal   Peds  Hematology negative hematology ROS (+)   Anesthesia Other Findings Past Medical History: No date: Anesthesia complication     Comment:  bradycardia No date: Anxiety No date: Arthritis     Comment:  lower back, left hip  No date: Chicken pox No date: Chronic kidney disease No date: Depression No date: Diabetes mellitus without complication (HCC) No date: GERD (gastroesophageal reflux disease) 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 No date: History of hiatal hernia No date: History of kidney stones No date: Hypercholesterolemia No date: Hypertension No date: Migraines     Comment:  rare now No date: Nephrolithiasis No date: PONV (postoperative nausea and vomiting)     Comment:  VOMITED A LITTLE BIT AFTER KIDNEY STONE SURGERY  Past Surgical History: No date: BREAST CYST ASPIRATION; Left     Comment:  negative 2012 No date: CHOLECYSTECTOMY No date: COLONOSCOPY 12/25/2020: COLONOSCOPY WITH PROPOFOL; N/A     Comment:  Procedure:  COLONOSCOPY WITH PROPOFOL;  Surgeon: Lucilla Lame, MD;  Location: [facility] ENDOSCOPY;  Service:               Endoscopy;  Laterality: N/A; No date: COLONOSCOPY, ESOPHAGOGASTRODUODENOSCOPY (EGD) AND ESOPHAGEAL  DILATION 12/04/2018: CYSTOSCOPY W/ RETROGRADES; Bilateral     Comment:  Procedure: CYSTOSCOPY WITH RETROGRADE PYELOGRAM;                Surgeon: Hollice Espy, MD;  Location: [facility] ORS;                Service: Urology;  Laterality: Bilateral; 06/26/2018: CYSTOSCOPY/URETEROSCOPY/HOLMIUM LASER/STENT PLACEMENT; Left     Comment:  Procedure: CYSTOSCOPY/URETEROSCOPY/HOLMIUM LASER/STENT               Exchange;  Surgeon: Hollice Espy, MD;  Location: [facility]               ORS;  Service: Urology;  Laterality: Left; 12/04/2018: CYSTOSCOPY/URETEROSCOPY/HOLMIUM LASER/STENT PLACEMENT;  Right     Comment:  Procedure: CYSTOSCOPY/URETEROSCOPY/HOLMIUM LASER/STENT               PLACEMENT;  Surgeon: Hollice Espy, MD;  Location: [facility]              ORS;  Service: Urology;  Laterality: Right; 01/19/2017: DISTAL BICEPS TENDON REPAIR; Right     Comment:  Procedure: BICEPS TENODESIS;  Surgeon: Corky Mull,               MD;  Location: [place];  Service:  Orthopedics;  Laterality: Right;  Diabetic - oral meds 01/31/2015: ESOPHAGOGASTRODUODENOSCOPY (EGD) WITH PROPOFOL; N/A     Comment:  Procedure: ESOPHAGOGASTRODUODENOSCOPY (EGD) WITH               PROPOFOL;  Surgeon: Manya Silvas, MD;  Location[facility]              ENDOSCOPY;  Service: Endoscopy;  Laterality: N/A; 06/19/2020: ESOPHAGOGASTRODUODENOSCOPY (EGD) WITH PROPOFOL; N/A     Comment:  Procedure: ESOPHAGOGASTRODUODENOSCOPY (EGD) WITH               PROPOFOL;  Surgeon: Lucilla Lame, MD;  Location: [facility]               ENDOSCOPY;  Service: Endoscopy;  Laterality: N/A; 12/25/2020: ESOPHAGOGASTRODUODENOSCOPY (EGD) WITH PROPOFOL; N/A     Comment:  Procedure: ESOPHAGOGASTRODUODENOSCOPY (EGD) WITH               PROPOFOL;   Surgeon: Lucilla Lame, MD;  Location: [facility]               ENDOSCOPY;  Service: Endoscopy;  Laterality: N/A; 12/22/2021: ESOPHAGOGASTRODUODENOSCOPY (EGD) WITH PROPOFOL; N/A     Comment:  Procedure: ESOPHAGOGASTRODUODENOSCOPY (EGD) WITH               PROPOFOL;  Surgeon: Lucilla Lame, MD;  Location: [facility]               ENDOSCOPY;  Service: Endoscopy;  Laterality: N/A; 01/28/2022: ESOPHAGOGASTRODUODENOSCOPY (EGD) WITH PROPOFOL; N/A     Comment:  Procedure: ESOPHAGOGASTRODUODENOSCOPY (EGD) WITH               PROPOFOL;  Surgeon: Lucilla Lame, MD;  Location: [facility]               ENDOSCOPY;  Service: Endoscopy;  Laterality: N/A; No date: esophogastroduodenoscopy No date: esophogastroduodeoscopy No date: HERNIA REPAIR 05/22/2018: IR NEPHROSTOMY PLACEMENT LEFT 05/22/2018: NEPHROLITHOTOMY; Left     Comment:  Procedure: NEPHROLITHOTOMY PERCUTANEOUS;  Surgeon:               Hollice Espy, MD;  Location: [facility] ORS;  Service:               Urology;  Laterality: Left; 01/31/2015: SAVORY DILATION; N/A     Comment:  Procedure: SAVORY DILATION;  Surgeon: Manya Silvas,               MD;  Location: [facility] ENDOSCOPY;  Service: Endoscopy;                Laterality: N/A; 01/19/2017: SHOULDER ARTHROSCOPY; Right     Comment:  Procedure: ARTHROSCOPY SHOULDER DEBRIDEMENT               DECOMPRESSION AND  ROTATOR CUFF REPAIR AND BICEPS               TENODESIS;  Surgeon: Corky Mull, MD;  Location: [place];  Service: Orthopedics;  Laterality: Right; 03/18/2022: TOTAL HIP ARTHROPLASTY; Right     Comment:  Procedure: Right posterior total hip arthroplasty;                Surgeon: Steffanie Rainwater, MD;  Location: [facility] ORS;                Service: Orthopedics;  Laterality: Right;     Reproductive/Obstetrics negative OB ROS  Anesthesia Physical Anesthesia Plan  ASA: 3  Anesthesia Plan: General   Post-op Pain Management:    Induction:  Intravenous  PONV Risk Score and Plan: Ondansetron, Dexamethasone, Midazolam and Treatment may vary due to age or medical condition  Airway Management Planned: Oral ETT  Additional Equipment:   Intra-op Plan:   Post-operative Plan: Extubation in OR  Informed Consent: I have reviewed the patients History and Physical, chart, labs and discussed the procedure including the risks, benefits and alternatives for the proposed anesthesia with the patient or authorized representative who has indicated his/her understanding and acceptance.     Dental Advisory Given  Plan Discussed with: Anesthesiologist, CRNA and Surgeon  Anesthesia Plan Comments: (Patient consented for risks of anesthesia including but not limited to:  - adverse reactions to medications - damage to eyes, teeth, lips or other oral mucosa - nerve damage due to positioning  - sore throat or hoarseness - Damage to heart, brain, nerves, lungs, other parts of body or loss of life  Patient voiced understanding.)       Anesthesia Quick Evaluation

## 2022-06-07 NOTE — Op Note (Signed)
Date of procedure: 06/07/22  Preoperative diagnosis:  Left ureteral calculus Left nephrolithiasis History of sepsis of urinary source  Postoperative diagnosis:  Same as above  Procedure: Left ureteroscopy Laser lithotripsy Left ureteral stent exchange Retrograde pyelogram Interpretation of fluoroscopy less than 30 minutes  Surgeon: Hollice Espy, MD  Anesthesia: General  Complications: None  Intraoperative findings: Left distal ureteral fragment no longer present.  Left upper tract stones completely removed, in addition there was stone matrix and debris within the left collecting system.  EBL: Minimal  Specimens: None  Drains: 6 x 24 French double-J ureteral stent on left with tether  Indication: SHATISHA MUILENBURG is a 72 y.o. patient with personal history of sepsis secondary to obstructing left ureteral calculus status post emergent ureteral stent.  Return today for definitive management of her stone along with upper tract stone burden.  After reviewing the management options for treatment, she/her/hers  elected to proceed with the above surgical procedure(s). We have discussed the potential benefits and risks of the procedure, side effects of the proposed treatment, the likelihood of the patient achieving the goals of the procedure, and any potential problems that might occur during the procedure or recuperation. Informed consent has been obtained.  Description of procedure:  The patient was taken to the operating room and general anesthesia was induced.  The patient was placed in the dorsal lithotomy position, prepped and draped in the usual sterile fashion, and preoperative antibiotics were administered. A preoperative time-out was performed.    A 21 French scope was advanced per urethra into the bladder.  Attention was turned to the left ureteral orifice from which a ureteral stent was seen emanating.  The distal coil of the stent was grasped and brought to level urethral  meatus.  This was then cannulated using a sensor wire up to the level of the kidney.  At semirigid ureteroscope was then advanced up to the level of the mid ureter and no stone was encountered within the distal ureter.  A dual-lumen ureteral access sheath was used just within the distal ureter and additional contrast was used to create a retrograde pyelogram on the side.    A Super Stiff wire was then introduced all the way up to the level of the kidney through the second lumen of the catheter.  The sensor wire was snapped in place as a safety wire.  A ureteral access sheath, 10/12 [place] was advanced to the level of the proximal ureter..  A dual-lumen digital ureteroscope was then advanced up to the level of the stone.  A 200 m laser fiber was then brought in using dusting settings of 0.3 J and 80 Hz, the stone was dusted.  Notably, there was copious amounts of biofilm/debris/stone matrix and/or pus overlying the stone and throughout the collecting system which was aspirated and cleared. The scope was then advanced into the each of the calyces and additional stone burden was identified and dusted.  Access in the extreme lower pole was somewhat difficult but ultimately successful and ended up removing the stone from this location into the renal pelvis.  At the end of the procedure, no significant residual stone fragment remained greater than the size of the tip of the laser fiber.  A final retrograde pyelogram created roadmap to ensure that each every calyx was visualized and all significant stone burden was addressed.  There was no contrast extravasation.  The scope was then backed down the length of the ureter inspecting the ureteral integrity along the  way.  There was no residual stone burden or significant ureteral injuries appreciated.  Finally, a 6 by 24 French ureteral access sheath was advanced over the safety wire up to the level of the kidney.  Upon wire removal, there was a full coil noted both within  the renal pelvis as well as within the bladder.  The stent string was left at fixed to the distal coil of the stent and secured to the patient's using Mastisol and Tegaderm.  The patient was then cleaned and dried, repositioned in supine position, reversed of anesthesia, taken to the PACU in stable condition.  Plan: She should remove her stent in 7 days on tether.  Plan for renal ultrasound in 4 -6  weeks.    Hollice Espy, M.D.

## 2022-06-07 NOTE — Transfer of Care (Signed)
Immediate Anesthesia Transfer of Care Note  Patient: Sheryl [name]  Procedure(s) Performed: CYSTOSCOPY/URETEROSCOPY/HOLMIUM LASER/STENT EXCHANGE (Left)  Patient Location: PACU  Anesthesia Type:General  Level of Consciousness: awake, drowsy, and patient cooperative  Airway & Oxygen Therapy: Patient Spontanous Breathing and Patient connected to face mask oxygen  Post-op Assessment: Report given to RN and Post -op Vital signs reviewed and stable  Post vital signs: Reviewed and stable  Last Vitals:  Vitals Value Taken Time  BP 140/64 06/07/22 1245  Temp    Pulse 80 06/07/22 1246  Resp 21 06/07/22 1246  SpO2 96 % 06/07/22 1246  Vitals shown include unvalidated device data.  Last Pain:  Vitals:   06/07/22 0958  TempSrc: Temporal  PainSc: 0-No pain         Complications: No notable events documented.

## 2022-06-07 NOTE — Discharge Instructions (Addendum)
You have a ureteral stent in place.  This is a tube that extends from your kidney to your bladder.  This may cause urinary bleeding, burning with urination, and urinary frequency.  Please call our office or present to the ED if you develop fevers >101 or pain which is not able to be controlled with oral pain medications.  You may be given either Flomax and/ or ditropan to help with bladder spasms and stent pain in addition to pain medications.    You may remove your stent in 1 week.    [facility] [address], [place], [place] [address] [phone]   AMBULATORY SURGERY  DISCHARGE INSTRUCTIONS   The drugs that you were given will stay in your system until tomorrow so for the next 24 hours you should not:  Drive an automobile Make any legal decisions Drink any alcoholic beverage   You may resume regular meals tomorrow.  Today it is better to start with liquids and gradually work up to solid foods.  You may eat anything you prefer, but it is better to start with liquids, then soup and crackers, and gradually work up to solid foods.   Please notify your doctor immediately if you have any unusual bleeding, trouble breathing, redness and pain at the surgery site, drainage, fever, or pain not relieved by medication.    Additional Instructions:   Please contact your physician with any problems or Same Day Surgery at [phone], Monday through Friday 6 am to 4 pm, or [place] at [facility] number at [phone].

## 2022-06-07 NOTE — Interval H&P Note (Signed)
History and Physical Interval Note:  06/07/2022 11:14 AM  Sheryl [name]  has presented today for surgery, with the diagnosis of Left Ureteral Stone.  The various methods of treatment have been discussed with the patient and family. After consideration of risks, benefits and other options for treatment, the patient has consented to  Procedure(s): CYSTOSCOPY/URETEROSCOPY/HOLMIUM LASER/STENT EXCHANGE (Left) as a surgical intervention.  The patient's history has been reviewed, patient examined, no change in status, stable for surgery.  I have reviewed the patient's chart and labs.  Questions were answered to the patient's satisfaction.    RRR CTAB   Hollice Espy

## 2022-06-08 ENCOUNTER — Encounter: Payer: Self-pay | Admitting: Urology

## 2022-06-08 ENCOUNTER — Other Ambulatory Visit: Payer: Self-pay

## 2022-06-08 DIAGNOSIS — N201 Calculus of ureter: Secondary | ICD-10-CM

## 2022-06-08 NOTE — Anesthesia Postprocedure Evaluation (Signed)
Anesthesia Post Note  Patient: Sheryl [name]  Procedure(s) Performed: CYSTOSCOPY/URETEROSCOPY/HOLMIUM LASER/STENT EXCHANGE (Left)  Patient location during evaluation: PACU Anesthesia Type: General Level of consciousness: awake and alert Pain management: pain level controlled Vital Signs Assessment: post-procedure vital signs reviewed and stable Respiratory status: spontaneous breathing, nonlabored ventilation, respiratory function stable and patient connected to nasal cannula oxygen Cardiovascular status: blood pressure returned to baseline and stable Postop Assessment: no apparent nausea or vomiting Anesthetic complications: no   No notable events documented.   Last Vitals:  Vitals:   06/07/22 1345 06/07/22 1403  BP: (!) 141/58 136/69  Pulse: 74 76  Resp: 13 15  Temp: 36.6 C 36.6 C  SpO2: 97% 95%    Last Pain:  Vitals:   06/07/22 1403  TempSrc: Temporal  PainSc: 0-No pain                 Martha Clan

## 2022-06-29 ENCOUNTER — Other Ambulatory Visit: Payer: Self-pay | Admitting: Urology

## 2022-07-14 ENCOUNTER — Ambulatory Visit
Admission: RE | Admit: 2022-07-14 | Discharge: 2022-07-14 | Disposition: A | Discharge status: Home / Self Care | Payer: Medicare PPO | Source: Ambulatory Visit | Attending: Urology | Admitting: Urology

## 2022-07-14 DIAGNOSIS — N201 Calculus of ureter: Secondary | ICD-10-CM | POA: Insufficient documentation

## 2022-08-11 ENCOUNTER — Other Ambulatory Visit: Payer: Self-pay | Admitting: Gerontology

## 2022-08-11 DIAGNOSIS — Z1231 Encounter for screening mammogram for malignant neoplasm of breast: Secondary | ICD-10-CM

## 2022-09-07 ENCOUNTER — Ambulatory Visit
Admission: RE | Admit: 2022-09-07 | Discharge: 2022-09-07 | Disposition: A | Discharge status: Home / Self Care | Payer: Medicare PPO | Source: Ambulatory Visit | Attending: Gerontology | Admitting: Gerontology

## 2022-09-07 DIAGNOSIS — Z1231 Encounter for screening mammogram for malignant neoplasm of breast: Secondary | ICD-10-CM | POA: Diagnosis present

## 2022-09-22 ENCOUNTER — Telehealth: Payer: Self-pay

## 2022-09-22 NOTE — Telephone Encounter (Signed)
Pt left message on the triage line, stating she thinks she has a kidney stone. Pt states she is having right flank pain, urinary retention and feeling like she is not emptying well. Pt was informed that Dr.Brandon looked at her most recent Renal scan and didn't see any kidney stones. Pt was advised to drink lots of water and to call tomorrow morning, if symptoms continue. Pt voiced understanding.

## 2022-09-23 ENCOUNTER — Ambulatory Visit
Admission: RE | Admit: 2022-09-23 | Discharge: 2022-09-23 | Disposition: A | Discharge status: Home / Self Care | Payer: Medicare PPO | Source: Ambulatory Visit | Attending: Physician Assistant | Admitting: Physician Assistant

## 2022-09-23 ENCOUNTER — Ambulatory Visit: Payer: Medicare PPO | Admitting: Physician Assistant

## 2022-09-23 ENCOUNTER — Other Ambulatory Visit: Payer: Self-pay | Admitting: Physician Assistant

## 2022-09-23 VITALS — BP 139/99 | Temp 99.9°F | Ht 62.0 in | Wt 164.0 lb

## 2022-09-23 DIAGNOSIS — R109 Unspecified abdominal pain: Secondary | ICD-10-CM

## 2022-09-23 DIAGNOSIS — Z87442 Personal history of urinary calculi: Secondary | ICD-10-CM | POA: Insufficient documentation

## 2022-09-23 DIAGNOSIS — N12 Tubulo-interstitial nephritis, not specified as acute or chronic: Secondary | ICD-10-CM

## 2022-09-23 LAB — MICROSCOPIC EXAMINATION: Epithelial Cells (non renal): >10 /hpf — AB (ref 0–10)

## 2022-09-23 LAB — URINALYSIS, COMPLETE
Bilirubin, UA: NEGATIVE
Glucose, UA: NEGATIVE
Ketones, UA: NEGATIVE
Nitrite, UA: NEGATIVE
Protein,UA: NEGATIVE
Specific Gravity, UA: 1.01 (ref 1.005–1.030)
Urobilinogen, Ur: 0.2 mg/dL (ref 0.2–1.0)
pH, UA: 5.5 (ref 5.0–7.5)

## 2022-09-23 MED ORDER — SULFAMETHOXAZOLE-TRIMETHOPRIM 800-160 MG PO TABS: 1.0000 | ORAL_TABLET | Freq: Two times a day (BID) | ORAL | 0 refills | Status: AC

## 2022-09-23 MED ORDER — CEFTRIAXONE SODIUM 1 G IJ SOLR
1.0000 g | Freq: Once | INTRAMUSCULAR | Status: AC
  Administered 2022-09-23: 1 g via INTRAMUSCULAR

## 2022-09-23 NOTE — Progress Notes (Signed)
09/23/2022 4:34 PM   Sheryl [name] 01-24-1951 [id number]  CC: Chief Complaint  Patient presents with   Follow-up   Urinary Tract Infection   HPI: Sheryl [name] is a 72 y.o. female with PMH nephrolithiasis with a history of sepsis due to obstructing left ureteral stone in February 2024 who presents today for evaluation of possible UTI versus stone episode.   She is s/p left ureteroscopy with laser lithotripsy and stent exchange with Dr. Apolinar Junes on 06/07/2022.  Left upper tract stones were completely removed at that time and follow-up renal ultrasound on 07/14/2022 showed no evidence of shadowing stones or hydronephrosis.  Today she reports a 2 to 4-day history of dysuria, right flank pain, and headache.  She had nausea and vomiting yesterday that has since resolved.  She denies fever, chills, and gross hematuria.  She has not taken any medication for her symptoms.  She denies left flank pain.  On CT stone study from 05/24/2022, she did have nonobstructing right renal stones.  In-office UA today positive for 2+ blood and trace leukocytes; urine microscopy with 11-30 WBCs/HPF, 3-10 RBCs/HPF, >10 epithelial cells/hpf, and few bacteria.   PMH: Past Medical History:  Diagnosis Date   Anesthesia complication    bradycardia   Anxiety    Arthritis    lower back, left hip    Chicken pox    Chronic insomnia    Chronic kidney disease    Depression    Diabetes mellitus without complication (HCC)    Esophageal dysphagia    GERD (gastroesophageal reflux disease)    Hemochromatosis carrier    History of hiatal hernia    History of kidney stones    Hypercholesterolemia    Hypertension    Migraines    rare now   Nephrolithiasis    Osteopenia    PONV (postoperative nausea and vomiting)    VOMITED A LITTLE BIT AFTER KIDNEY STONE SURGERY   Sepsis (HCC) 05/24/2022   Vitamin D deficiency     Surgical History: Past Surgical History:  Procedure Laterality Date   BREAST CYST ASPIRATION  Left    negative 2012   CHOLECYSTECTOMY     COLONOSCOPY     2007, 2012   COLONOSCOPY WITH PROPOFOL N/A 12/25/2020   Procedure: COLONOSCOPY WITH PROPOFOL;  Surgeon: Midge Minium, MD;  Location: [facility];  Service: Endoscopy;  Laterality: N/A;   COLONOSCOPY, ESOPHAGOGASTRODUODENOSCOPY (EGD) AND ESOPHAGEAL DILATION     CYSTOSCOPY W/ RETROGRADES Bilateral 12/04/2018   Procedure: CYSTOSCOPY WITH RETROGRADE PYELOGRAM;  Surgeon: Vanna Scotland, MD;  Location: [facility];  Service: Urology;  Laterality: Bilateral;   CYSTOSCOPY WITH STENT PLACEMENT Left 05/24/2022   Procedure: CYSTOSCOPY WITH STENT PLACEMENT;  Surgeon: Vanna Scotland, MD;  Location: [facility];  Service: Urology;  Laterality: Left;   CYSTOSCOPY/URETEROSCOPY/HOLMIUM LASER/STENT PLACEMENT Left 06/26/2018   Procedure: CYSTOSCOPY/URETEROSCOPY/HOLMIUM LASER/STENT Exchange;  Surgeon: Vanna Scotland, MD;  Location: [facility];  Service: Urology;  Laterality: Left;   CYSTOSCOPY/URETEROSCOPY/HOLMIUM LASER/STENT PLACEMENT Right 12/04/2018   Procedure: CYSTOSCOPY/URETEROSCOPY/HOLMIUM LASER/STENT PLACEMENT;  Surgeon: Vanna Scotland, MD;  Location: [facility];  Service: Urology;  Laterality: Right;   CYSTOSCOPY/URETEROSCOPY/HOLMIUM LASER/STENT PLACEMENT Left 06/07/2022   Procedure: CYSTOSCOPY/URETEROSCOPY/HOLMIUM LASER/STENT EXCHANGE;  Surgeon: Vanna Scotland, MD;  Location: [facility];  Service: Urology;  Laterality: Left;   DISTAL BICEPS TENDON REPAIR Right 01/19/2017   Procedure: BICEPS TENODESIS;  Surgeon: Christena Flake, MD;  Location: [facility];  Service: Orthopedics;  Laterality: Right;  Diabetic - oral meds   ESOPHAGOGASTRODUODENOSCOPY  2012, 2014, 2016, 2018   ESOPHAGOGASTRODUODENOSCOPY (EGD) WITH PROPOFOL N/A 01/31/2015   Procedure: ESOPHAGOGASTRODUODENOSCOPY (EGD) WITH PROPOFOL;  Surgeon: Scot Jun, MD;  Location: [facility] ENDOSCOPY;  Service: Endoscopy;  Laterality: N/A;   ESOPHAGOGASTRODUODENOSCOPY (EGD) WITH PROPOFOL  N/A 06/19/2020   Procedure: ESOPHAGOGASTRODUODENOSCOPY (EGD) WITH PROPOFOL;  Surgeon: Midge Minium, MD;  Location: [facility];  Service: Endoscopy;  Laterality: N/A;   ESOPHAGOGASTRODUODENOSCOPY (EGD) WITH PROPOFOL N/A 12/25/2020   Procedure: ESOPHAGOGASTRODUODENOSCOPY (EGD) WITH PROPOFOL;  Surgeon: Midge Minium, MD;  Location: [facility] ENDOSCOPY;  Service: Endoscopy;  Laterality: N/A;   ESOPHAGOGASTRODUODENOSCOPY (EGD) WITH PROPOFOL N/A 12/22/2021   Procedure: ESOPHAGOGASTRODUODENOSCOPY (EGD) WITH PROPOFOL;  Surgeon: Midge Minium, MD;  Location: [facility];  Service: Endoscopy;  Laterality: N/A;   ESOPHAGOGASTRODUODENOSCOPY (EGD) WITH PROPOFOL N/A 01/28/2022   Procedure: ESOPHAGOGASTRODUODENOSCOPY (EGD) WITH PROPOFOL;  Surgeon: Midge Minium, MD;  Location: [facility];  Service: Endoscopy;  Laterality: N/A;   HERNIA REPAIR     IR NEPHROSTOMY PLACEMENT LEFT  05/22/2018   LITHOTRIPSY     NEPHROLITHOTOMY Left 05/22/2018   Procedure: NEPHROLITHOTOMY PERCUTANEOUS;  Surgeon: Vanna Scotland, MD;  Location: [facility];  Service: Urology;  Laterality: Left;   SAVORY DILATION N/A 01/31/2015   Procedure: SAVORY DILATION;  Surgeon: Scot Jun, MD;  Location: [facility] ENDOSCOPY;  Service: Endoscopy;  Laterality: N/A;   SHOULDER ARTHROSCOPY Right 01/19/2017   Procedure: ARTHROSCOPY SHOULDER DEBRIDEMENT DECOMPRESSION AND  ROTATOR CUFF REPAIR AND BICEPS TENODESIS;  Surgeon: Christena Flake, MD;  Location: [facility];  Service: Orthopedics;  Laterality: Right;   TOTAL HIP ARTHROPLASTY Right 03/18/2022   Procedure: Right posterior total hip arthroplasty;  Surgeon: Reinaldo Berber, MD;  Location: [facility];  Service: Orthopedics;  Laterality: Right;    Home Medications:  Allergies as of 09/23/2022   No Known Allergies      Medication List        Accurate as of September 23, 2022  4:34 PM. If you have any questions, ask your nurse or doctor.          acetaminophen 500 MG tablet Commonly  known as: TYLENOL Take 2 tablets (1,000 mg total) by mouth every 8 (eight) hours.   buPROPion 300 MG 24 hr tablet Commonly known as: WELLBUTRIN XL Take 300 mg by mouth daily.   CORICIDIN D PO Take 1 tablet by mouth as needed.   cyanocobalamin 1000 MCG tablet Commonly known as: VITAMIN B12 Take 1,000 mcg by mouth daily.   DELSYM DAY NIGHT PO Take 1 Dose by mouth as needed.   DULoxetine 60 MG capsule Commonly known as: CYMBALTA Take 60 mg by mouth daily.   fluconazole 150 MG tablet Commonly known as: Diflucan Take 1 tablet (150 mg total) by mouth daily.   HYDROcodone-acetaminophen 5-325 MG tablet Commonly known as: NORCO/VICODIN Take 1-2 tablets by mouth every 6 (six) hours as needed for moderate pain.   lisinopril-hydrochlorothiazide 20-25 MG tablet Commonly known as: ZESTORETIC Take 0.5 tablets by mouth daily.   meloxicam 15 MG tablet Commonly known as: MOBIC Take 15 mg by mouth daily as needed for pain.   metFORMIN 850 MG tablet Commonly known as: GLUCOPHAGE Take 850 mg by mouth 2 (two) times daily with a meal.   oxybutynin 5 MG tablet Commonly known as: DITROPAN Take 1 tablet (5 mg total) by mouth every 8 (eight) hours as needed for bladder spasms.   pantoprazole 40 MG tablet Commonly known as: PROTONIX Take 40 mg by mouth at bedtime.   potassium chloride 8  MEQ tablet Commonly known as: KLOR-CON 8 mEq daily.   propranolol ER 60 MG 24 hr capsule Commonly known as: INDERAL LA Take 60 mg by mouth daily.   rosuvastatin 5 MG tablet Commonly known as: CRESTOR Take 5 mg by mouth at bedtime.   tamsulosin 0.4 MG Caps capsule Commonly known as: Flomax Take 1 capsule (0.4 mg total) by mouth daily.   traMADol 50 MG tablet Commonly known as: ULTRAM Take 1 tablet (50 mg total) by mouth every 6 (six) hours as needed for moderate pain.   traZODone 50 MG tablet Commonly known as: DESYREL Take 50 mg by mouth at bedtime.   Vitamin D3 50 MCG (2000 UT)  capsule Take 2,000 Units by mouth daily.        Allergies:  No Known Allergies  Family History: No family history on file.  Social History:   reports that she quit smoking about 14 years ago. Her smoking use included cigarettes. She has a 37.50 pack-year smoking history. She has never used smokeless tobacco. She reports that she does not drink alcohol and does not use drugs.  Physical Exam: BP (!) 139/99   Temp 99.9 F (37.7 C)   Ht 5\' 2"  (1.575 m)   Wt 164 lb (74.4 kg)   BMI 30.00 kg/m   Constitutional:  Alert and oriented, no acute distress, nontoxic appearing HEENT: [place], AT Cardiovascular: No clubbing, cyanosis, or edema Respiratory: Normal respiratory effort, no increased work of breathing Skin: No rashes, bruises or suspicious lesions Neurologic: Grossly intact, no focal deficits, moving all 4 extremities Psychiatric: Normal mood and affect  Laboratory Data: Results for orders placed or performed in visit on 09/23/22  Microscopic Examination   Urine  Result Value Ref Range   WBC, UA 11-30 (A) 0 - 5 /hpf   RBC, Urine 3-10 (A) 0 - 2 /hpf   Epithelial Cells (non renal) >10 (A) 0 - 10 /hpf   Bacteria, UA Few None seen/Few  Urinalysis, Complete  Result Value Ref Range   Specific Gravity, UA 1.010 1.005 - 1.030   pH, UA 5.5 5.0 - 7.5   Color, UA Yellow Yellow   Appearance Ur Hazy (A) Clear   Leukocytes,UA Trace (A) Negative   Protein,UA Negative Negative/Trace   Glucose, UA Negative Negative   Ketones, UA Negative Negative   RBC, UA 2+ (A) Negative   Bilirubin, UA Negative Negative   Urobilinogen, Ur 0.2 0.2 - 1.0 mg/dL   Nitrite, UA Negative Negative   Microscopic Examination See below:    Assessment & Plan:   1. Flank pain with history of urolithiasis She is well-appearing today, however oral temp is elevated and she is borderline tachycardic.  UA suspicious for infection; differential includes right pyelonephritis versus infected, obstructing renal stone.   I am sending her from clinic for stat CT stone study and we gave her a dose of IM Rocephin prior to her departure.  I will call her with her results when I receive them. We discussed return precautions including fever, chills, nausea, vomiting. - Urinalysis, Complete - CULTURE, URINE COMPREHENSIVE - CT RENAL STONE STUDY; Future - cefTRIAXone (ROCEPHIN) injection 1 g   Return for Will call with results.  Carman Ching, PA-C  [facility] [place] [address] [place], [place] [address] [phone]

## 2022-09-23 NOTE — Progress Notes (Signed)
IM Injection  Patient is present today for an IM Injection for treatment of rocephin Drug: ceftriaxone Dose:1 gram, 2.1 ml of 1% lidocaine Location:[facility] Lot: [id number] Exp:06/03/2024 Patient tolerated well, no complications were noted  Performed by: Randa Lynn, RMA

## 2022-09-28 LAB — CULTURE, URINE COMPREHENSIVE

## 2022-11-16 ENCOUNTER — Ambulatory Visit (INDEPENDENT_AMBULATORY_CARE_PROVIDER_SITE_OTHER): Payer: Medicare PPO | Admitting: Physician Assistant

## 2022-11-16 ENCOUNTER — Encounter: Payer: Self-pay | Admitting: Physician Assistant

## 2022-11-16 VITALS — BP 133/86 | HR 103 | Ht 62.0 in | Wt 169.0 lb

## 2022-11-16 DIAGNOSIS — R109 Unspecified abdominal pain: Secondary | ICD-10-CM | POA: Diagnosis not present

## 2022-11-16 DIAGNOSIS — N3946 Mixed incontinence: Secondary | ICD-10-CM | POA: Diagnosis not present

## 2022-11-16 DIAGNOSIS — Z87442 Personal history of urinary calculi: Secondary | ICD-10-CM

## 2022-11-16 DIAGNOSIS — N200 Calculus of kidney: Secondary | ICD-10-CM | POA: Diagnosis not present

## 2022-11-16 LAB — BLADDER SCAN AMB NON-IMAGING: Scan Result: 0

## 2022-11-16 MED ORDER — GEMTESA 75 MG PO TABS: 75.0000 mg | ORAL_TABLET | Freq: Every day | ORAL | Status: DC

## 2022-11-16 NOTE — Progress Notes (Signed)
11/16/2022 4:25 PM   Sheryl [name] 1950-10-22 [id number]  CC: Chief Complaint  Patient presents with   Urinary Frequency   HPI: Sheryl [name] is a 72 y.o. female with PMH nephrolithiasis with a history of sepsis due to obstructing left ureteral stone in February 2024 who presents today for evaluation of urinary leakage.   Today she reports approximately 2 months of urinary urgency, frequency every 2 hours, nocturia x 2, urge incontinence, and a longer history of stress incontinence.  She used to manage her urinary leakage with pads, but has upgraded to depends and is wearing 6-7 of them daily.  They can be soaked.  She denies dysuria, gross hematuria, fever, chills, nausea, or vomiting.  She has noticed some left flank pain over the past 1 to 2 weeks.  She was unable to void on arrival with bladder scan showing 0 mL.  PMH: Past Medical History:  Diagnosis Date   Anesthesia complication    bradycardia   Anxiety    Arthritis    lower back, left hip    Chicken pox    Chronic insomnia    Chronic kidney disease    Depression    Diabetes mellitus without complication (HCC)    Esophageal dysphagia    GERD (gastroesophageal reflux disease)    Hemochromatosis carrier    History of hiatal hernia    History of kidney stones    Hypercholesterolemia    Hypertension    Migraines    rare now   Nephrolithiasis    Osteopenia    PONV (postoperative nausea and vomiting)    VOMITED A LITTLE BIT AFTER KIDNEY STONE SURGERY   Sepsis (HCC) 05/24/2022   Vitamin D deficiency     Surgical History: Past Surgical History:  Procedure Laterality Date   BREAST CYST ASPIRATION Left    negative 2012   CHOLECYSTECTOMY     COLONOSCOPY     2007, 2012   COLONOSCOPY WITH PROPOFOL N/A 12/25/2020   Procedure: COLONOSCOPY WITH PROPOFOL;  Surgeon: Midge Minium, MD;  Location: [facility];  Service: Endoscopy;  Laterality: N/A;   COLONOSCOPY, ESOPHAGOGASTRODUODENOSCOPY (EGD) AND ESOPHAGEAL  DILATION     CYSTOSCOPY W/ RETROGRADES Bilateral 12/04/2018   Procedure: CYSTOSCOPY WITH RETROGRADE PYELOGRAM;  Surgeon: Vanna Scotland, MD;  Location: [facility];  Service: Urology;  Laterality: Bilateral;   CYSTOSCOPY WITH STENT PLACEMENT Left 05/24/2022   Procedure: CYSTOSCOPY WITH STENT PLACEMENT;  Surgeon: Vanna Scotland, MD;  Location: [facility];  Service: Urology;  Laterality: Left;   CYSTOSCOPY/URETEROSCOPY/HOLMIUM LASER/STENT PLACEMENT Left 06/26/2018   Procedure: CYSTOSCOPY/URETEROSCOPY/HOLMIUM LASER/STENT Exchange;  Surgeon: Vanna Scotland, MD;  Location: [facility];  Service: Urology;  Laterality: Left;   CYSTOSCOPY/URETEROSCOPY/HOLMIUM LASER/STENT PLACEMENT Right 12/04/2018   Procedure: CYSTOSCOPY/URETEROSCOPY/HOLMIUM LASER/STENT PLACEMENT;  Surgeon: Vanna Scotland, MD;  Location: [facility];  Service: Urology;  Laterality: Right;   CYSTOSCOPY/URETEROSCOPY/HOLMIUM LASER/STENT PLACEMENT Left 06/07/2022   Procedure: CYSTOSCOPY/URETEROSCOPY/HOLMIUM LASER/STENT EXCHANGE;  Surgeon: Vanna Scotland, MD;  Location: [facility];  Service: Urology;  Laterality: Left;   DISTAL BICEPS TENDON REPAIR Right 01/19/2017   Procedure: BICEPS TENODESIS;  Surgeon: Christena Flake, MD;  Location: [facility];  Service: Orthopedics;  Laterality: Right;  Diabetic - oral meds   ESOPHAGOGASTRODUODENOSCOPY     2012, 2014, 2016, 2018   ESOPHAGOGASTRODUODENOSCOPY (EGD) WITH PROPOFOL N/A 01/31/2015   Procedure: ESOPHAGOGASTRODUODENOSCOPY (EGD) WITH PROPOFOL;  Surgeon: Scot Jun, MD;  Location: [facility];  Service: Endoscopy;  Laterality: N/A;   ESOPHAGOGASTRODUODENOSCOPY (EGD) WITH PROPOFOL N/A 06/19/2020  Procedure: ESOPHAGOGASTRODUODENOSCOPY (EGD) WITH PROPOFOL;  Surgeon: Midge Minium, MD;  Location: [facility] END[facility];  Service: Endoscopy;  Laterality: N/A;   ESOPHAGOGASTRODUODENOSCOPY (EGD) WITH PROPOFOL N/A 12/25/2020   Procedure: ESOPHAGOGASTRODUODENOSCOPY (EGD) WITH PROPOFOL;  Surgeon: Midge Minium, MD;  Location: [facility] END[facility]COPY;  Service: Endoscopy;  Laterality: N/A;   ESOPHAGOGASTRODUODENOSCOPY (EGD) WITH PROPOFOL N/A 12/22/2021   Procedure: ESOPHAGOGASTRODUODENOSCOPY (EGD) WITH PROPOFOL;  Surgeon: Midge Minium, MD;  Location: [facility];  Service: Endoscopy;  Laterality: N/A;   ESOPHAGOGASTRODUODENOSCOPY (EGD) WITH PROPOFOL N/A 01/28/2022   Procedure: ESOPHAGOGASTRODUODENOSCOPY (EGD) WITH PROPOFOL;  Surgeon: Midge Minium, MD;  Location: [facility];  Service: Endoscopy;  Laterality: N/A;   HERNIA REPAIR     IR NEPHROSTOMY PLACEMENT LEFT  05/22/2018   LITHOTRIPSY     NEPHROLITHOTOMY Left 05/22/2018   Procedure: NEPHROLITHOTOMY PERCUTANEOUS;  Surgeon: Vanna Scotland, MD;  Location: [facility];  Service: Urology;  Laterality: Left;   SAVORY DILATION N/A 01/31/2015   Procedure: SAVORY DILATION;  Surgeon: Scot Jun, MD;  Location: [facility] ENDOSCOPY;  Service: Endoscopy;  Laterality: N/A;   SHOULDER ARTHROSCOPY Right 01/19/2017   Procedure: ARTHROSCOPY SHOULDER DEBRIDEMENT DECOMPRESSION AND  ROTATOR CUFF REPAIR AND BICEPS TENODESIS;  Surgeon: Christena Flake, MD;  Location: [facility];  Service: Orthopedics;  Laterality: Right;   TOTAL HIP ARTHROPLASTY Right 03/18/2022   Procedure: Right posterior total hip arthroplasty;  Surgeon: Reinaldo Berber, MD;  Location: [facility];  Service: Orthopedics;  Laterality: Right;    Home Medications:  Allergies as of 11/16/2022   No Known Allergies      Medication List        Accurate as of November 16, 2022  4:25 PM. If you have any questions, ask your nurse or doctor.          STOP taking these medications    DELSYM DAY NIGHT PO Stopped by: Carman Ching       TAKE these medications    acetaminophen 500 MG tablet Commonly known as: TYLENOL Take 2 tablets (1,000 mg total) by mouth every 8 (eight) hours.   buPROPion 300 MG 24 hr tablet Commonly known as: WELLBUTRIN XL Take 300 mg by mouth daily.    CORICIDIN D PO Take 1 tablet by mouth as needed.   cyanocobalamin 1000 MCG tablet Commonly known as: VITAMIN B12 Take 1,000 mcg by mouth daily.   DULoxetine 60 MG capsule Commonly known as: CYMBALTA Take 60 mg by mouth daily.   fluconazole 150 MG tablet Commonly known as: Diflucan Take 1 tablet (150 mg total) by mouth daily.   HYDROcodone-acetaminophen 5-325 MG tablet Commonly known as: NORCO/VICODIN Take 1-2 tablets by mouth every 6 (six) hours as needed for moderate pain.   lisinopril-hydrochlorothiazide 20-25 MG tablet Commonly known as: ZESTORETIC Take 0.5 tablets by mouth daily.   meloxicam 15 MG tablet Commonly known as: MOBIC Take 15 mg by mouth daily as needed for pain.   metFORMIN 850 MG tablet Commonly known as: GLUCOPHAGE Take 850 mg by mouth 2 (two) times daily with a meal.   oxybutynin 5 MG tablet Commonly known as: DITROPAN Take 1 tablet (5 mg total) by mouth every 8 (eight) hours as needed for bladder spasms.   pantoprazole 40 MG tablet Commonly known as: PROTONIX Take 40 mg by mouth at bedtime.   potassium chloride 8 MEQ tablet Commonly known as: KLOR-CON 8 mEq daily.   propranolol ER 60 MG 24 hr capsule Commonly known as: INDERAL LA Take 60 mg by mouth daily.  rosuvastatin 5 MG tablet Commonly known as: CRESTOR Take 5 mg by mouth at bedtime.   tamsulosin 0.4 MG Caps capsule Commonly known as: Flomax Take 1 capsule (0.4 mg total) by mouth daily.   traMADol 50 MG tablet Commonly known as: ULTRAM Take 1 tablet (50 mg total) by mouth every 6 (six) hours as needed for moderate pain.   traZODone 50 MG tablet Commonly known as: DESYREL Take 50 mg by mouth at bedtime.   Vitamin D3 50 MCG (2000 UT) capsule Take 2,000 Units by mouth daily.        Allergies:  No Known Allergies  Family History: No family history on file.  Social History:   reports that she quit smoking about 14 years ago. Her smoking use included cigarettes. She  started smoking about 39 years ago. She has a 37.5 pack-year smoking history. She has never used smokeless tobacco. She reports that she does not drink alcohol and does not use drugs.  Physical Exam: BP 133/86   Pulse (!) 103   Ht 5\' 2"  (1.575 m)   Wt 169 lb (76.7 kg)   BMI 30.91 kg/m   Constitutional:  Alert and oriented, no acute distress, nontoxic appearing HEENT: [place], AT Cardiovascular: No clubbing, cyanosis, or edema Respiratory: Normal respiratory effort, no increased work of breathing Skin: No rashes, bruises or suspicious lesions Neurologic: Grossly intact, no focal deficits, moving all 4 extremities Psychiatric: Normal mood and affect  Laboratory Data: Results for orders placed or performed in visit on 11/16/22  Bladder Scan (Post Void Residual) in office  Result Value Ref Range   Scan Result 0    Assessment & Plan:   1. 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  I provided her with Gemtesa samples today and will plan for symptom recheck at her next scheduled follow-up with Dr. Apolinar Junes.  I also asked her to leave a urine specimen at the end of her visit today to rule out infection and will plan to treat as indicated. - Bladder Scan (Post Void Residual) in office - Urinalysis, Complete - Vibegron (GEMTESA) 75 MG TABS; Take 1 tablet (75 mg total) by mouth daily.  2. Flank pain with history of urolithiasis Will obtain renal ultrasound to rule out hydronephrosis, though overall low suspicion for distal stone manifesting in OAB symptoms as above. - US RENAL; Future   Return in about 6 weeks (around 12/28/2022) for Symptom recheck with PVR.  Carman Ching, PA-C  [facility] [place] [address], [address] [place], [place] [address] [phone]

## 2022-11-25 ENCOUNTER — Ambulatory Visit: Admission: RE | Admit: 2022-11-25 | Payer: Medicare PPO | Source: Ambulatory Visit

## 2023-01-06 ENCOUNTER — Other Ambulatory Visit: Payer: Self-pay

## 2023-01-06 DIAGNOSIS — N200 Calculus of kidney: Secondary | ICD-10-CM

## 2023-01-11 ENCOUNTER — Ambulatory Visit
Admission: RE | Admit: 2023-01-11 | Discharge: 2023-01-11 | Disposition: A | Discharge status: Home / Self Care | Payer: Medicare PPO | Source: Ambulatory Visit | Attending: Urology

## 2023-01-11 ENCOUNTER — Ambulatory Visit: Payer: Medicare PPO | Admitting: Urology

## 2023-01-11 ENCOUNTER — Ambulatory Visit
Admission: RE | Admit: 2023-01-11 | Discharge: 2023-01-11 | Disposition: A | Discharge status: Home / Self Care | Payer: Medicare PPO | Attending: Urology | Admitting: Urology

## 2023-01-11 VITALS — BP 120/71 | HR 70 | Ht 62.0 in | Wt 170.2 lb

## 2023-01-11 DIAGNOSIS — N200 Calculus of kidney: Secondary | ICD-10-CM

## 2023-01-11 DIAGNOSIS — N3946 Mixed incontinence: Secondary | ICD-10-CM | POA: Diagnosis not present

## 2023-01-11 DIAGNOSIS — E119 Type 2 diabetes mellitus without complications: Secondary | ICD-10-CM | POA: Diagnosis not present

## 2023-01-11 NOTE — Progress Notes (Signed)
Sheryl [name],acting as a scribe for Sheryl Scotland, MD.,have documented all relevant documentation on the behalf of Sheryl Scotland, MD,as directed by  Sheryl Scotland, MD while in the presence of Sheryl Scotland, MD.  01/11/2023 10:31 AM   Sheryl [name] [birth year] [id number]  Referring provider: Luciana Axe, NP [address] [place],  [place] [address]  Chief Complaint  Patient presents with   Nephrolithiasis    HPI: 72 year-old female with a personal history of recurrent nephrolithiasis who returns today for follow up.   She underwent ureteroscopy in 06/2022.   She also recently saw Hilton Sinclair for mixed urinary incontinence and started on gemtesa. Since starting gemtesa, she has noticed some improvement in her symptoms and it has reduced the number of pads she uses per day. She notes that this has been a chronic issue but has been worsening recently.   She follows up today with a KUB. She has some bilateral non-obstructing stone burden on the left measuring about 3 mm largest diameter as well as slightly larger right lower pole stone which is non-obstructing. It is essentially stable from her CT scan in 09/2022.   She continues to drink primarily Coke and reports not drinking enough at baseline in general.   PMH: Past Medical History:  Diagnosis Date   Anesthesia complication    bradycardia   Anxiety    Arthritis    lower back, left hip    Chicken pox    Chronic insomnia    Chronic kidney disease    Depression    Diabetes mellitus without complication (HCC)    Esophageal dysphagia    GERD (gastroesophageal reflux disease)    Hemochromatosis carrier    History of hiatal hernia    History of kidney stones    Hypercholesterolemia    Hypertension    Migraines    rare now   Nephrolithiasis    Osteopenia    PONV (postoperative nausea and vomiting)    VOMITED A LITTLE BIT AFTER KIDNEY STONE SURGERY   Sepsis (HCC) 05/24/2022   Vitamin D deficiency      Surgical History: Past Surgical History:  Procedure Laterality Date   BREAST CYST ASPIRATION Left    negative 2012   CHOLECYSTECTOMY     COLONOSCOPY     2007, 2012   COLONOSCOPY WITH PROPOFOL N/A 12/25/2020   Procedure: COLONOSCOPY WITH PROPOFOL;  Surgeon: Midge Minium, MD;  Location: [facility];  Service: Endoscopy;  Laterality: N/A;   COLONOSCOPY, ESOPHAGOGASTRODUODENOSCOPY (EGD) AND ESOPHAGEAL DILATION     CYSTOSCOPY W/ RETROGRADES Bilateral 12/04/2018   Procedure: CYSTOSCOPY WITH RETROGRADE PYELOGRAM;  Surgeon: Sheryl Scotland, MD;  Location: [facility];  Service: Urology;  Laterality: Bilateral;   CYSTOSCOPY WITH STENT PLACEMENT Left 05/24/2022   Procedure: CYSTOSCOPY WITH STENT PLACEMENT;  Surgeon: Sheryl Scotland, MD;  Location: [facility];  Service: Urology;  Laterality: Left;   CYSTOSCOPY/URETEROSCOPY/HOLMIUM LASER/STENT PLACEMENT Left 06/26/2018   Procedure: CYSTOSCOPY/URETEROSCOPY/HOLMIUM LASER/STENT Exchange;  Surgeon: Sheryl Scotland, MD;  Location: [facility];  Service: Urology;  Laterality: Left;   CYSTOSCOPY/URETEROSCOPY/HOLMIUM LASER/STENT PLACEMENT Right 12/04/2018   Procedure: CYSTOSCOPY/URETEROSCOPY/HOLMIUM LASER/STENT PLACEMENT;  Surgeon: Sheryl Scotland, MD;  Location: [facility];  Service: Urology;  Laterality: Right;   CYSTOSCOPY/URETEROSCOPY/HOLMIUM LASER/STENT PLACEMENT Left 06/07/2022   Procedure: CYSTOSCOPY/URETEROSCOPY/HOLMIUM LASER/STENT EXCHANGE;  Surgeon: Sheryl Scotland, MD;  Location: [facility];  Service: Urology;  Laterality: Left;   DISTAL BICEPS TENDON REPAIR Right 01/19/2017   Procedure: BICEPS TENODESIS;  Surgeon: Christena Flake, MD;  Location: [facility];  Service: Orthopedics;  Laterality: Right;  Diabetic - oral meds   ESOPHAGOGASTRODUODENOSCOPY     2012, 2014, 2016, 2018   ESOPHAGOGASTRODUODENOSCOPY (EGD) WITH PROPOFOL N/A 01/31/2015   Procedure: ESOPHAGOGASTRODUODENOSCOPY (EGD) WITH PROPOFOL;  Surgeon: Scot Jun, MD;  Location: [facility]  ENDOSCOPY;  Service: Endoscopy;  Laterality: N/A;   ESOPHAGOGASTRODUODENOSCOPY (EGD) WITH PROPOFOL N/A 06/19/2020   Procedure: ESOPHAGOGASTRODUODENOSCOPY (EGD) WITH PROPOFOL;  Surgeon: Midge Minium, MD;  Location: [facility];  Service: Endoscopy;  Laterality: N/A;   ESOPHAGOGASTRODUODENOSCOPY (EGD) WITH PROPOFOL N/A 12/25/2020   Procedure: ESOPHAGOGASTRODUODENOSCOPY (EGD) WITH PROPOFOL;  Surgeon: Midge Minium, MD;  Location: [facility] ENDOSCOPY;  Service: Endoscopy;  Laterality: N/A;   ESOPHAGOGASTRODUODENOSCOPY (EGD) WITH PROPOFOL N/A 12/22/2021   Procedure: ESOPHAGOGASTRODUODENOSCOPY (EGD) WITH PROPOFOL;  Surgeon: Midge Minium, MD;  Location: [facility];  Service: Endoscopy;  Laterality: N/A;   ESOPHAGOGASTRODUODENOSCOPY (EGD) WITH PROPOFOL N/A 01/28/2022   Procedure: ESOPHAGOGASTRODUODENOSCOPY (EGD) WITH PROPOFOL;  Surgeon: Midge Minium, MD;  Location: [facility];  Service: Endoscopy;  Laterality: N/A;   HERNIA REPAIR     IR NEPHROSTOMY PLACEMENT LEFT  05/22/2018   LITHOTRIPSY     NEPHROLITHOTOMY Left 05/22/2018   Procedure: NEPHROLITHOTOMY PERCUTANEOUS;  Surgeon: Sheryl Scotland, MD;  Location: [facility];  Service: Urology;  Laterality: Left;   SAVORY DILATION N/A 01/31/2015   Procedure: SAVORY DILATION;  Surgeon: Scot Jun, MD;  Location: [facility]OSCOPY;  Service: Endoscopy;  Laterality: N/A;   SHOULDER ARTHROSCOPY Right 01/19/2017   Procedure: ARTHROSCOPY SHOULDER DEBRIDEMENT DECOMPRESSION AND  ROTATOR CUFF REPAIR AND BICEPS TENODESIS;  Surgeon: Christena Flake, MD;  Location: [facility];  Service: Orthopedics;  Laterality: Right;   TOTAL HIP ARTHROPLASTY Right 03/18/2022   Procedure: Right posterior total hip arthroplasty;  Surgeon: Reinaldo Berber, MD;  Location: [facility];  Service: Orthopedics;  Laterality: Right;    Home Medications:  Allergies as of 01/11/2023   No Known Allergies      Medication List        Accurate as of January 11, 2023 10:31 AM. If  you have any questions, ask your nurse or doctor.          STOP taking these medications    CORICIDIN D PO   fluconazole 150 MG tablet Commonly known as: Diflucan   HYDROcodone-acetaminophen 5-325 MG tablet Commonly known as: NORCO/VICODIN   tamsulosin 0.4 MG Caps capsule Commonly known as: Flomax   traMADol 50 MG tablet Commonly known as: ULTRAM       TAKE these medications    acetaminophen 500 MG tablet Commonly known as: TYLENOL Take 2 tablets (1,000 mg total) by mouth every 8 (eight) hours.   buPROPion 300 MG 24 hr tablet Commonly known as: WELLBUTRIN XL Take 300 mg by mouth daily.   cyanocobalamin 1000 MCG tablet Commonly known as: VITAMIN B12 Take 1,000 mcg by mouth daily.   DULoxetine 60 MG capsule Commonly known as: CYMBALTA Take 60 mg by mouth daily.   Gemtesa 75 MG Tabs Generic drug: Vibegron Take 1 tablet (75 mg total) by mouth daily.   lisinopril-hydrochlorothiazide 20-25 MG tablet Commonly known as: ZESTORETIC Take 0.5 tablets by mouth daily.   meloxicam 15 MG tablet Commonly known as: MOBIC Take 15 mg by mouth daily as needed for pain.   metFORMIN 850 MG tablet Commonly known as: GLUCOPHAGE Take 850 mg by mouth 2 (two) times daily with a meal.   oxybutynin 5 MG tablet Commonly known as: DITROPAN Take 1 tablet (5 mg  total) by mouth every 8 (eight) hours as needed for bladder spasms.   pantoprazole 40 MG tablet Commonly known as: PROTONIX Take 40 mg by mouth at bedtime.   potassium chloride 8 MEQ tablet Commonly known as: KLOR-CON 8 mEq daily.   propranolol ER 60 MG 24 hr capsule Commonly known as: INDERAL LA Take 60 mg by mouth daily.   rosuvastatin 5 MG tablet Commonly known as: CRESTOR Take 5 mg by mouth at bedtime.   traZODone 50 MG tablet Commonly known as: DESYREL Take 50 mg by mouth at bedtime.   Vitamin D3 50 MCG (2000 UT) capsule Take 2,000 Units by mouth daily.        Social History:  reports that she quit  smoking about 14 years ago. Her smoking use included cigarettes. She started smoking about 39 years ago. She has a 37.5 pack-year smoking history. She has never used smokeless tobacco. She reports that she does not drink alcohol and does not use drugs.   Physical Exam: BP 120/71   Pulse 70   Ht 5\' 2"  (1.575 m)   Wt 170 lb 4 oz (77.2 kg)   BMI 31.14 kg/m   Constitutional:  Alert and oriented, No acute distress. HEENT: [place] AT, moist mucus membranes.  Trachea midline, no masses. Neurologic: Grossly intact, no focal deficits, moving all 4 extremities. Psychiatric: Normal mood and affect.   Pertinent Imaging:  KUB performed today. Radiologic interpretation is pending. I personally reviewed the imaging today. She has some bilateral non-obstructing stone burden on the left measuring about 3 mm largest diameter as well as slightly larger right lower pole stone which is non-obstructing. It is essentially stable from her CT scan in 09/2022.    EXAM: CT ABDOMEN AND PELVIS WITHOUT CONTRAST  TECHNIQUE: Multidetector CT imaging of the abdomen and pelvis was performed following the standard protocol without IV contrast.  RADIATION DOSE REDUCTION: This exam was performed according to the departmental dose-optimization program which includes automated exposure control, adjustment of the mA and/or kV according to patient size and/or use of iterative reconstruction technique.  COMPARISON:  CT abdomen pelvis dated May 24, 2022.  FINDINGS: Lower chest: No acute abnormality.  Hepatobiliary: No focal liver abnormality is seen. Status post cholecystectomy. No biliary dilatation.  Pancreas: Unremarkable. No pancreatic ductal dilatation or surrounding inflammatory changes.  Spleen: Normal in size without focal abnormality.  Adrenals/Urinary Tract: The adrenal glands are unremarkable. Multiple bilateral renal calculi. Previously seen 1 cm calculus in the left renal pelvis no longer  identified. Mild right perinephric and periureteral fat stranding. No hydronephrosis. Unchanged 4 cm right renal simple cyst. No follow-up imaging is recommended. The bladder is mostly decompressed.  Stomach/Bowel: Unchanged moderate hiatal hernia. The stomach is otherwise within normal limits. No bowel wall thickening, distention, or surrounding inflammatory changes. Normal appendix.  Vascular/Lymphatic: Aortic atherosclerosis. No enlarged abdominal or pelvic lymph nodes.  Reproductive: Uterus and bilateral adnexa are unremarkable.  Other: No abdominal wall hernia or abnormality. No abdominopelvic ascites. No pneumoperitoneum.  Musculoskeletal: No acute or significant osseous findings.  IMPRESSION: 1. Mild right perinephric and periureteral fat stranding without hydronephrosis. Appearance could reflect recently passed renal calculus. Also correlate with urinalysis to exclude urinary tract infection. 2. Bilateral nonobstructive nephrolithiasis. 3. Unchanged moderate hiatal hernia. 4.  Aortic Atherosclerosis (ICD10-I70.0).   Electronically Signed By: Obie Dredge M.D. On: 09/23/2022 17:11  This was personally reviewed and I agree with the radiologic interpretation.  Assessment & Plan:    1. Recurrent nephrolithiasis -  Continue monitoring stone burden with annual imaging.  - Stable stone burden - We had another lengthy discussion about behavior modification as she drinks primarily Coke and not many other fluids  2. Mixed urinary incontinence - Improved on Gemtesa 75 mg but still symptomatic - Again, behavior change would benefit her   Return in about 1 year (around 01/11/2024) for repeat KUB.  I have reviewed the above documentation for accuracy and completeness, and I agree with the above.   Sheryl Scotland, MD   [facility] [address] [place], [place] [address] [phone]

## 2023-02-03 DIAGNOSIS — B0230 Zoster ocular disease, unspecified: Secondary | ICD-10-CM | POA: Diagnosis not present

## 2023-02-03 DIAGNOSIS — H10022 Other mucopurulent conjunctivitis, left eye: Secondary | ICD-10-CM | POA: Diagnosis not present

## 2023-02-04 DIAGNOSIS — H00025 Hordeolum internum left lower eyelid: Secondary | ICD-10-CM | POA: Diagnosis not present

## 2023-02-11 ENCOUNTER — Telehealth: Payer: Self-pay | Admitting: Gastroenterology

## 2023-02-11 ENCOUNTER — Other Ambulatory Visit: Payer: Self-pay

## 2023-02-11 NOTE — Telephone Encounter (Signed)
Pt requesting call back to discuss ENDO  having throat pain again

## 2023-02-11 NOTE — Telephone Encounter (Signed)
Pt reports having difficulty swallowing especially with food again and she states that you told her to just give [place] a call to schedule EGD if Sx return... Please advise if okay to schedule EGD    11/19

## 2023-02-15 DIAGNOSIS — H02885 Meibomian gland dysfunction left lower eyelid: Secondary | ICD-10-CM | POA: Diagnosis not present

## 2023-02-15 DIAGNOSIS — H02883 Meibomian gland dysfunction of right eye, unspecified eyelid: Secondary | ICD-10-CM | POA: Diagnosis not present

## 2023-02-15 NOTE — Telephone Encounter (Signed)
Patient is calling because is ready to set up her EGD because she states her esophagus is messing up pretty bad now

## 2023-02-16 ENCOUNTER — Other Ambulatory Visit: Payer: Self-pay

## 2023-02-16 DIAGNOSIS — K222 Esophageal obstruction: Secondary | ICD-10-CM

## 2023-02-16 NOTE — Telephone Encounter (Signed)
Pt returning call to schedule endoscopy

## 2023-02-17 DIAGNOSIS — I10 Essential (primary) hypertension: Secondary | ICD-10-CM | POA: Diagnosis not present

## 2023-02-17 DIAGNOSIS — R251 Tremor, unspecified: Secondary | ICD-10-CM | POA: Diagnosis not present

## 2023-02-17 DIAGNOSIS — I1310 Hypertensive heart and chronic kidney disease without heart failure, with stage 1 through stage 4 chronic kidney disease, or unspecified chronic kidney disease: Secondary | ICD-10-CM | POA: Diagnosis not present

## 2023-02-17 DIAGNOSIS — N1832 Chronic kidney disease, stage 3b: Secondary | ICD-10-CM | POA: Diagnosis not present

## 2023-02-17 DIAGNOSIS — M8589 Other specified disorders of bone density and structure, multiple sites: Secondary | ICD-10-CM | POA: Diagnosis not present

## 2023-02-17 DIAGNOSIS — Z23 Encounter for immunization: Secondary | ICD-10-CM | POA: Diagnosis not present

## 2023-02-17 DIAGNOSIS — E559 Vitamin D deficiency, unspecified: Secondary | ICD-10-CM | POA: Diagnosis not present

## 2023-02-17 DIAGNOSIS — R197 Diarrhea, unspecified: Secondary | ICD-10-CM | POA: Diagnosis not present

## 2023-02-17 DIAGNOSIS — Z9889 Other specified postprocedural states: Secondary | ICD-10-CM | POA: Diagnosis not present

## 2023-02-17 DIAGNOSIS — F5104 Psychophysiologic insomnia: Secondary | ICD-10-CM | POA: Diagnosis not present

## 2023-02-17 DIAGNOSIS — Z0000 Encounter for general adult medical examination without abnormal findings: Secondary | ICD-10-CM | POA: Diagnosis not present

## 2023-02-17 DIAGNOSIS — E7800 Pure hypercholesterolemia, unspecified: Secondary | ICD-10-CM | POA: Diagnosis not present

## 2023-02-17 DIAGNOSIS — E669 Obesity, unspecified: Secondary | ICD-10-CM | POA: Diagnosis not present

## 2023-02-17 DIAGNOSIS — E66811 Obesity, class 1: Secondary | ICD-10-CM | POA: Diagnosis not present

## 2023-02-17 DIAGNOSIS — E538 Deficiency of other specified B group vitamins: Secondary | ICD-10-CM | POA: Diagnosis not present

## 2023-02-17 DIAGNOSIS — Z1331 Encounter for screening for depression: Secondary | ICD-10-CM | POA: Diagnosis not present

## 2023-02-17 DIAGNOSIS — E119 Type 2 diabetes mellitus without complications: Secondary | ICD-10-CM | POA: Diagnosis not present

## 2023-02-17 DIAGNOSIS — F419 Anxiety disorder, unspecified: Secondary | ICD-10-CM | POA: Diagnosis not present

## 2023-02-17 DIAGNOSIS — R002 Palpitations: Secondary | ICD-10-CM | POA: Diagnosis not present

## 2023-02-17 DIAGNOSIS — F4321 Adjustment disorder with depressed mood: Secondary | ICD-10-CM | POA: Diagnosis not present

## 2023-02-17 DIAGNOSIS — E1122 Type 2 diabetes mellitus with diabetic chronic kidney disease: Secondary | ICD-10-CM | POA: Diagnosis not present

## 2023-02-28 ENCOUNTER — Encounter: Payer: Self-pay | Admitting: Gastroenterology

## 2023-03-07 ENCOUNTER — Other Ambulatory Visit: Payer: Self-pay

## 2023-03-07 ENCOUNTER — Ambulatory Visit: Payer: Medicare PPO | Admitting: Anesthesiology

## 2023-03-07 ENCOUNTER — Encounter
Admission: RE | Disposition: A | Discharge status: Home / Self Care | Payer: Self-pay | Source: Home / Self Care | Attending: Gastroenterology

## 2023-03-07 ENCOUNTER — Ambulatory Visit
Admission: RE | Admit: 2023-03-07 | Discharge: 2023-03-07 | Disposition: A | Discharge status: Home / Self Care | Payer: Medicare PPO | Attending: Gastroenterology | Admitting: Gastroenterology

## 2023-03-07 ENCOUNTER — Encounter: Payer: Self-pay | Admitting: Gastroenterology

## 2023-03-07 DIAGNOSIS — K222 Esophageal obstruction: Secondary | ICD-10-CM | POA: Diagnosis not present

## 2023-03-07 DIAGNOSIS — R1310 Dysphagia, unspecified: Secondary | ICD-10-CM | POA: Insufficient documentation

## 2023-03-07 DIAGNOSIS — K2950 Unspecified chronic gastritis without bleeding: Secondary | ICD-10-CM

## 2023-03-07 DIAGNOSIS — Z7984 Long term (current) use of oral hypoglycemic drugs: Secondary | ICD-10-CM | POA: Insufficient documentation

## 2023-03-07 DIAGNOSIS — Z87891 Personal history of nicotine dependence: Secondary | ICD-10-CM | POA: Insufficient documentation

## 2023-03-07 DIAGNOSIS — K319 Disease of stomach and duodenum, unspecified: Secondary | ICD-10-CM | POA: Diagnosis not present

## 2023-03-07 DIAGNOSIS — N189 Chronic kidney disease, unspecified: Secondary | ICD-10-CM | POA: Diagnosis not present

## 2023-03-07 DIAGNOSIS — K259 Gastric ulcer, unspecified as acute or chronic, without hemorrhage or perforation: Secondary | ICD-10-CM | POA: Diagnosis not present

## 2023-03-07 DIAGNOSIS — K2970 Gastritis, unspecified, without bleeding: Secondary | ICD-10-CM | POA: Diagnosis not present

## 2023-03-07 DIAGNOSIS — E1122 Type 2 diabetes mellitus with diabetic chronic kidney disease: Secondary | ICD-10-CM | POA: Insufficient documentation

## 2023-03-07 DIAGNOSIS — I129 Hypertensive chronic kidney disease with stage 1 through stage 4 chronic kidney disease, or unspecified chronic kidney disease: Secondary | ICD-10-CM | POA: Insufficient documentation

## 2023-03-07 DIAGNOSIS — K449 Diaphragmatic hernia without obstruction or gangrene: Secondary | ICD-10-CM | POA: Insufficient documentation

## 2023-03-07 HISTORY — PX: ESOPHAGEAL DILATION: SHX303

## 2023-03-07 HISTORY — PX: ESOPHAGOGASTRODUODENOSCOPY (EGD) WITH PROPOFOL: SHX5813

## 2023-03-07 HISTORY — DX: Dizziness and giddiness: R42

## 2023-03-07 SURGERY — ESOPHAGOGASTRODUODENOSCOPY (EGD) WITH PROPOFOL
Anesthesia: General

## 2023-03-07 MED ORDER — SODIUM CHLORIDE 0.9% FLUSH: 10.0000 mL | Freq: Two times a day (BID) | INTRAVENOUS | Status: DC

## 2023-03-07 MED ORDER — SODIUM CHLORIDE 0.9 % IV SOLN: INTRAVENOUS | Status: DC

## 2023-03-07 MED ORDER — LACTATED RINGERS IV SOLN: INTRAVENOUS | Status: DC | PRN

## 2023-03-07 MED ORDER — STERILE WATER FOR IRRIGATION IR SOLN
Status: DC | PRN
  Administered 2023-03-07: 50 mL

## 2023-03-07 MED ORDER — LIDOCAINE HCL (CARDIAC) PF 100 MG/5ML IV SOSY
PREFILLED_SYRINGE | INTRAVENOUS | Status: DC | PRN
  Administered 2023-03-07: 60 mg via INTRAVENOUS

## 2023-03-07 MED ORDER — PROPOFOL 10 MG/ML IV BOLUS
INTRAVENOUS | Status: DC | PRN
  Administered 2023-03-07: 30 mg via INTRAVENOUS
  Administered 2023-03-07: 100 mg via INTRAVENOUS
  Administered 2023-03-07: 20 mg via INTRAVENOUS

## 2023-03-07 SURGICAL SUPPLY — 8 items
BALLN DILATOR 12-15 8 (BALLOONS) ×2
BALLOON DILATOR 12-15 8 (BALLOONS) IMPLANT
BLOCK BITE 60FR ADLT L/F GRN (MISCELLANEOUS) ×2 IMPLANT
FORCEPS BIOP RAD 4 LRG CAP 4 (CUTTING FORCEPS) IMPLANT
GOWN CVR UNV OPN BCK APRN NK (MISCELLANEOUS) ×4 IMPLANT
MANIFOLD NEPTUNE II (INSTRUMENTS) ×2 IMPLANT
SYR INFLATION 60ML (SYRINGE) IMPLANT
WATER STERILE IRR 250ML POUR (IV SOLUTION) ×2 IMPLANT

## 2023-03-07 NOTE — Anesthesia Postprocedure Evaluation (Signed)
Anesthesia Post Note  Patient: Sheryl [name]  Procedure(s) Performed: ESOPHAGOGASTRODUODENOSCOPY (EGD) WITH PROPOFOL ESOPHAGEAL DILATION  Patient location during evaluation: PACU Anesthesia Type: General Level of consciousness: awake and alert Pain management: pain level controlled Vital Signs Assessment: post-procedure vital signs reviewed and stable Respiratory status: spontaneous breathing, nonlabored ventilation, respiratory function stable and patient connected to nasal cannula oxygen Cardiovascular status: blood pressure returned to baseline and stable Postop Assessment: no apparent nausea or vomiting Anesthetic complications: no   No notable events documented.   Last Vitals:  Vitals:   03/07/23 0915 03/07/23 0925  BP: 112/62 (!) 124/53  Pulse: 74 78  Resp: 17 17  Temp:  36.7 C  SpO2: 96% 97%    Last Pain:  Vitals:   03/07/23 0925  TempSrc:   PainSc: 0-No pain                 Yevette Edwards

## 2023-03-07 NOTE — Transfer of Care (Signed)
Immediate Anesthesia Transfer of Care Note  Patient: Sheryl [name]  Procedure(s) Performed: ESOPHAGOGASTRODUODENOSCOPY (EGD) WITH PROPOFOL ESOPHAGEAL DILATION  Patient Location: PACU  Anesthesia Type: General  Level of Consciousness: awake, alert  and patient cooperative  Airway and Oxygen Therapy: Patient Spontanous Breathing and Patient connected to supplemental oxygen  Post-op Assessment: Post-op Vital signs reviewed, Patient's Cardiovascular Status Stable, Respiratory Function Stable, Patent Airway and No signs of Nausea or vomiting  Post-op Vital Signs: Reviewed and stable  Complications: No notable events documented.

## 2023-03-07 NOTE — H&P (Signed)
Midge Minium, MD [facility] [address] [place], [place] [address] Phone:[phone] Fax : [phone]  Primary Care Physician:  Luciana Axe, NP Primary Gastroenterologist:  Dr. Servando Snare  Pre-Procedure History & Physical: HPI:  Sheryl [name] is a 72 y.o. female is here for an endoscopy.   Past Medical History:  Diagnosis Date   Anesthesia complication    bradycardia   Anxiety    Arthritis    lower back, left hip    Chicken pox    Chronic insomnia    Chronic kidney disease    Depression    Diabetes mellitus without complication (HCC)    Esophageal dysphagia    GERD (gastroesophageal reflux disease)    Hemochromatosis carrier    History of hiatal hernia    History of kidney stones    Hypercholesterolemia    Hypertension    Migraines    rare now   Nephrolithiasis    Osteopenia    PONV (postoperative nausea and vomiting)    VOMITED A LITTLE BIT AFTER KIDNEY STONE SURGERY   Sepsis (HCC) 05/24/2022   Vertigo    Vitamin D deficiency     Past Surgical History:  Procedure Laterality Date   BREAST CYST ASPIRATION Left    negative 2012   CHOLECYSTECTOMY     COLONOSCOPY     2007, 2012   COLONOSCOPY WITH PROPOFOL N/A 12/25/2020   Procedure: COLONOSCOPY WITH PROPOFOL;  Surgeon: Midge Minium, MD;  Location: [facility];  Service: Endoscopy;  Laterality: N/A;   COLONOSCOPY, ESOPHAGOGASTRODUODENOSCOPY (EGD) AND ESOPHAGEAL DILATION     CYSTOSCOPY W/ RETROGRADES Bilateral 12/04/2018   Procedure: CYSTOSCOPY WITH RETROGRADE PYELOGRAM;  Surgeon: Vanna Scotland, MD;  Location: [facility];  Service: Urology;  Laterality: Bilateral;   CYSTOSCOPY WITH STENT PLACEMENT Left 05/24/2022   Procedure: CYSTOSCOPY WITH STENT PLACEMENT;  Surgeon: Vanna Scotland, MD;  Location: [facility];  Service: Urology;  Laterality: Left;   CYSTOSCOPY/URETEROSCOPY/HOLMIUM LASER/STENT PLACEMENT Left 06/26/2018   Procedure: CYSTOSCOPY/URETEROSCOPY/HOLMIUM LASER/STENT Exchange;  Surgeon: Vanna Scotland, MD;  Location: [facility];  Service: Urology;  Laterality: Left;   CYSTOSCOPY/URETEROSCOPY/HOLMIUM LASER/STENT PLACEMENT Right 12/04/2018   Procedure: CYSTOSCOPY/URETEROSCOPY/HOLMIUM LASER/STENT PLACEMENT;  Surgeon: Vanna Scotland, MD;  Location: [facility];  Service: Urology;  Laterality: Right;   CYSTOSCOPY/URETEROSCOPY/HOLMIUM LASER/STENT PLACEMENT Left 06/07/2022   Procedure: CYSTOSCOPY/URETEROSCOPY/HOLMIUM LASER/STENT EXCHANGE;  Surgeon: Vanna Scotland, MD;  Location: [facility];  Service: Urology;  Laterality: Left;   DISTAL BICEPS TENDON REPAIR Right 01/19/2017   Procedure: BICEPS TENODESIS;  Surgeon: Christena Flake, MD;  [facility];  Service: Orthopedics;  Laterality: Right;  Diabetic - oral meds   ESOPHAGOGASTRODUODENOSCOPY     2012, 2014, 2016, 2018   ESOPHAGOGASTRODUODENOSCOPY (EGD) WITH PROPOFOL N/A 01/31/2015   Procedure: ESOPHAGOGASTRODUODENOSCOPY (EGD) WITH PROPOFOL;  Surgeon: Scot Jun, MD;  Location: [facility] END[facility]COPY;  Service: Endoscopy;  Laterality: N/A;   ESOPHAGOGASTRODUODENOSCOPY (EGD) WITH PROPOFOL N/A 06/19/2020   Procedure: ESOPHAGOGASTRODUODENOSCOPY (EGD) WITH PROPOFOL;  Surgeon: Midge Minium, MD;  Location: [facility];  Service: Endoscopy;  Laterality: N/A;   ESOPHAGOGASTRODUODENOSCOPY (EGD) WITH PROPOFOL N/A 12/25/2020   Procedure: ESOPHAGOGASTRODUODENOSCOPY (EGD) WITH PROPOFOL;  Surgeon: Midge Minium, MD;  Location: [facility] ENDOSCOPY;  Service: Endoscopy;  Laterality: N/A;   ESOPHAGOGASTRODUODENOSCOPY (EGD) WITH PROPOFOL N/A 12/22/2021   Procedure: ESOPHAGOGASTRODUODENOSCOPY (EGD) WITH PROPOFOL;  Surgeon: Midge Minium, MD;  Location: [facility];  Service: Endoscopy;  Laterality: N/A;   ESOPHAGOGASTRODUODENOSCOPY (EGD) WITH PROPOFOL N/A 01/28/2022   Procedure: ESOPHAGOGASTRODUODENOSCOPY (EGD) WITH PROPOFOL;  Surgeon: Servando Snare,  Latara Micheli, MD;  Location: [facility];  Service: Endoscopy;  Laterality: N/A;   HERNIA REPAIR     IR NEPHROSTOMY  PLACEMENT LEFT  05/22/2018   LITHOTRIPSY     NEPHROLITHOTOMY Left 05/22/2018   Procedure: NEPHROLITHOTOMY PERCUTANEOUS;  Surgeon: Vanna Scotland, MD;  Location: [facility];  Service: Urology;  Laterality: Left;   SAVORY DILATION N/A 01/31/2015   Procedure: SAVORY DILATION;  Surgeon: Scot Jun, MD;  Location: [facility] ENDOSCOPY;  Service: Endoscopy;  Laterality: N/A;   SHOULDER ARTHROSCOPY Right 01/19/2017   Procedure: ARTHROSCOPY SHOULDER DEBRIDEMENT DECOMPRESSION AND  ROTATOR CUFF REPAIR AND BICEPS TENODESIS;  Surgeon: Christena Flake, MD;  Location: [facility];  Service: Orthopedics;  Laterality: Right;   TOTAL HIP ARTHROPLASTY Right 03/18/2022   Procedure: Right posterior total hip arthroplasty;  Surgeon: Reinaldo Berber, MD;  Location: [facility];  Service: Orthopedics;  Laterality: Right;    Prior to Admission medications   Medication Sig Start Date End Date Taking? Authorizing Provider  acetaminophen (TYLENOL) 500 MG tablet Take 2 tablets (1,000 mg total) by mouth every 8 (eight) hours. 03/19/22  Yes Evon Slack, PA-C  buPROPion (WELLBUTRIN XL) 300 MG 24 hr tablet Take 300 mg by mouth daily.   Yes [provider]  Cholecalciferol (VITAMIN D3) 50 MCG (2000 UT) capsule Take 2,000 Units by mouth daily.   Yes [provider]  DULoxetine (CYMBALTA) 60 MG capsule Take 60 mg by mouth daily.   Yes [provider]  lisinopril-hydrochlorothiazide (ZESTORETIC) 20-25 MG tablet Take 0.5 tablets by mouth daily. 12/14/19 02/28/23 Yes [provider]  meclizine (ANTIVERT) 25 MG tablet Take 25 mg by mouth 3 (three) times daily as needed for dizziness.   Yes [provider]  meloxicam (MOBIC) 15 MG tablet Take 15 mg by mouth daily as needed for pain. 04/12/22  Yes [provider]  metFORMIN (GLUCOPHAGE) 850 MG tablet Take 850 mg by mouth 2 (two) times daily with a meal.   Yes [provider]  pantoprazole (PROTONIX) 40 MG tablet Take  40 mg by mouth at bedtime. 11/03/21  Yes [provider]  potassium chloride (KLOR-CON) 8 MEQ tablet 8 mEq daily. 01/01/20  Yes [provider]  propranolol ER (INDERAL LA) 60 MG 24 hr capsule Take 60 mg by mouth daily. 02/08/22 02/28/23 Yes [provider]  rosuvastatin (CRESTOR) 5 MG tablet Take 5 mg by mouth at bedtime.    Yes [provider]  traZODone (DESYREL) 50 MG tablet Take 50 mg by mouth at bedtime. 01/02/22  Yes [provider]  vitamin B-12 (CYANOCOBALAMIN) 1000 MCG tablet Take 1,000 mcg by mouth daily.   Yes [provider]    Allergies as of 02/16/2023   (No Known Allergies)    History reviewed. No pertinent family history.  Social History   Socioeconomic History   Marital status: Widowed    Spouse name: Not on file   Number of children: Not on file   Years of education: Not on file   Highest education level: Not on file  Occupational History   Not on file  Tobacco Use   Smoking status: Former    Current packs/day: 0.00    Average packs/day: 1.5 packs/day for 25.0 years (37.5 ttl pk-yrs)    Types: Cigarettes    Start date: 34    Quit date: 2010    Years since quitting: 14.9   Smokeless tobacco: Never  Vaping Use   Vaping status: Never Used  Substance  and Sexual Activity   Alcohol use: No   Drug use: No   Sexual activity: Yes    Birth control/protection: Post-menopausal  Other Topics Concern   Not on file  Social History Narrative   Not on file   Social Determinants of Health   Financial Resource Strain: Not on file  Food Insecurity: No Food Insecurity (05/25/2022)   Hunger Vital Sign    Worried About Running Out of Food in the Last Year: Never true    Ran Out of Food in the Last Year: Never true  Transportation Needs: No Transportation Needs (05/25/2022)   PRAPARE - Transportation    Lack of Transportation (Medical): No    Lack of Transportation (Non-Medical): No  Physical Activity: Not on file   Stress: Not on file  Social Connections: Not on file  Intimate Partner Violence: Not At Risk (05/25/2022)   Humiliation, Afraid, Rape, and Kick questionnaire    Fear of Current or Ex-Partner: No    Emotionally Abused: No    Physically Abused: No    Sexually Abused: No    Review of Systems: See HPI, otherwise negative ROS  Physical Exam: BP (!) 145/74   Pulse 91   Temp 97.8 F (36.6 C) (Temporal)   Resp 20   Ht 5' 2.01" (1.575 m)   Wt 78.9 kg   SpO2 96%   BMI 31.82 kg/m  General:   Alert,  pleasant and cooperative in NAD Head:  Normocephalic and atraumatic. Neck:  Supple; no masses or thyromegaly. Lungs:  Clear throughout to auscultation.    Heart:  Regular rate and rhythm. Abdomen:  Soft, nontender and nondistended. Normal bowel sounds, without guarding, and without rebound.   Neurologic:  Alert and  oriented x4;  grossly normal neurologically.  Impression/Plan: Sheryl [name] is here for an endoscopy to be performed for dysphagia  Risks, benefits, limitations, and alternatives regarding  endoscopy have been reviewed with the patient.  Questions have been answered.  All parties agreeable.   Midge Minium, MD  03/07/2023, 8:25 AM

## 2023-03-07 NOTE — Anesthesia Preprocedure Evaluation (Signed)
Anesthesia Evaluation  Patient identified by MRN, date of birth, ID band Patient awake    Reviewed: Allergy & Precautions, H&P , NPO status , Patient's Chart, lab work & pertinent test results, reviewed documented beta blocker date and time   History of Anesthesia Complications (+) PONV and history of anesthetic complications  Airway Mallampati: II   Neck ROM: full    Dental  (+) Poor Dentition   Pulmonary neg pulmonary ROS, former smoker   Pulmonary exam normal        Cardiovascular Exercise Tolerance: Good hypertension, On Medications negative cardio ROS Normal cardiovascular exam Rhythm:regular Rate:Normal     Neuro/Psych  Headaches PSYCHIATRIC DISORDERS Anxiety Depression       GI/Hepatic Neg liver ROS, hiatal hernia,GERD  Medicated,,  Endo/Other  negative endocrine ROSdiabetes    Renal/GU Renal disease  negative genitourinary   Musculoskeletal   Abdominal   Peds  Hematology negative hematology ROS (+)   Anesthesia Other Findings Past Medical History: No date: Anesthesia complication     Comment:  bradycardia No date: Anxiety No date: Arthritis     Comment:  lower back, left hip  No date: Chicken pox No date: Chronic insomnia No date: Chronic kidney disease No date: Depression No date: Diabetes mellitus without complication (HCC) No date: Esophageal dysphagia No date: GERD (gastroesophageal reflux disease) 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 No date: History of hiatal hernia No date: History of kidney stones No date: Hypercholesterolemia No date: Hypertension No date: Migraines     Comment:  rare now No date: Nephrolithiasis No date: Osteopenia No date: PONV (postoperative nausea and vomiting)     Comment:  VOMITED A LITTLE BIT AFTER KIDNEY STONE SURGERY 05/24/2022: Sepsis (HCC) No date: Vertigo No date: Vitamin D deficiency Past Surgical History: No date: BREAST CYST ASPIRATION; Left      Comment:  negative 2012 No date: CHOLECYSTECTOMY No date: COLONOSCOPY     Comment:  2007, 2012 12/25/2020: COLONOSCOPY WITH PROPOFOL; N/A     Comment:  Procedure: COLONOSCOPY WITH PROPOFOL;  Surgeon: Midge Minium, MD;  Location: [facility] ENDOSCOPY;  Service:               Endoscopy;  Laterality: N/A; No date: COLONOSCOPY, ESOPHAGOGASTRODUODENOSCOPY (EGD) AND ESOPHAGEAL  DILATION 12/04/2018: CYSTOSCOPY W/ RETROGRADES; Bilateral     Comment:  Procedure: CYSTOSCOPY WITH RETROGRADE PYELOGRAM;                Surgeon: Vanna Scotland, MD;  Location: [facility] ORS;                Service: Urology;  Laterality: Bilateral; 05/24/2022: CYSTOSCOPY WITH STENT PLACEMENT; Left     Comment:  Procedure: CYSTOSCOPY WITH STENT PLACEMENT;  Surgeon:               Vanna Scotland, MD;  Location: [facility] ORS;  Service:               Urology;  Laterality: Left; 06/26/2018: CYSTOSCOPY/URETEROSCOPY/HOLMIUM LASER/STENT PLACEMENT;  Left     Comment:  Procedure: CYSTOSCOPY/URETEROSCOPY/HOLMIUM LASER/STENT               Exchange;  Surgeon: Vanna Scotland, MD;  Location: [facility]               ORS;  Service: Urology;  Laterality: Left; 12/04/2018: CYSTOSCOPY/URETEROSCOPY/HOLMIUM LASER/STENT PLACEMENT;  Right     Comment:  Procedure: CYSTOSCOPY/URETEROSCOPY/HOLMIUM LASER/STENT  PLACEMENT;  Surgeon: Vanna Scotland, MD;  Location: [facility]              ORS;  Service: Urology;  Laterality: Right; 06/07/2022: CYSTOSCOPY/URETEROSCOPY/HOLMIUM LASER/STENT PLACEMENT; Left     Comment:  Procedure: CYSTOSCOPY/URETEROSCOPY/HOLMIUM LASER/STENT               EXCHANGE;  Surgeon: Vanna Scotland, MD;  Location: [facility]               ORS;  Service: Urology;  Laterality: Left; 01/19/2017: DISTAL BICEPS TENDON REPAIR; Right     Comment:  Procedure: BICEPS TENODESIS;  Surgeon: Christena Flake,               MD;  Location: [facility];  Service:               Orthopedics;  Laterality: Right;  Diabetic - oral meds No  date: ESOPHAGOGASTRODUODENOSCOPY     Comment:  2012, 2014, 2016, 2018 01/31/2015: ESOPHAGOGASTRODUODENOSCOPY (EGD) WITH PROPOFOL; N/A     Comment:  Procedure: ESOPHAGOGASTRODUODENOSCOPY (EGD) WITH               PROPOFOL;  Surgeon: Scot Jun, MD;  Location[facility]              ENDOSCOPY;  Service: Endoscopy;  Laterality: N/A; 06/19/2020: ESOPHAGOGASTRODUODENOSCOPY (EGD) WITH PROPOFOL; N/A     Comment:  Procedure: ESOPHAGOGASTRODUODENOSCOPY (EGD) WITH               PROPOFOL;  Surgeon: Midge Minium, MD;  Location: [facility]               ENDOSCOPY;  Service: Endoscopy;  Laterality: N/A; 12/25/2020: ESOPHAGOGASTRODUODENOSCOPY (EGD) WITH PROPOFOL; N/A     Comment:  Procedure: ESOPHAGOGASTRODUODENOSCOPY (EGD) WITH               PROPOFOL;  Surgeon: Midge Minium, MD;  Location: [facility]               ENDOSCOPY;  Service: Endoscopy;  Laterality: N/A; 12/22/2021: ESOPHAGOGASTRODUODENOSCOPY (EGD) WITH PROPOFOL; N/A     Comment:  Procedure: ESOPHAGOGASTRODUODENOSCOPY (EGD) WITH               PROPOFOL;  Surgeon: Midge Minium, MD;  Location: [facility]               ENDOSCOPY;  Service: Endoscopy;  Laterality: N/A; 01/28/2022: ESOPHAGOGASTRODUODENOSCOPY (EGD) WITH PROPOFOL; N/A     Comment:  Procedure: ESOPHAGOGASTRODUODENOSCOPY (EGD) WITH               PROPOFOL;  Surgeon: Midge Minium, MD;  Location: [facility]               ENDOSCOPY;  Service: Endoscopy;  Laterality: N/A; No date: HERNIA REPAIR 05/22/2018: IR NEPHROSTOMY PLACEMENT LEFT No date: LITHOTRIPSY 05/22/2018: NEPHROLITHOTOMY; Left     Comment:  Procedure: NEPHROLITHOTOMY PERCUTANEOUS;  Surgeon:               Vanna Scotland, MD;  Location: [facility] ORS;  Service:               Urology;  Laterality: Left; 01/31/2015: SAVORY DILATION; N/A     Comment:  Procedure: SAVORY DILATION;  Surgeon: Scot Jun,               MD;  Location: [facility] ENDOSCOPY;  Service: Endoscopy;                Laterality: N/A; 01/19/2017: SHOULDER ARTHROSCOPY; Right     Comment:  Procedure: ARTHROSCOPY SHOULDER DEBRIDEMENT               DECOMPRESSION AND  ROTATOR CUFF REPAIR AND BICEPS               TENODESIS;  Surgeon: Christena Flake, MD;  Location: [facility];  Service: Orthopedics;  Laterality: Right; 03/18/2022: TOTAL HIP ARTHROPLASTY; Right     Comment:  Procedure: Right posterior total hip arthroplasty;                Surgeon: Reinaldo Berber, MD;  Location: [facility] ORS;                Service: Orthopedics;  Laterality: Right; BMI    Body Mass Index: 31.82 kg/m     Reproductive/Obstetrics negative OB ROS                             Anesthesia Physical Anesthesia Plan  ASA: 3  Anesthesia Plan: General   Post-op Pain Management:    Induction:   PONV Risk Score and Plan: 4 or greater  Airway Management Planned:   Additional Equipment:   Intra-op Plan:   Post-operative Plan:   Informed Consent: I have reviewed the patients History and Physical, chart, labs and discussed the procedure including the risks, benefits and alternatives for the proposed anesthesia with the patient or authorized representative who has indicated his/her understanding and acceptance.     Dental Advisory Given  Plan Discussed with: CRNA  Anesthesia Plan Comments:        Anesthesia Quick Evaluation

## 2023-03-07 NOTE — Op Note (Addendum)
[facility] Patient Name: Sheryl [name] Procedure Date: 03/07/2023 8:54 AM MRN: [id number] Account #: [id number] Date of Birth: 09/10/1950 Admit Type: Outpatient Age: 72 Room: [facility] OR ROOM 01 Gender: Female Note Status: Finalized Instrument Name: [id number] Procedure:             Upper GI endoscopy Indications:           Dysphagia Providers:             Midge Minium MD, MD Referring MD:          Barnett Applebaum, MD (Referring MD) Medicines:             Propofol per Anesthesia Complications:         No immediate complications. Procedure:             Pre-Anesthesia Assessment:                        - Prior to the procedure, a History and Physical was                         performed, and patient medications and allergies were                         reviewed. The patient's tolerance of previous                         anesthesia was also reviewed. The risks and benefits                         of the procedure and the sedation options and risks                         were discussed with the patient. All questions were                         answered, and informed consent was obtained. Prior                         Anticoagulants: The patient has taken no anticoagulant                         or antiplatelet agents. ASA Grade Assessment: II - A                         patient with mild systemic disease. After reviewing                         the risks and benefits, the patient was deemed in                         satisfactory condition to undergo the procedure.                        After obtaining informed consent, the endoscope was                         passed under direct vision. Throughout the procedure,  the patient's blood pressure, pulse, and oxygen                         saturations were monitored continuously. The Endoscope                         was introduced through the mouth, and advanced to the                          second part of duodenum. The upper GI endoscopy was                         accomplished without difficulty. The patient tolerated                         the procedure well. Findings:      A medium-sized hiatal hernia was present.      One benign-appearing, intrinsic moderate stenosis was found at the       gastroesophageal junction. This stenosis measured 1 cm (inner diameter).       The stenosis was traversed. A TTS dilator was passed through the scope.       Dilation with a 12-13.5-15 mm balloon dilator was performed to 15 mm.      Localized moderate inflammation characterized by aphthous ulcerations       was found in the gastric antrum. Biopsies were taken with a cold forceps       for histology.      The examined duodenum was normal. Impression:            - Medium-sized hiatal hernia.                        - Benign-appearing esophageal stenosis. Dilated.                        - Gastritis. Biopsied.                        - Normal examined duodenum. Recommendation:        - Discharge patient to home.                        - Resume previous diet.                        - Continue present medications.                        - Await pathology results.                        - Repeat upper endoscopy in 1 month for retreatment. Procedure Code(s):     --- Professional ---                        [phone], Esophagogastroduodenoscopy, flexible,                         transoral; with transendoscopic balloon dilation of                         esophagus (  less than 30 mm diameter)                        43239, 59, Esophagogastroduodenoscopy, flexible,                         transoral; with biopsy, single or multiple Diagnosis Code(s):     --- Professional ---                        R13.10, Dysphagia, unspecified                        K22.2, Esophageal obstruction                        K29.70, Gastritis, unspecified, without bleeding CPT copyright 2022 American Medical  Association. All rights reserved. The codes documented in this report are preliminary and upon coder review may  be revised to meet current compliance requirements. Midge Minium MD, MD 03/07/2023 9:08:51 AM This report has been signed electronically. Number of Addenda: 0 Note Initiated On: 03/07/2023 8:54 AM Total Procedure Duration: 0 hours 4 minutes 54 seconds  Estimated Blood Loss:  Estimated blood loss: none.      [facility]

## 2023-03-08 ENCOUNTER — Encounter: Payer: Self-pay | Admitting: Gastroenterology

## 2023-03-08 DIAGNOSIS — M8589 Other specified disorders of bone density and structure, multiple sites: Secondary | ICD-10-CM | POA: Diagnosis not present

## 2023-03-08 DIAGNOSIS — E213 Hyperparathyroidism, unspecified: Secondary | ICD-10-CM | POA: Diagnosis not present

## 2023-03-08 DIAGNOSIS — Z96641 Presence of right artificial hip joint: Secondary | ICD-10-CM | POA: Diagnosis not present

## 2023-03-08 LAB — SURGICAL PATHOLOGY

## 2023-03-09 ENCOUNTER — Other Ambulatory Visit: Payer: Self-pay

## 2023-03-09 DIAGNOSIS — K222 Esophageal obstruction: Secondary | ICD-10-CM

## 2023-03-14 ENCOUNTER — Encounter: Payer: Self-pay | Admitting: Gastroenterology

## 2023-03-16 DIAGNOSIS — M810 Age-related osteoporosis without current pathological fracture: Secondary | ICD-10-CM | POA: Diagnosis not present

## 2023-03-21 ENCOUNTER — Encounter: Payer: Self-pay | Admitting: Gastroenterology

## 2023-03-22 NOTE — Anesthesia Preprocedure Evaluation (Addendum)
Anesthesia Evaluation    History of Anesthesia Complications (+) PONV and history of anesthetic complications  Airway Mallampati: II  TM Distance: >3 FB Neck ROM: Full    Dental no notable dental hx. (+) Missing, Poor Dentition Denies loose teeth, multiple missing teeth:   Pulmonary former smoker   Pulmonary exam normal breath sounds clear to auscultation       Cardiovascular hypertension, Normal cardiovascular exam Rhythm:Regular Rate:Normal     Neuro/Psych  Headaches PSYCHIATRIC DISORDERS Anxiety Depression       GI/Hepatic hiatal hernia,GERD  ,,  Endo/Other  diabetes    Renal/GU Renal disease     Musculoskeletal  (+) Arthritis ,    Abdominal   Peds  Hematology   Anesthesia Other Findings Had EGD 03-07-23 Dr. Pernell Dupre anesthesiologist  Chronic kidney disease Hypercholesterolemia Hypertension Nephrolithiasis Diabetes mellitus without complication  GERD (gastroesophageal reflux disease) History of hiatal hernia Migraines Arthritis History of kidney stones Anxiety PONV (postoperative nausea and vomiting)  Anesthesia complication "bradydcardia" Depression  Hemochromatosis carrier Chronic insomnia  Vitamin D deficiency Osteopenia  Esophageal dysphagia Sepsis  Vertigo    Reproductive/Obstetrics                             Anesthesia Physical Anesthesia Plan  ASA: 3  Anesthesia Plan: General   Post-op Pain Management:    Induction: Intravenous  PONV Risk Score and Plan:   Airway Management Planned: Natural Airway and Nasal Cannula  Additional Equipment:   Intra-op Plan:   Post-operative Plan:   Informed Consent: I have reviewed the patients History and Physical, chart, labs and discussed the procedure including the risks, benefits and alternatives for the proposed anesthesia with the patient or authorized representative who has indicated his/her understanding and  acceptance.     Dental Advisory Given  Plan Discussed with: Anesthesiologist, CRNA and Surgeon  Anesthesia Plan Comments: (Patient consented for risks of anesthesia including but not limited to:  - adverse reactions to medications - risk of airway placement if required - damage to eyes, teeth, lips or other oral mucosa - nerve damage due to positioning  - sore throat or hoarseness - Damage to heart, brain, nerves, lungs, other parts of body or loss of life  Patient voiced understanding and assent.)       Anesthesia Quick Evaluation

## 2023-04-04 ENCOUNTER — Encounter
Admission: RE | Disposition: A | Discharge status: Home / Self Care | Payer: Self-pay | Source: Home / Self Care | Attending: Gastroenterology

## 2023-04-04 ENCOUNTER — Ambulatory Visit
Admission: RE | Admit: 2023-04-04 | Discharge: 2023-04-04 | Disposition: A | Discharge status: Home / Self Care | Payer: Medicare PPO | Attending: Gastroenterology | Admitting: Gastroenterology

## 2023-04-04 ENCOUNTER — Ambulatory Visit: Payer: Medicare PPO | Admitting: Anesthesiology

## 2023-04-04 ENCOUNTER — Other Ambulatory Visit: Payer: Self-pay

## 2023-04-04 ENCOUNTER — Encounter: Payer: Self-pay | Admitting: Gastroenterology

## 2023-04-04 DIAGNOSIS — F419 Anxiety disorder, unspecified: Secondary | ICD-10-CM | POA: Insufficient documentation

## 2023-04-04 DIAGNOSIS — Z87891 Personal history of nicotine dependence: Secondary | ICD-10-CM | POA: Diagnosis not present

## 2023-04-04 DIAGNOSIS — I129 Hypertensive chronic kidney disease with stage 1 through stage 4 chronic kidney disease, or unspecified chronic kidney disease: Secondary | ICD-10-CM | POA: Diagnosis not present

## 2023-04-04 DIAGNOSIS — K449 Diaphragmatic hernia without obstruction or gangrene: Secondary | ICD-10-CM | POA: Diagnosis not present

## 2023-04-04 DIAGNOSIS — F32A Depression, unspecified: Secondary | ICD-10-CM | POA: Diagnosis not present

## 2023-04-04 DIAGNOSIS — E1122 Type 2 diabetes mellitus with diabetic chronic kidney disease: Secondary | ICD-10-CM | POA: Insufficient documentation

## 2023-04-04 DIAGNOSIS — Z7984 Long term (current) use of oral hypoglycemic drugs: Secondary | ICD-10-CM | POA: Diagnosis not present

## 2023-04-04 DIAGNOSIS — N189 Chronic kidney disease, unspecified: Secondary | ICD-10-CM | POA: Insufficient documentation

## 2023-04-04 DIAGNOSIS — I10 Essential (primary) hypertension: Secondary | ICD-10-CM | POA: Diagnosis not present

## 2023-04-04 DIAGNOSIS — K219 Gastro-esophageal reflux disease without esophagitis: Secondary | ICD-10-CM | POA: Diagnosis not present

## 2023-04-04 DIAGNOSIS — K222 Esophageal obstruction: Secondary | ICD-10-CM | POA: Diagnosis not present

## 2023-04-04 DIAGNOSIS — R1310 Dysphagia, unspecified: Secondary | ICD-10-CM | POA: Diagnosis present

## 2023-04-04 HISTORY — PX: ESOPHAGEAL DILATION: SHX303

## 2023-04-04 HISTORY — PX: ESOPHAGOGASTRODUODENOSCOPY (EGD) WITH PROPOFOL: SHX5813

## 2023-04-04 LAB — GLUCOSE, CAPILLARY: Glucose-Capillary: 121 mg/dL — ABNORMAL HIGH (ref 70–99)

## 2023-04-04 SURGERY — ESOPHAGOGASTRODUODENOSCOPY (EGD) WITH PROPOFOL
Anesthesia: General | Site: Mouth

## 2023-04-04 MED ORDER — STERILE WATER FOR IRRIGATION IR SOLN
Status: DC | PRN
  Administered 2023-04-04: 50 mL

## 2023-04-04 MED ORDER — LIDOCAINE HCL (PF) 2 % IJ SOLN
INTRAMUSCULAR | Status: AC
  Filled 2023-04-04: qty 5

## 2023-04-04 MED ORDER — PANTOPRAZOLE SODIUM 40 MG PO TBEC: 40.0000 mg | DELAYED_RELEASE_TABLET | Freq: Every day | ORAL | 11 refills | Status: DC

## 2023-04-04 MED ORDER — SODIUM CHLORIDE 0.9 % IV SOLN: INTRAVENOUS | Status: DC

## 2023-04-04 MED ORDER — LIDOCAINE HCL (CARDIAC) PF 100 MG/5ML IV SOSY
PREFILLED_SYRINGE | INTRAVENOUS | Status: DC | PRN
  Administered 2023-04-04: 50 mg via INTRAVENOUS

## 2023-04-04 MED ORDER — PROPOFOL 10 MG/ML IV BOLUS
INTRAVENOUS | Status: DC | PRN
  Administered 2023-04-04: 80 mg via INTRAVENOUS
  Administered 2023-04-04: 30 mg via INTRAVENOUS
  Administered 2023-04-04: 50 mg via INTRAVENOUS

## 2023-04-04 MED ORDER — PROPOFOL 10 MG/ML IV BOLUS
INTRAVENOUS | Status: AC
  Filled 2023-04-04: qty 20

## 2023-04-04 SURGICAL SUPPLY — 8 items
BALLN DILATOR 12-15 8 (BALLOONS) ×2
BALLOON DILATOR 12-15 8 (BALLOONS) IMPLANT
BLOCK BITE 60FR ADLT L/F GRN (MISCELLANEOUS) ×2 IMPLANT
GOWN CVR UNV OPN BCK APRN NK (MISCELLANEOUS) ×4 IMPLANT
KIT PRC NS LF DISP ENDO (KITS) ×2 IMPLANT
MANIFOLD NEPTUNE II (INSTRUMENTS) ×2 IMPLANT
SYR INFLATION 60ML (SYRINGE) IMPLANT
WATER STERILE IRR 250ML POUR (IV SOLUTION) ×2 IMPLANT

## 2023-04-04 NOTE — Transfer of Care (Signed)
Immediate Anesthesia Transfer of Care Note  Patient: Sheryl [name]  Procedure(s) Performed: ESOPHAGOGASTRODUODENOSCOPY (EGD) WITH BIOPSY (Mouth) ESOPHAGEAL DILATION (Esophagus)  Patient Location: PACU  Anesthesia Type: General  Level of Consciousness: awake, alert  and patient cooperative  Airway and Oxygen Therapy: Patient Spontanous Breathing and Patient connected to supplemental oxygen  Post-op Assessment: Post-op Vital signs reviewed, Patient's Cardiovascular Status Stable, Respiratory Function Stable, Patent Airway and No signs of Nausea or vomiting  Post-op Vital Signs: Reviewed and stable  Complications: No notable events documented.

## 2023-04-04 NOTE — H&P (Signed)
Midge Minium, MD [facility] [address], [address] [place], [place] [address] Phone:[phone] Fax : [phone]  Primary Care Physician:  Luciana Axe, NP Primary Gastroenterologist:  Dr. Servando Snare  Pre-Procedure History & Physical: HPI:  Sheryl [name] is a 72 y.o. female is here for an endoscopy.   Past Medical History:  Diagnosis Date   Anesthesia complication    bradycardia   Anxiety    Arthritis    lower back, left hip    Chicken pox    Chronic insomnia    Chronic kidney disease    Depression    Diabetes mellitus without complication (HCC)    Esophageal dysphagia    GERD (gastroesophageal reflux disease)    Hemochromatosis carrier    History of hiatal hernia    History of kidney stones    Hypercholesterolemia    Hypertension    Migraines    rare now   Nephrolithiasis    Osteopenia    PONV (postoperative nausea and vomiting)    VOMITED A LITTLE BIT AFTER KIDNEY STONE SURGERY   Sepsis (HCC) 05/24/2022   Vertigo    Vitamin D deficiency     Past Surgical History:  Procedure Laterality Date   BREAST CYST ASPIRATION Left    negative 2012   CHOLECYSTECTOMY     COLONOSCOPY     2007, 2012   COLONOSCOPY WITH PROPOFOL N/A 12/25/2020   Procedure: COLONOSCOPY WITH PROPOFOL;  Surgeon: Midge Minium, MD;  Location: [facility] ENDOSCOPY;  Service: Endoscopy;  Laterality: N/A;   COLONOSCOPY, ESOPHAGOGASTRODUODENOSCOPY (EGD) AND ESOPHAGEAL DILATION     CYSTOSCOPY W/ RETROGRADES Bilateral 12/04/2018   Procedure: CYSTOSCOPY WITH RETROGRADE PYELOGRAM;  Surgeon: Vanna Scotland, MD;  Location: [facility] ORS;  Service: Urology;  Laterality: Bilateral;   CYSTOSCOPY WITH STENT PLACEMENT Left 05/24/2022   Procedure: CYSTOSCOPY WITH STENT PLACEMENT;  Surgeon: Vanna Scotland, MD;  Location: [facility] ORS;  Service: Urology;  Laterality: Left;   CYSTOSCOPY/URETEROSCOPY/HOLMIUM LASER/STENT PLACEMENT Left 06/26/2018   Procedure: CYSTOSCOPY/URETEROSCOPY/HOLMIUM LASER/STENT Exchange;  Surgeon: Vanna Scotland, MD;  Location: [facility] ORS;  Service: Urology;  Laterality: Left;   CYSTOSCOPY/URETEROSCOPY/HOLMIUM LASER/STENT PLACEMENT Right 12/04/2018   Procedure: CYSTOSCOPY/URETEROSCOPY/HOLMIUM LASER/STENT PLACEMENT;  Surgeon: Vanna Scotland, MD;  Location: [facility] ORS;  Service: Urology;  Laterality: Right;   CYSTOSCOPY/URETEROSCOPY/HOLMIUM LASER/STENT PLACEMENT Left 06/07/2022   Procedure: CYSTOSCOPY/URETEROSCOPY/HOLMIUM LASER/STENT EXCHANGE;  Surgeon: Vanna Scotland, MD;  Location: [facility] ORS;  Service: Urology;  Laterality: Left;   DISTAL BICEPS TENDON REPAIR Right 01/19/2017   Procedure: BICEPS TENODESIS;  Surgeon: Christena Flake, MD;  Location: [facility];  Service: Orthopedics;  Laterality: Right;  Diabetic - oral meds   ESOPHAGEAL DILATION  03/07/2023   Procedure: ESOPHAGEAL DILATION;  Surgeon: Midge Minium, MD;  Location: [facility];  Service: Endoscopy;;  12-15 mm   ESOPHAGOGASTRODUODENOSCOPY     2012, 2014, 2016, 2018   ESOPHAGOGASTRODUODENOSCOPY (EGD) WITH PROPOFOL N/A 01/31/2015   Procedure: ESOPHAGOGASTRODUODENOSCOPY (EGD) WITH PROPOFOL;  Surgeon: Scot Jun, MD;  Location: [facility] ENDOSCOPY;  Service: Endoscopy;  Laterality: N/A;   ESOPHAGOGASTRODUODENOSCOPY (EGD) WITH PROPOFOL N/A 06/19/2020   Procedure: ESOPHAGOGASTRODUODENOSCOPY (EGD) WITH PROPOFOL;  Surgeon: Midge Minium, MD;  Location: [facility] ENDOSCOPY;  Service: Endoscopy;  Laterality: N/A;   ESOPHAGOGASTRODUODENOSCOPY (EGD) WITH PROPOFOL N/A 12/25/2020   Procedure: ESOPHAGOGASTRODUODENOSCOPY (EGD) WITH PROPOFOL;  Surgeon: Midge Minium, MD;  Location: [facility] ENDOSCOPY;  Service: Endoscopy;  Laterality: N/A;   ESOPHAGOGASTRODUODENOSCOPY (EGD) WITH PROPOFOL N/A 12/22/2021   Procedure: ESOPHAGOGASTRODUODENOSCOPY (EGD) WITH PROPOFOL;  Surgeon: Midge Minium, MD;  Location: [facility] ENDOSCOPY;  Service: Endoscopy;  Laterality: N/A;   ESOPHAGOGASTRODUODENOSCOPY (EGD) WITH PROPOFOL N/A 01/28/2022   Procedure:  ESOPHAGOGASTRODUODENOSCOPY (EGD) WITH PROPOFOL;  Surgeon: Midge Minium, MD;  Location: [facility] ENDOSCOPY;  Service: Endoscopy;  Laterality: N/A;   ESOPHAGOGASTRODUODENOSCOPY (EGD) WITH PROPOFOL N/A 03/07/2023   Procedure: ESOPHAGOGASTRODUODENOSCOPY (EGD) WITH PROPOFOL;  Surgeon: Midge Minium, MD;  Location: [facility];  Service: Endoscopy;  Laterality: N/A;   HERNIA REPAIR     IR NEPHROSTOMY PLACEMENT LEFT  05/22/2018   LITHOTRIPSY     NEPHROLITHOTOMY Left 05/22/2018   Procedure: NEPHROLITHOTOMY PERCUTANEOUS;  Surgeon: Vanna Scotland, MD;  Location: [facility] ORS;  Service: Urology;  Laterality: Left;   SAVORY DILATION N/A 01/31/2015   Procedure: SAVORY DILATION;  Surgeon: Scot Jun, MD;  Location: [facility] ENDOSCOPY;  Service: Endoscopy;  Laterality: N/A;   SHOULDER ARTHROSCOPY Right 01/19/2017   Procedure: ARTHROSCOPY SHOULDER DEBRIDEMENT DECOMPRESSION AND  ROTATOR CUFF REPAIR AND BICEPS TENODESIS;  Surgeon: Christena Flake, MD;  Location: [facility];  Service: Orthopedics;  Laterality: Right;   TOTAL HIP ARTHROPLASTY Right 03/18/2022   Procedure: Right posterior total hip arthroplasty;  Surgeon: Reinaldo Berber, MD;  Location: [facility];  Service: Orthopedics;  Laterality: Right;    Prior to Admission medications   Medication Sig Start Date End Date Taking? Authorizing Provider  acetaminophen (TYLENOL) 500 MG tablet Take 2 tablets (1,000 mg total) by mouth every 8 (eight) hours. 03/19/22  Yes Evon Slack, PA-C  buPROPion (WELLBUTRIN XL) 300 MG 24 hr tablet Take 300 mg by mouth daily.   Yes [provider]  Cholecalciferol (VITAMIN D3) 50 MCG (2000 UT) capsule Take 2,000 Units by mouth daily.   Yes [provider]  DULoxetine (CYMBALTA) 60 MG capsule Take 60 mg by mouth daily.   Yes [provider]  lisinopril-hydrochlorothiazide (ZESTORETIC) 20-25 MG tablet Take 0.5 tablets by mouth daily. 12/14/19 04/04/23 Yes [provider]  meloxicam  (MOBIC) 15 MG tablet Take 15 mg by mouth daily as needed for pain. 04/12/22  Yes [provider]  metFORMIN (GLUCOPHAGE) 850 MG tablet Take 850 mg by mouth 2 (two) times daily with a meal.   Yes [provider]  pantoprazole (PROTONIX) 40 MG tablet Take 40 mg by mouth at bedtime. 11/03/21  Yes [provider]  potassium chloride (KLOR-CON) 8 MEQ tablet 8 mEq daily. 01/01/20  Yes [provider]  rosuvastatin (CRESTOR) 5 MG tablet Take 5 mg by mouth at bedtime.    Yes [provider]  traZODone (DESYREL) 50 MG tablet Take 50 mg by mouth at bedtime. 01/02/22  Yes [provider]  vitamin B-12 (CYANOCOBALAMIN) 1000 MCG tablet Take 1,000 mcg by mouth daily.   Yes [provider]  meclizine (ANTIVERT) 25 MG tablet Take 25 mg by mouth 3 (three) times daily as needed for dizziness. Patient not taking: Reported on 03/21/2023    [provider]  propranolol ER (INDERAL LA) 60 MG 24 hr capsule Take 60 mg by mouth daily. 02/08/22 02/28/23  [provider]    Allergies as of 03/09/2023   (No Known Allergies)    History reviewed. No pertinent family history.  Social History   Socioeconomic History   Marital status: Widowed    Spouse name: Not on file   Number of children: Not on file   Years of education: Not on file   Highest education level: Not on file  Occupational History   Not on file  Tobacco Use  Smoking status: Former    Current packs/day: 0.00    Average packs/day: 1.5 packs/day for 25.0 years (37.5 ttl pk-yrs)    Types: Cigarettes    Start date: 65    Quit date: 2010    Years since quitting: 15.0   Smokeless tobacco: Never  Vaping Use   Vaping status: Never Used  Substance and Sexual Activity   Alcohol use: No   Drug use: No   Sexual activity: Yes    Birth control/protection: Post-menopausal  Other Topics Concern   Not on file  Social History Narrative   Not on file   Social Drivers of Health    Financial Resource Strain: Not on file  Food Insecurity: No Food Insecurity (05/25/2022)   Hunger Vital Sign    Worried About Running Out of Food in the Last Year: Never true    Ran Out of Food in the Last Year: Never true  Transportation Needs: No Transportation Needs (05/25/2022)   PRAPARE - Administrator, Civil Service (Medical): No    Lack of Transportation (Non-Medical): No  Physical Activity: Not on file  Stress: Not on file  Social Connections: Not on file  Intimate Partner Violence: Not At Risk (05/25/2022)   Humiliation, Afraid, Rape, and Kick questionnaire    Fear of Current or Ex-Partner: No    Emotionally Abused: No    Physically Abused: No    Sexually Abused: No    Review of Systems: See HPI, otherwise negative ROS  Physical Exam: BP (!) 129/58   Pulse 72   Temp (!) 97.2 F (36.2 C) (Temporal)   Resp 20   Ht 5\' 2"  (1.575 m)   Wt 81.2 kg   SpO2 97%   BMI 32.74 kg/m  General:   Alert,  pleasant and cooperative in NAD Head:  Normocephalic and atraumatic. Neck:  Supple; no masses or thyromegaly. Lungs:  Clear throughout to auscultation.    Heart:  Regular rate and rhythm. Abdomen:  Soft, nontender and nondistended. Normal bowel sounds, without guarding, and without rebound.   Neurologic:  Alert and  oriented x4;  grossly normal neurologically.  Impression/Plan: Sheryl [name] is here for an endoscopy to be performed for dysphagia  Risks, benefits, limitations, and alternatives regarding  endoscopy have been reviewed with the patient.  Questions have been answered.  All parties agreeable.   Midge Minium, MD  04/04/2023, 8:24 AM

## 2023-04-04 NOTE — Op Note (Signed)
[facility] Gastroenterology Patient Name: Sheryl [name] Procedure Date: 04/04/2023 8:24 AM MRN: [id number] Account #: [id number] Date of Birth: January 05, 1951 Admit Type: Outpatient Age: 72 Room: [facility] OR ROOM 01 Gender: Female Note Status: Finalized Instrument Name: [id number] Procedure:             Upper GI endoscopy Indications:           Dysphagia Providers:             Midge Minium MD, MD Medicines:             Propofol per Anesthesia Complications:         No immediate complications. Procedure:             Pre-Anesthesia Assessment:                        - Prior to the procedure, a History and Physical was                         performed, and patient medications and allergies were                         reviewed. The patient's tolerance of previous                         anesthesia was also reviewed. The risks and benefits                         of the procedure and the sedation options and risks                         were discussed with the patient. All questions were                         answered, and informed consent was obtained. Prior                         Anticoagulants: The patient has taken no anticoagulant                         or antiplatelet agents. ASA Grade Assessment: II - A                         patient with mild systemic disease. After reviewing                         the risks and benefits, the patient was deemed in                         satisfactory condition to undergo the procedure.                        After obtaining informed consent, the endoscope was                         passed under direct vision. Throughout the procedure,                         the patient's blood pressure, pulse, and  oxygen                         saturations were monitored continuously. The Endoscope                         was introduced through the mouth, and advanced to the                         second part of duodenum. The upper GI  endoscopy was                         accomplished without difficulty. The patient tolerated                         the procedure well. Findings:      One benign-appearing, intrinsic moderate stenosis was found at the       gastroesophageal junction. The stenosis was traversed. A TTS dilator was       passed through the scope. Dilation with a 12-13.5-15 mm balloon dilator       was performed to 15 mm. The dilation site was examined following       endoscope reinsertion and showed moderate improvement in luminal       narrowing.      A small hiatal hernia was present.      The stomach was normal.      The examined duodenum was normal. Impression:            - Benign-appearing esophageal stenosis. Dilated.                        - Small hiatal hernia.                        - Normal stomach.                        - Normal examined duodenum.                        - No specimens collected. Recommendation:        - Discharge patient to home.                        - Resume previous diet.                        - Continue present medications.                        - Use Protonix (pantoprazole) 40 mg PO daily. Procedure Code(s):     --- Professional ---                        [phone], Esophagogastroduodenoscopy, flexible,                         transoral; with transendoscopic balloon dilation of                         esophagus (less than 30 mm diameter) Diagnosis Code(s):     --- Professional ---  R13.10, Dysphagia, unspecified                        K22.2, Esophageal obstruction CPT copyright 2022 [employer] Association. All rights reserved. The codes documented in this report are preliminary and upon coder review may  be revised to meet current compliance requirements. Midge Minium MD, MD 04/04/2023 8:40:45 AM This report has been signed electronically. Number of Addenda: 0 Note Initiated On: 04/04/2023 8:24 AM Total Procedure Duration: 0 hours 3  minutes 48 seconds  Estimated Blood Loss:  Estimated blood loss: none.      [facility]

## 2023-04-04 NOTE — Anesthesia Postprocedure Evaluation (Signed)
Anesthesia Post Note  Patient: Sheryl [name]  Procedure(s) Performed: ESOPHAGOGASTRODUODENOSCOPY (EGD) WITH BIOPSY (Mouth) ESOPHAGEAL DILATION (Esophagus)  Patient location during evaluation: PACU Anesthesia Type: General Level of consciousness: awake and alert Pain management: pain level controlled Vital Signs Assessment: post-procedure vital signs reviewed and stable Respiratory status: spontaneous breathing, nonlabored ventilation, respiratory function stable and patient connected to nasal cannula oxygen Cardiovascular status: blood pressure returned to baseline and stable Postop Assessment: no apparent nausea or vomiting Anesthetic complications: no   No notable events documented.   Last Vitals:  Vitals:   04/04/23 0840 04/04/23 0850  BP: (!) 116/56 (!) 109/59  Pulse: 78 74  Resp: 20 16  Temp: (!) 36.4 C 36.7 C  SpO2: 96% 96%    Last Pain:  Vitals:   04/04/23 0850  TempSrc:   PainSc: 0-No pain                 Nickcole Bralley C Maudell Stanbrough

## 2023-04-05 ENCOUNTER — Encounter: Payer: Self-pay | Admitting: Gastroenterology

## 2023-04-14 DIAGNOSIS — R002 Palpitations: Secondary | ICD-10-CM | POA: Diagnosis not present

## 2023-04-14 DIAGNOSIS — I10 Essential (primary) hypertension: Secondary | ICD-10-CM | POA: Diagnosis not present

## 2023-04-14 DIAGNOSIS — F4321 Adjustment disorder with depressed mood: Secondary | ICD-10-CM | POA: Diagnosis not present

## 2023-04-14 DIAGNOSIS — R600 Localized edema: Secondary | ICD-10-CM | POA: Diagnosis not present

## 2023-04-14 DIAGNOSIS — E119 Type 2 diabetes mellitus without complications: Secondary | ICD-10-CM | POA: Diagnosis not present

## 2023-04-19 DIAGNOSIS — I10 Essential (primary) hypertension: Secondary | ICD-10-CM | POA: Diagnosis not present

## 2023-04-19 DIAGNOSIS — R600 Localized edema: Secondary | ICD-10-CM | POA: Diagnosis not present

## 2023-04-19 DIAGNOSIS — R002 Palpitations: Secondary | ICD-10-CM | POA: Diagnosis not present

## 2023-04-19 DIAGNOSIS — E7800 Pure hypercholesterolemia, unspecified: Secondary | ICD-10-CM | POA: Diagnosis not present

## 2023-04-28 DIAGNOSIS — R008 Other abnormalities of heart beat: Secondary | ICD-10-CM | POA: Diagnosis not present

## 2023-05-31 DIAGNOSIS — I10 Essential (primary) hypertension: Secondary | ICD-10-CM | POA: Diagnosis not present

## 2023-05-31 DIAGNOSIS — R600 Localized edema: Secondary | ICD-10-CM | POA: Diagnosis not present

## 2023-05-31 DIAGNOSIS — E7800 Pure hypercholesterolemia, unspecified: Secondary | ICD-10-CM | POA: Diagnosis not present

## 2023-07-10 DIAGNOSIS — R600 Localized edema: Secondary | ICD-10-CM | POA: Diagnosis not present

## 2023-07-10 DIAGNOSIS — N1832 Chronic kidney disease, stage 3b: Secondary | ICD-10-CM | POA: Diagnosis not present

## 2023-07-17 DIAGNOSIS — H6123 Impacted cerumen, bilateral: Secondary | ICD-10-CM | POA: Diagnosis not present

## 2023-07-17 DIAGNOSIS — J029 Acute pharyngitis, unspecified: Secondary | ICD-10-CM | POA: Diagnosis not present

## 2023-07-21 DIAGNOSIS — J069 Acute upper respiratory infection, unspecified: Secondary | ICD-10-CM | POA: Diagnosis not present

## 2023-07-21 DIAGNOSIS — H6123 Impacted cerumen, bilateral: Secondary | ICD-10-CM | POA: Diagnosis not present

## 2023-08-02 ENCOUNTER — Telehealth: Payer: Self-pay

## 2023-08-02 NOTE — Telephone Encounter (Signed)
 Patient left message on triage line stating that she has been having lower left abdominal and flank pain and is not sure if it is another kidney stone or something else. I called patient back and left a message for her to call us  back to schedule an appointment for her symptoms.

## 2023-08-05 ENCOUNTER — Ambulatory Visit: Admitting: Physician Assistant

## 2023-08-05 ENCOUNTER — Ambulatory Visit
Admission: RE | Admit: 2023-08-05 | Discharge: 2023-08-05 | Disposition: A | Discharge status: Home / Self Care | Source: Ambulatory Visit | Attending: Urology | Admitting: Urology

## 2023-08-05 ENCOUNTER — Ambulatory Visit
Admission: RE | Admit: 2023-08-05 | Discharge: 2023-08-05 | Disposition: A | Discharge status: Home / Self Care | Attending: Urology | Admitting: Urology

## 2023-08-05 VITALS — BP 129/76 | HR 101 | Ht 62.0 in | Wt 174.0 lb

## 2023-08-05 DIAGNOSIS — R109 Unspecified abdominal pain: Secondary | ICD-10-CM | POA: Diagnosis not present

## 2023-08-05 DIAGNOSIS — N200 Calculus of kidney: Secondary | ICD-10-CM | POA: Diagnosis not present

## 2023-08-05 DIAGNOSIS — Z87442 Personal history of urinary calculi: Secondary | ICD-10-CM | POA: Diagnosis not present

## 2023-08-05 LAB — URINALYSIS, COMPLETE
Bilirubin, UA: NEGATIVE
Glucose, UA: NEGATIVE
Ketones, UA: NEGATIVE
Leukocytes,UA: NEGATIVE
Nitrite, UA: NEGATIVE
Protein,UA: NEGATIVE
RBC, UA: NEGATIVE
Specific Gravity, UA: 1.01 (ref 1.005–1.030)
Urobilinogen, Ur: 0.2 mg/dL (ref 0.2–1.0)
pH, UA: 6 (ref 5.0–7.5)

## 2023-08-05 LAB — MICROSCOPIC EXAMINATION
Bacteria, UA: NONE SEEN
Epithelial Cells (non renal): >10 /HPF — AB (ref 0–10)
RBC, Urine: NONE SEEN /HPF (ref 0–2)
WBC, UA: NONE SEEN /HPF (ref 0–5)

## 2023-08-05 NOTE — Progress Notes (Signed)
 08/05/2023 10:02 AM   Sheryl [name] Jul 25, 1950 [id number]  CC: Chief Complaint  Patient presents with   Follow-up   Urinary Incontinence   HPI: Sheryl [name] is a 73 y.o. 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   Today she reports 1 week of left flank pain that radiates to the LLQ as well as nausea, vomiting, and urinary frequency.  She has also had upper respiratory symptoms this week.  Notably, she states she has never been able to spontaneously pass a stone.  KUB today with stable appearing left renal stones.  In-office UA today pan negative; urine microscopy with >10 epithelial cells/hpf.   PMH: Past Medical History:  Diagnosis Date   Anesthesia complication    bradycardia   Anxiety    Arthritis    lower back, left hip    Chicken pox    Chronic insomnia    Chronic kidney disease    Depression    Diabetes mellitus without complication (HCC)    Esophageal dysphagia    GERD (gastroesophageal reflux disease)    Hemochromatosis carrier    History of hiatal hernia    History of kidney stones    Hypercholesterolemia    Hypertension    Migraines    rare now   Nephrolithiasis    Osteopenia    PONV (postoperative nausea and vomiting)    VOMITED A LITTLE BIT AFTER KIDNEY STONE SURGERY   Sepsis (HCC) 05/24/2022   Vertigo    Vitamin D deficiency     Surgical History: Past Surgical History:  Procedure Laterality Date   BREAST CYST ASPIRATION Left    negative 2012   CHOLECYSTECTOMY     COLONOSCOPY     2007, 2012   COLONOSCOPY WITH PROPOFOL  N/A 12/25/2020   Procedure: COLONOSCOPY WITH PROPOFOL ;  Surgeon: Marnee Sink, MD;  Location: [facility];  Service: Endoscopy;  Laterality: N/A;   COLONOSCOPY, ESOPHAGOGASTRODUODENOSCOPY (EGD) AND ESOPHAGEAL DILATION     CYSTOSCOPY W/ RETROGRADES Bilateral 12/04/2018   Procedure: CYSTOSCOPY WITH  RETROGRADE PYELOGRAM;  Surgeon: Dustin Gimenez, MD;  Location: [facility];  Service: Urology;  Laterality: Bilateral;   CYSTOSCOPY WITH STENT PLACEMENT Left 05/24/2022   Procedure: CYSTOSCOPY WITH STENT PLACEMENT;  Surgeon: Dustin Gimenez, MD;  Location: [facility];  Service: Urology;  Laterality: Left;   CYSTOSCOPY/URETEROSCOPY/HOLMIUM LASER/STENT PLACEMENT Left 06/26/2018   Procedure: CYSTOSCOPY/URETEROSCOPY/HOLMIUM LASER/STENT Exchange;  Surgeon: Dustin Gimenez, MD;  Location: [facility];  Service: Urology;  Laterality: Left;   CYSTOSCOPY/URETEROSCOPY/HOLMIUM LASER/STENT PLACEMENT Right 12/04/2018   Procedure: CYSTOSCOPY/URETEROSCOPY/HOLMIUM LASER/STENT PLACEMENT;  Surgeon: Dustin Gimenez, MD;  Location: [facility];  Service: Urology;  Laterality: Right;   CYSTOSCOPY/URETEROSCOPY/HOLMIUM LASER/STENT PLACEMENT Left 06/07/2022   Procedure: CYSTOSCOPY/URETEROSCOPY/HOLMIUM LASER/STENT EXCHANGE;  Surgeon: Dustin Gimenez, MD;  Location: [facility];  Service: Urology;  Laterality: Left;   DISTAL BICEPS TENDON REPAIR Right 01/19/2017   Procedure: BICEPS TENODESIS;  Surgeon: Elner Hahn, MD;  Location: [facility];  Service: Orthopedics;  Laterality: Right;  Diabetic - oral meds   ESOPHAGEAL DILATION  03/07/2023   Procedure: ESOPHAGEAL DILATION;  Surgeon: Marnee Sink, MD;  Location: [facility];  Service: Endoscopy;;  12-15 mm   ESOPHAGEAL DILATION  04/04/2023   Procedure: ESOPHAGEAL DILATION;  Surgeon: Marnee Sink, MD;  Location: [facility];  Service: Endoscopy;;  12-26mm   ESOPHAGOGASTRODUODENOSCOPY     2012, 2014, 2016, 2018   ESOPHAGOGASTRODUODENOSCOPY (  EGD) WITH PROPOFOL  N/A 01/31/2015   Procedure: ESOPHAGOGASTRODUODENOSCOPY (EGD) WITH PROPOFOL ;  Surgeon: Cassie Click, MD;  Location: [facility];  Service: Endoscopy;  Laterality: N/A;   ESOPHAGOGASTRODUODENOSCOPY (EGD) WITH PROPOFOL  N/A 06/19/2020   Procedure: ESOPHAGOGASTRODUODENOSCOPY (EGD) WITH PROPOFOL ;  Surgeon:  Marnee Sink, MD;  Location: [facility];  Service: Endoscopy;  Laterality: N/A;   ESOPHAGOGASTRODUODENOSCOPY (EGD) WITH PROPOFOL  N/A 12/25/2020   Procedure: ESOPHAGOGASTRODUODENOSCOPY (EGD) WITH PROPOFOL ;  Surgeon: Marnee Sink, MD;  Location: [facility];  Service: Endoscopy;  Laterality: N/A;   ESOPHAGOGASTRODUODENOSCOPY (EGD) WITH PROPOFOL  N/A 12/22/2021   Procedure: ESOPHAGOGASTRODUODENOSCOPY (EGD) WITH PROPOFOL ;  Surgeon: Marnee Sink, MD;  Location: [facility];  Service: Endoscopy;  Laterality: N/A;   ESOPHAGOGASTRODUODENOSCOPY (EGD) WITH PROPOFOL  N/A 01/28/2022   Procedure: ESOPHAGOGASTRODUODENOSCOPY (EGD) WITH PROPOFOL ;  Surgeon: Marnee Sink, MD;  Location: [facility];  Service: Endoscopy;  Laterality: N/A;   ESOPHAGOGASTRODUODENOSCOPY (EGD) WITH PROPOFOL  N/A 03/07/2023   Procedure: ESOPHAGOGASTRODUODENOSCOPY (EGD) WITH PROPOFOL ;  Surgeon: Marnee Sink, MD;  Location: [facility];  Service: Endoscopy;  Laterality: N/A;   ESOPHAGOGASTRODUODENOSCOPY (EGD) WITH PROPOFOL  N/A 04/04/2023   Procedure: ESOPHAGOGASTRODUODENOSCOPY (EGD) WITH BIOPSY;  Surgeon: Marnee Sink, MD;  Location: [facility];  Service: Endoscopy;  Laterality: N/A;   HERNIA REPAIR     IR NEPHROSTOMY PLACEMENT LEFT  05/22/2018   LITHOTRIPSY     NEPHROLITHOTOMY Left 05/22/2018   Procedure: NEPHROLITHOTOMY PERCUTANEOUS;  Surgeon: Dustin Gimenez, MD;  Location: [facility];  Service: Urology;  Laterality: Left;   SAVORY DILATION N/A 01/31/2015   Procedure: SAVORY DILATION;  Surgeon: Cassie Click, MD;  Location: [facility] ENDOSCOPY;  Service: Endoscopy;  Laterality: N/A;   SHOULDER ARTHROSCOPY Right 01/19/2017   Procedure: ARTHROSCOPY SHOULDER DEBRIDEMENT DECOMPRESSION AND  ROTATOR CUFF REPAIR AND BICEPS TENODESIS;  Surgeon: Elner Hahn, MD;  Location: [facility];  Service: Orthopedics;  Laterality: Right;   TOTAL HIP ARTHROPLASTY Right 03/18/2022   Procedure: Right posterior total hip  arthroplasty;  Surgeon: Venus Ginsberg, MD;  Location: [facility];  Service: Orthopedics;  Laterality: Right;    Home Medications:  Allergies as of 08/05/2023   No Known Allergies      Medication List        Accurate as of Aug 05, 2023 10:02 AM. If you have any questions, ask your nurse or doctor.          acetaminophen  500 MG tablet Commonly known as: TYLENOL  Take 2 tablets (1,000 mg total) by mouth every 8 (eight) hours.   buPROPion  300 MG 24 hr tablet Commonly known as: WELLBUTRIN  XL Take 300 mg by mouth daily.   cyanocobalamin  1000 MCG tablet Commonly known as: VITAMIN B12 Take 1,000 mcg by mouth daily.   DULoxetine  60 MG capsule Commonly known as: CYMBALTA  Take 60 mg by mouth daily.   lisinopril  10 MG tablet Commonly known as: ZESTRIL  Take 1 tablet by mouth daily.   lisinopril -hydrochlorothiazide  20-25 MG tablet Commonly known as: ZESTORETIC  Take 0.5 tablets by mouth daily.   magnesium oxide 400 MG tablet Commonly known as: MAG-OX Take 400 mg by mouth.   meclizine 25 MG tablet Commonly known as: ANTIVERT Take 25 mg by mouth 3 (three) times daily as needed for dizziness.   meloxicam 15 MG tablet Commonly known as: MOBIC Take 15 mg by mouth daily as needed for pain.   metFORMIN 850 MG tablet Commonly known as: GLUCOPHAGE Take 850 mg by mouth 2 (two) times daily with a meal.   pantoprazole  40 MG tablet Commonly known  as: PROTONIX  Take 40 mg by mouth at bedtime.   pantoprazole  40 MG tablet Commonly known as: Protonix  Take 1 tablet (40 mg total) by mouth daily.   potassium chloride  8 MEQ tablet Commonly known as: KLOR-CON  8 mEq daily.   propranolol  ER 60 MG 24 hr capsule Commonly known as: INDERAL  LA Take 60 mg by mouth daily.   rosuvastatin  5 MG tablet Commonly known as: CRESTOR  Take 5 mg by mouth at bedtime.   traZODone  50 MG tablet Commonly known as: DESYREL  Take 50 mg by mouth at bedtime.   triamterene-hydrochlorothiazide  37.5-25 MG  tablet Commonly known as: MAXZIDE-25 Take 1 tablet by mouth daily.   Vitamin D3 50 MCG (2000 UT) capsule Take 2,000 Units by mouth daily.        Allergies:  No Known Allergies  Family History: No family history on file.  Social History:   reports that she quit smoking about 15 years ago. Her smoking use included cigarettes. She started smoking about 40 years ago. She has a 37.5 pack-year smoking history. She has never used smokeless tobacco. She reports that she does not drink alcohol  and does not use drugs.  Physical Exam: BP 129/76   Pulse (!) 101   Ht 5\' 2"  (1.575 m)   Wt 174 lb (78.9 kg)   BMI 31.83 kg/m   Constitutional:  Alert and oriented, no acute distress, nontoxic appearing HEENT: [place], AT Cardiovascular: No clubbing, cyanosis, or edema Respiratory: Normal respiratory effort, no increased work of breathing Skin: No rashes, bruises or suspicious lesions Neurologic: Grossly intact, no focal deficits, moving all 4 extremities Psychiatric: Normal mood and affect  Laboratory Data: Results for orders placed or performed in visit on 08/05/23  Microscopic Examination   Collection Time: 08/05/23 10:52 AM   Urine  Result Value Ref Range   WBC, UA None seen 0 - 5 /hpf   RBC, Urine None seen 0 - 2 /hpf   Epithelial Cells (non renal) >10 (A) 0 - 10 /hpf   Bacteria, UA None seen None seen/Few  Urinalysis, Complete   Collection Time: 08/05/23 10:52 AM  Result Value Ref Range   Specific Gravity, UA 1.010 1.005 - 1.030   pH, UA 6.0 5.0 - 7.5   Color, UA Yellow Yellow   Appearance Ur Clear Clear   Leukocytes,UA Negative Negative   Protein,UA Negative Negative/Trace   Glucose, UA Negative Negative   Ketones, UA Negative Negative   RBC, UA Negative Negative   Bilirubin, UA Negative Negative   Urobilinogen, Ur 0.2 0.2 - 1.0 mg/dL   Nitrite, UA Negative Negative   Microscopic Examination Comment    Microscopic Examination See below:    Pertinent Imaging: KUB,  08/05/2023: See epic  I personally reviewed the images referenced above and note no radiopaque ureteral stones.  Assessment & Plan:   1. Flank pain with history of urolithiasis (Primary) Question left ureteral stone based on history and symptoms.  UA bland, low suspicion for UTI.  No stone seen on KUB.  I recommended CT stone study for definitive evaluation and she agreed, we will call her with results.  Will send UA for culture for preop planning purposes if needed.  She declined pharmacotherapy. - Urinalysis, Complete - CT RENAL STONE STUDY; Future - CULTURE, URINE COMPREHENSIVE   Return for Will call with results.  Kathreen Pare, PA-C  [facility] [place] [address] [place], [place] [address] [phone]

## 2023-08-08 ENCOUNTER — Ambulatory Visit
Admission: RE | Admit: 2023-08-08 | Discharge: 2023-08-08 | Disposition: A | Discharge status: Home / Self Care | Source: Ambulatory Visit | Attending: Physician Assistant | Admitting: Physician Assistant

## 2023-08-08 DIAGNOSIS — N281 Cyst of kidney, acquired: Secondary | ICD-10-CM | POA: Diagnosis not present

## 2023-08-08 DIAGNOSIS — K449 Diaphragmatic hernia without obstruction or gangrene: Secondary | ICD-10-CM | POA: Diagnosis not present

## 2023-08-08 DIAGNOSIS — R109 Unspecified abdominal pain: Secondary | ICD-10-CM | POA: Insufficient documentation

## 2023-08-08 DIAGNOSIS — Z87442 Personal history of urinary calculi: Secondary | ICD-10-CM | POA: Diagnosis not present

## 2023-08-08 DIAGNOSIS — K5730 Diverticulosis of large intestine without perforation or abscess without bleeding: Secondary | ICD-10-CM | POA: Diagnosis not present

## 2023-08-08 DIAGNOSIS — N200 Calculus of kidney: Secondary | ICD-10-CM | POA: Diagnosis not present

## 2023-08-08 LAB — CULTURE, URINE COMPREHENSIVE

## 2023-08-17 DIAGNOSIS — E7800 Pure hypercholesterolemia, unspecified: Secondary | ICD-10-CM | POA: Diagnosis not present

## 2023-08-17 DIAGNOSIS — F4321 Adjustment disorder with depressed mood: Secondary | ICD-10-CM | POA: Diagnosis not present

## 2023-08-17 DIAGNOSIS — E559 Vitamin D deficiency, unspecified: Secondary | ICD-10-CM | POA: Diagnosis not present

## 2023-08-17 DIAGNOSIS — E119 Type 2 diabetes mellitus without complications: Secondary | ICD-10-CM | POA: Diagnosis not present

## 2023-08-17 DIAGNOSIS — I10 Essential (primary) hypertension: Secondary | ICD-10-CM | POA: Diagnosis not present

## 2023-08-17 DIAGNOSIS — F419 Anxiety disorder, unspecified: Secondary | ICD-10-CM | POA: Diagnosis not present

## 2023-08-17 DIAGNOSIS — Z1331 Encounter for screening for depression: Secondary | ICD-10-CM | POA: Diagnosis not present

## 2023-08-17 DIAGNOSIS — Z1231 Encounter for screening mammogram for malignant neoplasm of breast: Secondary | ICD-10-CM | POA: Diagnosis not present

## 2023-08-17 DIAGNOSIS — E538 Deficiency of other specified B group vitamins: Secondary | ICD-10-CM | POA: Diagnosis not present

## 2023-08-17 DIAGNOSIS — M8589 Other specified disorders of bone density and structure, multiple sites: Secondary | ICD-10-CM | POA: Diagnosis not present

## 2023-08-17 DIAGNOSIS — I129 Hypertensive chronic kidney disease with stage 1 through stage 4 chronic kidney disease, or unspecified chronic kidney disease: Secondary | ICD-10-CM | POA: Diagnosis not present

## 2023-08-17 DIAGNOSIS — Z09 Encounter for follow-up examination after completed treatment for conditions other than malignant neoplasm: Secondary | ICD-10-CM | POA: Diagnosis not present

## 2023-08-17 DIAGNOSIS — N1832 Chronic kidney disease, stage 3b: Secondary | ICD-10-CM | POA: Diagnosis not present

## 2023-08-18 ENCOUNTER — Other Ambulatory Visit: Payer: Self-pay | Admitting: Gerontology

## 2023-08-18 DIAGNOSIS — Z1231 Encounter for screening mammogram for malignant neoplasm of breast: Secondary | ICD-10-CM

## 2023-08-22 DIAGNOSIS — H35372 Puckering of macula, left eye: Secondary | ICD-10-CM | POA: Diagnosis not present

## 2023-08-22 DIAGNOSIS — E119 Type 2 diabetes mellitus without complications: Secondary | ICD-10-CM | POA: Diagnosis not present

## 2023-08-22 DIAGNOSIS — H2513 Age-related nuclear cataract, bilateral: Secondary | ICD-10-CM | POA: Diagnosis not present

## 2023-09-12 ENCOUNTER — Ambulatory Visit
Admission: RE | Admit: 2023-09-12 | Discharge: 2023-09-12 | Disposition: A | Discharge status: Home / Self Care | Source: Ambulatory Visit | Attending: Gerontology | Admitting: Gerontology

## 2023-09-12 DIAGNOSIS — Z1231 Encounter for screening mammogram for malignant neoplasm of breast: Secondary | ICD-10-CM | POA: Insufficient documentation

## 2023-09-23 DIAGNOSIS — R600 Localized edema: Secondary | ICD-10-CM | POA: Diagnosis not present

## 2023-09-23 DIAGNOSIS — Z1331 Encounter for screening for depression: Secondary | ICD-10-CM | POA: Diagnosis not present

## 2023-10-31 ENCOUNTER — Observation Stay
Admission: EM | Admit: 2023-10-31 | Discharge: 2023-11-01 | Disposition: A | Discharge status: Home / Self Care | Payer: Medicare (Managed Care) | Attending: Internal Medicine | Admitting: Internal Medicine

## 2023-10-31 ENCOUNTER — Ambulatory Visit
Admission: EM | Admit: 2023-10-31 | Discharge: 2023-10-31 | Disposition: A | Discharge status: Other Acute Inpatient Hospital | Payer: Medicare (Managed Care) | Source: Home / Self Care | Attending: Emergency Medicine | Admitting: Emergency Medicine

## 2023-10-31 ENCOUNTER — Other Ambulatory Visit: Payer: Self-pay

## 2023-10-31 ENCOUNTER — Encounter: Payer: Self-pay | Admitting: Emergency Medicine

## 2023-10-31 ENCOUNTER — Ambulatory Visit (INDEPENDENT_AMBULATORY_CARE_PROVIDER_SITE_OTHER): Payer: Medicare (Managed Care)

## 2023-10-31 ENCOUNTER — Ambulatory Visit: Payer: Self-pay | Admitting: Emergency Medicine

## 2023-10-31 DIAGNOSIS — N179 Acute kidney failure, unspecified: Secondary | ICD-10-CM | POA: Insufficient documentation

## 2023-10-31 DIAGNOSIS — E7800 Pure hypercholesterolemia, unspecified: Secondary | ICD-10-CM | POA: Diagnosis not present

## 2023-10-31 DIAGNOSIS — R600 Localized edema: Secondary | ICD-10-CM | POA: Diagnosis present

## 2023-10-31 DIAGNOSIS — R0602 Shortness of breath: Secondary | ICD-10-CM | POA: Insufficient documentation

## 2023-10-31 DIAGNOSIS — R7989 Other specified abnormal findings of blood chemistry: Secondary | ICD-10-CM | POA: Insufficient documentation

## 2023-10-31 DIAGNOSIS — E669 Obesity, unspecified: Secondary | ICD-10-CM | POA: Insufficient documentation

## 2023-10-31 DIAGNOSIS — D72829 Elevated white blood cell count, unspecified: Secondary | ICD-10-CM | POA: Insufficient documentation

## 2023-10-31 DIAGNOSIS — Z6834 Body mass index (BMI) 34.0-34.9, adult: Secondary | ICD-10-CM | POA: Insufficient documentation

## 2023-10-31 DIAGNOSIS — F418 Other specified anxiety disorders: Secondary | ICD-10-CM | POA: Diagnosis present

## 2023-10-31 DIAGNOSIS — Z794 Long term (current) use of insulin: Secondary | ICD-10-CM | POA: Diagnosis not present

## 2023-10-31 DIAGNOSIS — E1122 Type 2 diabetes mellitus with diabetic chronic kidney disease: Secondary | ICD-10-CM | POA: Insufficient documentation

## 2023-10-31 DIAGNOSIS — F32A Depression, unspecified: Secondary | ICD-10-CM | POA: Insufficient documentation

## 2023-10-31 DIAGNOSIS — F419 Anxiety disorder, unspecified: Secondary | ICD-10-CM | POA: Diagnosis not present

## 2023-10-31 DIAGNOSIS — I5032 Chronic diastolic (congestive) heart failure: Secondary | ICD-10-CM | POA: Diagnosis not present

## 2023-10-31 DIAGNOSIS — N1831 Chronic kidney disease, stage 3a: Secondary | ICD-10-CM | POA: Insufficient documentation

## 2023-10-31 DIAGNOSIS — E66811 Obesity, class 1: Secondary | ICD-10-CM | POA: Diagnosis present

## 2023-10-31 DIAGNOSIS — I10 Essential (primary) hypertension: Secondary | ICD-10-CM | POA: Diagnosis not present

## 2023-10-31 DIAGNOSIS — E1129 Type 2 diabetes mellitus with other diabetic kidney complication: Secondary | ICD-10-CM | POA: Diagnosis present

## 2023-10-31 DIAGNOSIS — I130 Hypertensive heart and chronic kidney disease with heart failure and stage 1 through stage 4 chronic kidney disease, or unspecified chronic kidney disease: Secondary | ICD-10-CM | POA: Diagnosis not present

## 2023-10-31 HISTORY — DX: Chronic diastolic (congestive) heart failure: I50.32

## 2023-10-31 LAB — COMPREHENSIVE METABOLIC PANEL WITH GFR
ALT: 15 U/L (ref 0–44)
AST: 17 U/L (ref 15–41)
Albumin: 3.9 g/dL (ref 3.5–5.0)
Alkaline Phosphatase: 48 U/L (ref 38–126)
Anion gap: 11 (ref 5–15)
BUN: 39 mg/dL — ABNORMAL HIGH (ref 8–23)
CO2: 25 mmol/L (ref 22–32)
Calcium: 9.3 mg/dL (ref 8.9–10.3)
Chloride: 103 mmol/L (ref 98–111)
Creatinine, Ser: 3.09 mg/dL — ABNORMAL HIGH (ref 0.44–1.00)
GFR, Estimated: 16 mL/min — ABNORMAL LOW (ref >60)
Glucose, Bld: 126 mg/dL — ABNORMAL HIGH (ref 70–99)
Potassium: 4.3 mmol/L (ref 3.5–5.1)
Sodium: 139 mmol/L (ref 135–145)
Total Bilirubin: 0.5 mg/dL (ref 0.0–1.2)
Total Protein: 7.4 g/dL (ref 6.5–8.1)

## 2023-10-31 LAB — CBC WITH DIFFERENTIAL/PLATELET
Abs Immature Granulocytes: 0.03 K/uL (ref 0.00–0.07)
Basophils Absolute: 0 K/uL (ref 0.0–0.1)
Basophils Relative: 0 %
Eosinophils Absolute: 0.9 K/uL — ABNORMAL HIGH (ref 0.0–0.5)
Eosinophils Relative: 8 %
HCT: 41.2 % (ref 36.0–46.0)
Hemoglobin: 14 g/dL (ref 12.0–15.0)
Immature Granulocytes: 0 %
Lymphocytes Relative: 18 %
Lymphs Abs: 1.9 K/uL (ref 0.7–4.0)
MCH: 30.7 pg (ref 26.0–34.0)
MCHC: 34 g/dL (ref 30.0–36.0)
MCV: 90.4 fL (ref 80.0–100.0)
Monocytes Absolute: 0.8 K/uL (ref 0.1–1.0)
Monocytes Relative: 8 %
Neutro Abs: 6.8 K/uL (ref 1.7–7.7)
Neutrophils Relative %: 66 %
Platelets: 285 K/uL (ref 150–400)
RBC: 4.56 MIL/uL (ref 3.87–5.11)
RDW: 13 % (ref 11.5–15.5)
WBC: 10.6 K/uL — ABNORMAL HIGH (ref 4.0–10.5)
nRBC: 0 % (ref 0.0–0.2)

## 2023-10-31 LAB — CBC
HCT: 41.8 % (ref 36.0–46.0)
Hemoglobin: 14.2 g/dL (ref 12.0–15.0)
MCH: 31 pg (ref 26.0–34.0)
MCHC: 34 g/dL (ref 30.0–36.0)
MCV: 91.3 fL (ref 80.0–100.0)
Platelets: 283 K/uL (ref 150–400)
RBC: 4.58 MIL/uL (ref 3.87–5.11)
RDW: 12.5 % (ref 11.5–15.5)
WBC: 11.6 K/uL — ABNORMAL HIGH (ref 4.0–10.5)
nRBC: 0 % (ref 0.0–0.2)

## 2023-10-31 LAB — URINALYSIS, DIPSTICK ONLY
Glucose, UA: NEGATIVE mg/dL
Hgb urine dipstick: NEGATIVE
Leukocytes,Ua: NEGATIVE
Nitrite: NEGATIVE
Protein, ur: 30 mg/dL — AB
Specific Gravity, Urine: >1.03 — ABNORMAL HIGH (ref 1.005–1.030)
pH: 5.5 (ref 5.0–8.0)

## 2023-10-31 LAB — BASIC METABOLIC PANEL WITH GFR
Anion gap: 15 (ref 5–15)
BUN: 39 mg/dL — ABNORMAL HIGH (ref 8–23)
CO2: 22 mmol/L (ref 22–32)
Calcium: 9.5 mg/dL (ref 8.9–10.3)
Chloride: 103 mmol/L (ref 98–111)
Creatinine, Ser: 2.76 mg/dL — ABNORMAL HIGH (ref 0.44–1.00)
GFR, Estimated: 18 mL/min — ABNORMAL LOW (ref >60)
Glucose, Bld: 100 mg/dL — ABNORMAL HIGH (ref 70–99)
Potassium: 4.2 mmol/L (ref 3.5–5.1)
Sodium: 140 mmol/L (ref 135–145)

## 2023-10-31 LAB — CBG MONITORING, ED: Glucose-Capillary: 98 mg/dL (ref 70–99)

## 2023-10-31 LAB — GLUCOSE, CAPILLARY: Glucose-Capillary: 104 mg/dL — ABNORMAL HIGH (ref 70–99)

## 2023-10-31 LAB — BRAIN NATRIURETIC PEPTIDE: B Natriuretic Peptide: 37.2 pg/mL (ref 0.0–100.0)

## 2023-10-31 MED ORDER — VITAMIN B-12 1000 MCG PO TABS
1000.0000 ug | ORAL_TABLET | Freq: Every day | ORAL | Status: DC
  Administered 2023-11-01: 1000 ug via ORAL
  Filled 2023-10-31: qty 1

## 2023-10-31 MED ORDER — INSULIN ASPART 100 UNIT/ML IJ SOLN
0.0000 [IU] | Freq: Three times a day (TID) | INTRAMUSCULAR | Status: DC
  Administered 2023-11-01: 1 [IU] via SUBCUTANEOUS
  Filled 2023-10-31: qty 1

## 2023-10-31 MED ORDER — VITAMIN D 25 MCG (1000 UNIT) PO TABS
2000.0000 [IU] | ORAL_TABLET | Freq: Every day | ORAL | Status: DC
  Administered 2023-11-01: 2000 [IU] via ORAL
  Filled 2023-10-31: qty 2

## 2023-10-31 MED ORDER — PANTOPRAZOLE SODIUM 40 MG PO TBEC
40.0000 mg | DELAYED_RELEASE_TABLET | Freq: Every day | ORAL | Status: DC
  Administered 2023-10-31: 40 mg via ORAL
  Filled 2023-10-31: qty 1

## 2023-10-31 MED ORDER — ALBUTEROL SULFATE (2.5 MG/3ML) 0.083% IN NEBU: 2.5000 mg | INHALATION_SOLUTION | RESPIRATORY_TRACT | Status: DC | PRN

## 2023-10-31 MED ORDER — HYDRALAZINE HCL 20 MG/ML IJ SOLN: 5.0000 mg | INTRAMUSCULAR | Status: DC | PRN

## 2023-10-31 MED ORDER — DM-GUAIFENESIN ER 30-600 MG PO TB12: 1.0000 | ORAL_TABLET | Freq: Two times a day (BID) | ORAL | Status: DC | PRN

## 2023-10-31 MED ORDER — ROSUVASTATIN CALCIUM 10 MG PO TABS
5.0000 mg | ORAL_TABLET | Freq: Every day | ORAL | Status: DC
  Administered 2023-10-31: 5 mg via ORAL
  Filled 2023-10-31: qty 1

## 2023-10-31 MED ORDER — BUPROPION HCL ER (XL) 150 MG PO TB24
300.0000 mg | ORAL_TABLET | Freq: Every day | ORAL | Status: DC
  Administered 2023-11-01: 300 mg via ORAL
  Filled 2023-10-31: qty 2

## 2023-10-31 MED ORDER — MAGNESIUM OXIDE -MG SUPPLEMENT 400 (240 MG) MG PO TABS
400.0000 mg | ORAL_TABLET | Freq: Every day | ORAL | Status: DC
  Administered 2023-11-01: 400 mg via ORAL
  Filled 2023-10-31: qty 1

## 2023-10-31 MED ORDER — INSULIN ASPART 100 UNIT/ML IJ SOLN
0.0000 [IU] | Freq: Every day | INTRAMUSCULAR | Status: DC
  Administered 2023-10-31: 0 [IU] via SUBCUTANEOUS

## 2023-10-31 MED ORDER — ORAL CARE MOUTH RINSE: 15.0000 mL | OROMUCOSAL | Status: DC | PRN

## 2023-10-31 MED ORDER — MECLIZINE HCL 25 MG PO TABS: 25.0000 mg | ORAL_TABLET | Freq: Three times a day (TID) | ORAL | Status: DC | PRN

## 2023-10-31 MED ORDER — TRAZODONE HCL 50 MG PO TABS
50.0000 mg | ORAL_TABLET | Freq: Every day | ORAL | Status: DC
  Administered 2023-10-31: 50 mg via ORAL
  Filled 2023-10-31: qty 1

## 2023-10-31 MED ORDER — ONDANSETRON HCL 4 MG/2ML IJ SOLN: 4.0000 mg | Freq: Three times a day (TID) | INTRAMUSCULAR | Status: DC | PRN

## 2023-10-31 MED ORDER — ACETAMINOPHEN 325 MG PO TABS: 650.0000 mg | ORAL_TABLET | Freq: Four times a day (QID) | ORAL | Status: DC | PRN

## 2023-10-31 MED ORDER — HEPARIN SODIUM (PORCINE) 5000 UNIT/ML IJ SOLN
5000.0000 [IU] | Freq: Three times a day (TID) | INTRAMUSCULAR | Status: DC
  Administered 2023-10-31 – 2023-11-01 (×3): 5000 [IU] via SUBCUTANEOUS
  Filled 2023-10-31 (×3): qty 1

## 2023-10-31 NOTE — ED Notes (Signed)
 Patient is being discharged from the Urgent Care and sent to the Emergency Department via personal vehicle . Per Dr. Van, patient is in need of higher level of care due to AKI. Patient is aware and verbalizes understanding of plan of care.  Vitals:   10/31/23 0955  BP: 98/68  Pulse: 75  Resp: 16  SpO2: 98%

## 2023-10-31 NOTE — ED Provider Notes (Signed)
 [facility] Provider Note    Event Date/Time   First MD Initiated Contact with Patient 10/31/23 1926     (approximate)   History   Leg Swelling   HPI  Sheryl [name] is a 73 y.o. female who presents from urgent care for bilateral lower extremity swelling.  She had labs in urgent care which demonstrated acute kidney injury.  Patient is on diuretics     Physical Exam   Triage Vital Signs: ED Triage Vitals [10/31/23 1715]  Encounter Vitals Group     BP (!) 116/55     Girls Systolic BP Percentile      Girls Diastolic BP Percentile      Boys Systolic BP Percentile      Boys Diastolic BP Percentile      Pulse Rate 85     Resp 18     Temp 98.2 F (36.8 C)     Temp Source Oral     SpO2 97 %     Weight 77.1 kg (170 lb)     Height 1.575 m (5' 2)     Head Circumference      Peak Flow      Pain Score 0     Pain Loc      Pain Education      Exclude from Growth Chart     Most recent vital signs: Vitals:   10/31/23 2136 10/31/23 2211  BP:  120/62  Pulse:  78  Resp:  20  Temp: 98.3 F (36.8 C) 97.9 F (36.6 C)  SpO2:  100%     General: Awake, no distress.  CV:  Good peripheral perfusion.  Resp:  Normal effort.  Abd:  No distention.  Other:  Mild edema bilaterally   ED Results / Procedures / Treatments   Labs (all labs ordered are listed, but only abnormal results are displayed) Labs Reviewed  BASIC METABOLIC PANEL WITH GFR - Abnormal; Notable for the following components:      Result Value   Glucose, Bld 100 (*)    BUN 39 (*)    Creatinine, Ser 2.76 (*)    GFR, Estimated 18 (*)    All other components within normal limits  CBC - Abnormal; Notable for the following components:   WBC 11.6 (*)    All other components within normal limits  GLUCOSE, CAPILLARY - Abnormal; Notable for the following components:   Glucose-Capillary 104 (*)    All other components within normal limits  BRAIN NATRIURETIC PEPTIDE  CBC  BASIC  METABOLIC PANEL WITH GFR  CBG MONITORING, ED     EKG  EKG from urgent care nonspecific ST changes   RADIOLOGY Chest x-ray without acute abnormality    PROCEDURES:  Critical Care performed:   Procedures   MEDICATIONS ORDERED IN ED: Medications  albuterol  (PROVENTIL ) (2.5 MG/3ML) 0.083% nebulizer solution 2.5 mg (has no administration in time range)  dextromethorphan-guaiFENesin  (MUCINEX  DM) 30-600 MG per 12 hr tablet 1 tablet (has no administration in time range)  ondansetron  (ZOFRAN ) injection 4 mg (has no administration in time range)  hydrALAZINE  (APRESOLINE ) injection 5 mg (has no administration in time range)  acetaminophen  (TYLENOL ) tablet 650 mg (has no administration in time range)  insulin  aspart (novoLOG ) injection 0-9 Units (has no administration in time range)  insulin  aspart (novoLOG ) injection 0-5 Units (0 Units Subcutaneous Given 10/31/23 2136)  heparin  injection 5,000 Units (5,000 Units Subcutaneous Given 10/31/23 2154)     IMPRESSION / MDM /  ASSESSMENT AND PLAN / ED COURSE  I reviewed the triage vital signs and the nursing notes. Patient's presentation is most consistent with acute presentation with potential threat to life or bodily function.  Patient presents with lower extremity edema, reports of elevated creatinine  Lab work repeated and confirmed, creatinine of 2.76, on review of records it appears that her creatinine is typically normal, this could be related to overdiuresis.  Have discussed with the hospitalist for admission        FINAL CLINICAL IMPRESSION(S) / ED DIAGNOSES   Final diagnoses:  Acute kidney injury (HCC)     Rx / DC Orders   ED Discharge Orders     None        Note:  This document was prepared using Dragon voice recognition software and may include unintentional dictation errors.   Arlander Charleston, MD 10/31/23 2300

## 2023-10-31 NOTE — ED Provider Triage Note (Signed)
 Emergency Medicine Provider Triage Evaluation Note  Sheryl [name] , a 73 y.o. female  was evaluated in triage.  Pt complains of lower extremities edema.  Patient was seen in Ortho clinic [place], they did CBC CMP chest x-ray.  They diagnosed acute kidney failure possible secondary to over overuse of diuretics.  Patient presents with shortness of breath, lower extremity bilateral heart edema.  Patient was referred to ED  Review of Systems  Positive:  Negative:  Physical Exam  BP (!) 116/55 (BP Location: Left Arm)   Pulse 85   Temp 98.2 F (36.8 C) (Oral)   Resp 18   Ht 5' 2 (1.575 m)   Wt 77.1 kg   SpO2 97%   BMI 31.09 kg/m in triage vital signs were normal Gen:   Awake, no distress   Resp:  Normal effort  MSK:   Moves extremities without difficulty  Other:   Lower extremities: Presence of bilateral heart edema grade 2 Medical Decision Making  Medically screening exam initiated at 5:19 PM.  Appropriate orders placed.  Sheryl [name] was informed that the remainder of the evaluation will be completed by another provider, this initial triage assessment does not replace that evaluation, and the importance of remaining in the ED until their evaluation is complete.  Patient was seen at urgent care in [facility].  for possible acute kidney injury secondary to overuse of the diuretics.  Patient endorses having shortness of breath, bilateral lower extremity edema, sometimes tightness in her chest.  We did not order checks x-ray today she had x-ray at the urgent care.  Ordered CBC and CMP to recheck renal function   Sheryl Kast, PA-C 10/31/23 1722

## 2023-10-31 NOTE — ED Provider Notes (Signed)
 HPI  SUBJECTIVE:  Sheryl [name] is a 73 y.o. female who presents with bilateral lower extremity edema has been going on for months.  She reports worsening shortness of breath, easy fatigability for the past few months that has not changed today.  She reports occasional chest tightness with sitting over the past few months.  There is no exertional component to this.  She reports difficulty walking for prolonged distances due to shortness of breath and hip soreness.  Estimates that she can go about 100 feet before she has to stop and rest.  This is also not changed in the past few weeks.  Today no blisters, sores, change in her erythema, increased temperature in her bilateral lower extremities.  No coughing, wheezing, unintentional weight gain, orthopnea, nocturia, PND, abdominal pain, fevers.  She has been put on Maxide, Lasix, and has tried elevation with improvement in her symptoms.  Symptoms are worse with walking.  She has not tried compression stockings.  She has a past medical history of diabetes, GERD, hypercholesterolemia, hypertension, sepsis from obstructing nephrolithiasis in 2024, nephrolithiasis, hypokalemia, palpitations, osteopenia.  No history of coronary artery disease, MI, PAD/PVD, CHF, chronic kidney disease, arrhythmia, COPD.  [facility].  Cardiology: [facility].  She has a follow-up appointment on 9/26, but states that she will try and get an earlier appointment  She was seen by her PCP last month on 6/20 for worsening bilateral lower extremity peripheral edema with bilateral lower extremity erythema, increased temperature, itching, weeping blisters.  She was put on 20 mg of Lasix for 3 days. She was seen at the [facility] clinic in April for lower extremity edema and was sent home with Lasix for 7 days, advised elastic hose.  She has seen cardiology for hypertension, hypercholesterolemia and bilateral lower extremity edema in January, February 2025.  She was not reporting  chest pain, shortness of breath at that time.  She had a normal echo on 04/08/2023 which showed an EF of about 55%,  Past Medical History:  Diagnosis Date   Anesthesia complication    bradycardia   Anxiety    Arthritis    lower back, left hip    Chicken pox    Chronic insomnia    Chronic kidney disease    Depression    Diabetes mellitus without complication (HCC)    Esophageal dysphagia    GERD (gastroesophageal reflux disease)    Hemochromatosis carrier    History of hiatal hernia    History of kidney stones    Hypercholesterolemia    Hypertension    Migraines    rare now   Nephrolithiasis    Osteopenia    PONV (postoperative nausea and vomiting)    VOMITED A LITTLE BIT AFTER KIDNEY STONE SURGERY   Sepsis (HCC) 05/24/2022   Vertigo    Vitamin D  deficiency     Past Surgical History:  Procedure Laterality Date   BREAST CYST ASPIRATION Left    negative 2012   CHOLECYSTECTOMY     COLONOSCOPY     2007, 2012   COLONOSCOPY WITH PROPOFOL  N/A 12/25/2020   Procedure: COLONOSCOPY WITH PROPOFOL ;  Surgeon: Jinny Carmine, MD;  Location: [facility];  Service: Endoscopy;  Laterality: N/A;   COLONOSCOPY, ESOPHAGOGASTRODUODENOSCOPY (EGD) AND ESOPHAGEAL DILATION     CYSTOSCOPY W/ RETROGRADES Bilateral 12/04/2018   Procedure: CYSTOSCOPY WITH RETROGRADE PYELOGRAM;  Surgeon: Penne Knee, MD;  Location: [facility];  Service: Urology;  Laterality: Bilateral;   CYSTOSCOPY WITH STENT PLACEMENT Left 05/24/2022  Procedure: CYSTOSCOPY WITH STENT PLACEMENT;  Surgeon: Penne Knee, MD;  Location: [facility];  Service: Urology;  Laterality: Left;   CYSTOSCOPY/URETEROSCOPY/HOLMIUM LASER/STENT PLACEMENT Left 06/26/2018   Procedure: CYSTOSCOPY/URETEROSCOPY/HOLMIUM LASER/STENT Exchange;  Surgeon: Penne Knee, MD;  Location: [facility];  Service: Urology;  Laterality: Left;   CYSTOSCOPY/URETEROSCOPY/HOLMIUM LASER/STENT PLACEMENT Right 12/04/2018   Procedure: CYSTOSCOPY/URETEROSCOPY/HOLMIUM  LASER/STENT PLACEMENT;  Surgeon: Penne Knee, MD;  Location: [facility];  Service: Urology;  Laterality: Right;   CYSTOSCOPY/URETEROSCOPY/HOLMIUM LASER/STENT PLACEMENT Left 06/07/2022   Procedure: CYSTOSCOPY/URETEROSCOPY/HOLMIUM LASER/STENT EXCHANGE;  Surgeon: Penne Knee, MD;  Location: [facility];  Service: Urology;  Laterality: Left;   DISTAL BICEPS TENDON REPAIR Right 01/19/2017   Procedure: BICEPS TENODESIS;  Surgeon: Edie Norleen PARAS, MD;  Location: [facility];  Service: Orthopedics;  Laterality: Right;  Diabetic - oral meds   ESOPHAGEAL DILATION  03/07/2023   Procedure: ESOPHAGEAL DILATION;  Surgeon: Jinny Carmine, MD;  Location: [facility];  Service: Endoscopy;;  12-15 mm   ESOPHAGEAL DILATION  04/04/2023   Procedure: ESOPHAGEAL DILATION;  Surgeon: Jinny Carmine, MD;  Location: [facility];  Service: Endoscopy;;  12-50mm   ESOPHAGOGASTRODUODENOSCOPY     2012, 2014, 2016, 2018   ESOPHAGOGASTRODUODENOSCOPY (EGD) WITH PROPOFOL  N/A 01/31/2015   Procedure: ESOPHAGOGASTRODUODENOSCOPY (EGD) WITH PROPOFOL ;  Surgeon: Lamar ONEIDA Holmes, MD;  Location: [facility];  Service: Endoscopy;  Laterality: N/A;   ESOPHAGOGASTRODUODENOSCOPY (EGD) WITH PROPOFOL  N/A 06/19/2020   Procedure: ESOPHAGOGASTRODUODENOSCOPY (EGD) WITH PROPOFOL ;  Surgeon: Jinny Carmine, MD;  Location: [facility];  Service: Endoscopy;  Laterality: N/A;   ESOPHAGOGASTRODUODENOSCOPY (EGD) WITH PROPOFOL  N/A 12/25/2020   Procedure: ESOPHAGOGASTRODUODENOSCOPY (EGD) WITH PROPOFOL ;  Surgeon: Jinny Carmine, MD;  Location: [facility];  Service: Endoscopy;  Laterality: N/A;   ESOPHAGOGASTRODUODENOSCOPY (EGD) WITH PROPOFOL  N/A 12/22/2021   Procedure: ESOPHAGOGASTRODUODENOSCOPY (EGD) WITH PROPOFOL ;  Surgeon: Jinny Carmine, MD;  Location: [facility];  Service: Endoscopy;  Laterality: N/A;   ESOPHAGOGASTRODUODENOSCOPY (EGD) WITH PROPOFOL  N/A 01/28/2022   Procedure: ESOPHAGOGASTRODUODENOSCOPY (EGD) WITH PROPOFOL ;   Surgeon: Jinny Carmine, MD;  Location: [facility];  Service: Endoscopy;  Laterality: N/A;   ESOPHAGOGASTRODUODENOSCOPY (EGD) WITH PROPOFOL  N/A 03/07/2023   Procedure: ESOPHAGOGASTRODUODENOSCOPY (EGD) WITH PROPOFOL ;  Surgeon: Jinny Carmine, MD;  Location: [facility];  Service: Endoscopy;  Laterality: N/A;   ESOPHAGOGASTRODUODENOSCOPY (EGD) WITH PROPOFOL  N/A 04/04/2023   Procedure: ESOPHAGOGASTRODUODENOSCOPY (EGD) WITH BIOPSY;  Surgeon: Jinny Carmine, MD;  Location: [facility];  Service: Endoscopy;  Laterality: N/A;   HERNIA REPAIR     IR NEPHROSTOMY PLACEMENT LEFT  05/22/2018   LITHOTRIPSY     NEPHROLITHOTOMY Left 05/22/2018   Procedure: NEPHROLITHOTOMY PERCUTANEOUS;  Surgeon: Penne Knee, MD;  Location: [facility];  Service: Urology;  Laterality: Left;   SAVORY DILATION N/A 01/31/2015   Procedure: SAVORY DILATION;  Surgeon: Lamar ONEIDA Holmes, MD;  Location: [facility] END[facility];  Service: Endoscopy;  Laterality: N/A;   SHOULDER ARTHROSCOPY Right 01/19/2017   Procedure: ARTHROSCOPY SHOULDER DEBRIDEMENT DECOMPRESSION AND  ROTATOR CUFF REPAIR AND BICEPS TENODESIS;  Surgeon: Edie Norleen PARAS, MD;  Location: [facility];  Service: Orthopedics;  Laterality: Right;   TOTAL HIP ARTHROPLASTY Right 03/18/2022   Procedure: Right posterior total hip arthroplasty;  Surgeon: Lorelle Hussar, MD;  Location: [facility];  Service: Orthopedics;  Laterality: Right;    History reviewed. No pertinent family history.  Social History   Tobacco Use   Smoking status: Former    Current packs/day: 0.00    Average packs/day: 1.5 packs/day for 25.0 years (37.5 ttl pk-yrs)  Types: Cigarettes    Start date: 68    Quit date: 2010    Years since quitting: 15.5   Smokeless tobacco: Never  Vaping Use   Vaping status: Never Used  Substance Use Topics   Alcohol  use: No   Drug use: No    No current facility-administered medications for this encounter.  Current Outpatient Medications:     alendronate (FOSAMAX) 70 MG tablet, Take 70 mg by mouth once a week., Disp: , Rfl:    [Paused] furosemide (LASIX) 20 MG tablet, Take 20 mg by mouth daily., Disp: , Rfl:    lisinopril -hydrochlorothiazide  (ZESTORETIC ) 20-25 MG tablet, Take 0.5 tablets by mouth daily., Disp: , Rfl:    acetaminophen  (TYLENOL ) 500 MG tablet, Take 2 tablets (1,000 mg total) by mouth every 8 (eight) hours., Disp: 30 tablet, Rfl: 0   buPROPion  (WELLBUTRIN  XL) 300 MG 24 hr tablet, Take 300 mg by mouth daily., Disp: , Rfl:    Cholecalciferol  (VITAMIN D3) 50 MCG (2000 UT) capsule, Take 2,000 Units by mouth daily., Disp: , Rfl:    DULoxetine  (CYMBALTA ) 60 MG capsule, Take 60 mg by mouth daily., Disp: , Rfl:    lisinopril  (ZESTRIL ) 10 MG tablet, Take 1 tablet by mouth daily., Disp: , Rfl:    magnesium  oxide (MAG-OX) 400 MG tablet, Take 400 mg by mouth., Disp: , Rfl:    meclizine  (ANTIVERT ) 25 MG tablet, Take 25 mg by mouth 3 (three) times daily as needed for dizziness., Disp: , Rfl:    metFORMIN (GLUCOPHAGE) 850 MG tablet, Take 850 mg by mouth 2 (two) times daily with a meal., Disp: , Rfl:    pantoprazole  (PROTONIX ) 40 MG tablet, Take 40 mg by mouth at bedtime., Disp: , Rfl:    pantoprazole  (PROTONIX ) 40 MG tablet, Take 1 tablet (40 mg total) by mouth daily., Disp: 30 tablet, Rfl: 11   potassium chloride  (KLOR-CON ) 8 MEQ tablet, 8 mEq daily., Disp: , Rfl:    propranolol  ER (INDERAL  LA) 60 MG 24 hr capsule, Take 60 mg by mouth daily., Disp: , Rfl:    rosuvastatin  (CRESTOR ) 5 MG tablet, Take 5 mg by mouth at bedtime. , Disp: , Rfl:    traZODone  (DESYREL ) 50 MG tablet, Take 50 mg by mouth at bedtime., Disp: , Rfl:    triamterene-hydrochlorothiazide  (MAXZIDE-25) 37.5-25 MG tablet, Take 1 tablet by mouth daily., Disp: , Rfl:    vitamin B-12 (CYANOCOBALAMIN ) 1000 MCG tablet, Take 1,000 mcg by mouth daily., Disp: , Rfl:   No Known Allergies   ROS  As noted in HPI.   Physical Exam  BP 98/68 (BP Location: Left Arm)   Pulse  75   Resp 16   SpO2 98%   Constitutional: Well developed, well nourished, no acute distress Eyes: PERRL, EOMI, conjunctiva normal bilaterally HENT: Normocephalic, atraumatic,mucus membranes moist Respiratory: Clear to auscultation bilaterally, no rales, no wheezing, no rhonchi Cardiovascular: Normal rate and rhythm, no murmurs, no gallops, no rubs.  No JVD.  Patient able to tolerate lying flat. GI: Soft, nondistended, nontender, no rebound, no guarding.  No hepatomegaly. skin: No rash, skin intact Musculoskeletal: 1+ bilateral lower extremity edema to knees with very mild erythema.  No increased temperature..  Skin intact.  No blisters, open sores.  Calf symmetric, nontender.  No palpable cord.  PT 2+ and equal bilaterally. Neurologic: Alert & oriented x 3, CN III-XII grossly intact, no motor deficits, sensation grossly intact Psychiatric: Speech and behavior appropriate   ED Course   Medications -  No data to display  Orders Placed This Encounter  Procedures   DG Chest 2 View    Standing Status:   Standing    Number of Occurrences:   1    Reason for Exam (SYMPTOM  OR DIAGNOSIS REQUIRED):   SOB, DOE,  LE edema several months- r/o pulm edema, effusion   Comprehensive metabolic panel    Standing Status:   Standing    Number of Occurrences:   1   CBC with Differential    Standing Status:   Standing    Number of Occurrences:   1   Urinalysis, dipstick only    Standing Status:   Standing    Number of Occurrences:   1   ED EKG    Standing Status:   Standing    Number of Occurrences:   1    Reason for Exam:   Shortness of breath   EKG 12-Lead    Standing Status:   Standing    Number of Occurrences:   1   Results for orders placed or performed during the hospital encounter of 10/31/23 (from the past 24 hours)  Comprehensive metabolic panel     Status: Abnormal   Collection Time: 10/31/23 10:42 AM  Result Value Ref Range   Sodium 139 135 - 145 mmol/L   Potassium 4.3 3.5 - 5.1  mmol/L   Chloride 103 98 - 111 mmol/L   CO2 25 22 - 32 mmol/L   Glucose, Bld 126 (H) 70 - 99 mg/dL   BUN 39 (H) 8 - 23 mg/dL   Creatinine, Ser 6.90 (H) 0.44 - 1.00 mg/dL   Calcium  9.3 8.9 - 10.3 mg/dL   Total Protein 7.4 6.5 - 8.1 g/dL   Albumin 3.9 3.5 - 5.0 g/dL   AST 17 15 - 41 U/L   ALT 15 0 - 44 U/L   Alkaline Phosphatase 48 38 - 126 U/L   Total Bilirubin 0.5 0.0 - 1.2 mg/dL   GFR, Estimated 16 (L) >60 mL/min   Anion gap 11 5 - 15  CBC with Differential     Status: Abnormal   Collection Time: 10/31/23 10:42 AM  Result Value Ref Range   WBC 10.6 (H) 4.0 - 10.5 K/uL   RBC 4.56 3.87 - 5.11 MIL/uL   Hemoglobin 14.0 12.0 - 15.0 g/dL   HCT 58.7 63.9 - 53.9 %   MCV 90.4 80.0 - 100.0 fL   MCH 30.7 26.0 - 34.0 pg   MCHC 34.0 30.0 - 36.0 g/dL   RDW 86.9 88.4 - 84.4 %   Platelets 285 150 - 400 K/uL   nRBC 0.0 0.0 - 0.2 %   Neutrophils Relative % 66 %   Neutro Abs 6.8 1.7 - 7.7 K/uL   Lymphocytes Relative 18 %   Lymphs Abs 1.9 0.7 - 4.0 K/uL   Monocytes Relative 8 %   Monocytes Absolute 0.8 0.1 - 1.0 K/uL   Eosinophils Relative 8 %   Eosinophils Absolute 0.9 (H) 0.0 - 0.5 K/uL   Basophils Relative 0 %   Basophils Absolute 0.0 0.0 - 0.1 K/uL   Immature Granulocytes 0 %   Abs Immature Granulocytes 0.03 0.00 - 0.07 K/uL  Urinalysis, dipstick only     Status: Abnormal   Collection Time: 10/31/23 10:42 AM  Result Value Ref Range   Color, Urine YELLOW YELLOW   APPearance HAZY (A) CLEAR   Specific Gravity, Urine >1.030 (H) 1.005 - 1.030   pH 5.5 5.0 -  8.0   Glucose, UA NEGATIVE NEGATIVE mg/dL   Hgb urine dipstick NEGATIVE NEGATIVE   Bilirubin Urine MODERATE (A) NEGATIVE   Ketones, ur TRACE (A) NEGATIVE mg/dL   Protein, ur 30 (A) NEGATIVE mg/dL   Nitrite NEGATIVE NEGATIVE   Leukocytes,Ua NEGATIVE NEGATIVE   DG Chest 2 View Result Date: 10/31/2023 CLINICAL DATA:  Shortness of breath. Bilateral lower extremity edema for several months. EXAM: CHEST - 2 VIEW COMPARISON:   05/24/2022 FINDINGS: Normal sized heart. Aortic arch calcifications. The lungs are hyperexpanded and clear with normal vascularity. 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 IMPRESSION: 1. No acute abnormality. 2. COPD. Electronically Signed   By: Elspeth Bathe M.D.   On: 10/31/2023 11:12    ED Clinical Impression  1. Acute kidney injury (HCC)   2. Shortness of breath   3. Bilateral lower extremity edema   4. Elevated serum creatinine      ED Assessment/Plan    Outside records extensively reviewed.  Additional medical history obtained.  As noted in HPI.  Patient presents with bilateral lower extremity edema for the past 7 or 8 months, which has not changed today.  She was reporting several months of worsening shortness of breath and dyspnea on exertion.  Vitals are normal, she is in no respiratory distress.  I believe that she is stable for limited workup here in the urgent care today.  Differential for lower extremity edema includes chronic congestive heart failure, nephrotic syndrome, chronic kidney disease, sodium retention, chronic liver failure/cirrhosis, chronic venous insufficiency, decreased venous tone, OSA, thyroid disease.  Doubt bilateral DVT.  Discussed with patient that I may not be able to find the cause of her symptoms today, but that she will need to follow-up with cardiology ASAP.  Will check a UA, EKG, CBC, CMP.  Will discontinue her meloxicam.  BUN 39/creatinine 3.09, most recent BUN 17/creatinine 1.4 as of 08/17/2023 per outside records.  Baseline creatinine since November 2024  appears to be around 1.3.   EKG: Normal sinus rhythm, rate 67.  Normal axis, normal intervals.  No hypertrophy.  No ST-T wave changes.  No change compared to EKG from 03/2022  Reviewed imaging independently.  COPD.  No pulmonary effusion, edema per radiology.  See radiology report for details.  Patient presents with acute kidney injury.  She was unable to give  us  much urine here.  She is not sure if she had a change in her urine output or not.  I suspect she may be over diuresed.  Discussed labs and imaging with her and advised her to go to the emergency department.  She states that she will go.  Spent 40 minutes in the care of this patient.  No orders of the defined types were placed in this encounter.     *This clinic note was created using Dragon dictation software. Therefore, there may be occasional mistakes despite careful proofreading. ?    Van Knee, MD 10/31/23 1150

## 2023-10-31 NOTE — ED Triage Notes (Addendum)
 Patient to ED via POV from UC for bilateral leg swelling. Pt reports legs are leaking fluid. Takes water  pill per patient. Labs and chest x-ray completed at [facility] today.

## 2023-10-31 NOTE — ED Triage Notes (Addendum)
 Pt presents with SOB for several months. Pt states her symptoms are worse with exertion. Pt is also concerned about fluid in bilateral legs.

## 2023-10-31 NOTE — Discharge Instructions (Addendum)
 You have acute kidney injury, which could be contributing to your swelling in your legs.  Your BUN and creatinine today are 39/3.09, your baseline creatinine is around 1.3.  Your urinalysis shows that it is very concentrated with moderate bilirubin and trace ketones, so protein.  There is no evidence of a UTI.  Chest x-ray was negative for pulmonary edema or effusion.  Please go immediately to the emergency department for further evaluation.  Your chest x-ray showed no evidence of congestive heart failure or fluid in your lungs.  It did show some COPD per the radiologist read.

## 2023-10-31 NOTE — H&P (Signed)
 History and Physical    RONNIESHA SEIBOLD FMW:[id number] DOB: 06/24/1950 DOA: 10/31/2023  Referring MD/NP/PA:   PCP: Steva Clotilda DEL, NP   Patient coming from:  The patient is coming from home.     Chief Complaint: Bilateral leg edema, abnormal lab with worsening renal function  HPI: Sheryl [name] is a 73 y.o. female with medical history significant of hypertension, hyperlipidemia, diabetes mellitus, D CHF, GERD, depression with anxiety, CKD-3A, obesity, hemochromatosis carrier, dysphagia, hiatal hernia, vertigo, kidney stone, who presents with bilateral leg edema and abnormal lab with worsening renal function.  Patient states that she has bilateral leg edema recently.  She was seen in urgent care today, and found to have worsening renal function.  She was sent to ED for further evaluation treatment.  Patient reports mild SOB, no cough, chest pain, fever or chills.  No nausea, vomiting, diarrhea or abdominal pain.  No symptoms to UTI.  On examination, patient has trace bilateral leg edema.  Data reviewed independently and ED Course: pt was found to have BNP of 37.2, UA negative for UTI (with protein 30), creatinine was 3.09 in urgent care and 2.76 in ED, BUN 39, GFR 18 (recent baseline creatinine 1.2 on 05/26/2022).  Temperature normal, blood pressure 116/55, heart rate 85, RR 18, oxygen saturation 97% on room air.  Chest x-ray negative for infiltration, but showed COPD.  Patient is placed in telemetry bed for observation.   EKG: I have personally reviewed.  Sinus rhythm, QTc 424, LAE, early R wave progression.   Review of Systems:   General: no fevers, chills, no body weight gain, fatigue HEENT: no blurry vision, hearing changes or sore throat Respiratory: has mild dyspnea, no coughing, wheezing CV: no chest pain, no palpitations GI: no nausea, vomiting, abdominal pain, diarrhea, constipation GU: no dysuria, burning on urination, increased urinary frequency, hematuria  Ext: has trace  leg edema Neuro: no unilateral weakness, numbness, or tingling, no vision change or hearing loss Skin: no rash, no skin tear. MSK: No muscle spasm, no deformity, no limitation of range of movement in spin Heme: No easy bruising.  Travel history: No recent long distant travel.   Allergy: No Known Allergies  Past Medical History:  Diagnosis Date   Anesthesia complication    bradycardia   Anxiety    Arthritis    lower back, left hip    Chicken pox    Chronic diastolic CHF (congestive heart failure) (HCC)    Chronic insomnia    Chronic kidney disease    Depression    Diabetes mellitus without complication (HCC)    Esophageal dysphagia    GERD (gastroesophageal reflux disease)    Hemochromatosis carrier    History of hiatal hernia    History of kidney stones    Hypercholesterolemia    Hypertension    Migraines    rare now   Nephrolithiasis    Osteopenia    PONV (postoperative nausea and vomiting)    VOMITED A LITTLE BIT AFTER KIDNEY STONE SURGERY   Sepsis (HCC) 05/24/2022   Vertigo    Vitamin D  deficiency     Past Surgical History:  Procedure Laterality Date   BREAST CYST ASPIRATION Left    negative 2012   CHOLECYSTECTOMY     COLONOSCOPY     2007, 2012   COLONOSCOPY WITH PROPOFOL  N/A 12/25/2020   Procedure: COLONOSCOPY WITH PROPOFOL ;  Surgeon: Jinny Carmine, MD;  Location: [facility];  Service: Endoscopy;  Laterality: N/A;   COLONOSCOPY, ESOPHAGOGASTRODUODENOSCOPY (EGD)  AND ESOPHAGEAL DILATION     CYSTOSCOPY W/ RETROGRADES Bilateral 12/04/2018   Procedure: CYSTOSCOPY WITH RETROGRADE PYELOGRAM;  Surgeon: Penne Knee, MD;  Location: [facility];  Service: Urology;  Laterality: Bilateral;   CYSTOSCOPY WITH STENT PLACEMENT Left 05/24/2022   Procedure: CYSTOSCOPY WITH STENT PLACEMENT;  Surgeon: Penne Knee, MD;  Location: [facility];  Service: Urology;  Laterality: Left;   CYSTOSCOPY/URETEROSCOPY/HOLMIUM LASER/STENT PLACEMENT Left 06/26/2018   Procedure:  CYSTOSCOPY/URETEROSCOPY/HOLMIUM LASER/STENT Exchange;  Surgeon: Penne Knee, MD;  Location: [facility];  Service: Urology;  Laterality: Left;   CYSTOSCOPY/URETEROSCOPY/HOLMIUM LASER/STENT PLACEMENT Right 12/04/2018   Procedure: CYSTOSCOPY/URETEROSCOPY/HOLMIUM LASER/STENT PLACEMENT;  Surgeon: Penne Knee, MD;  Location: [facility];  Service: Urology;  Laterality: Right;   CYSTOSCOPY/URETEROSCOPY/HOLMIUM LASER/STENT PLACEMENT Left 06/07/2022   Procedure: CYSTOSCOPY/URETEROSCOPY/HOLMIUM LASER/STENT EXCHANGE;  Surgeon: Penne Knee, MD;  Location: [facility];  Service: Urology;  Laterality: Left;   DISTAL BICEPS TENDON REPAIR Right 01/19/2017   Procedure: BICEPS TENODESIS;  Surgeon: Edie Norleen PARAS, MD;  Location: [facility];  Service: Orthopedics;  Laterality: Right;  Diabetic - oral meds   ESOPHAGEAL DILATION  03/07/2023   Procedure: ESOPHAGEAL DILATION;  Surgeon: Jinny Carmine, MD;  Location: [facility];  Service: Endoscopy;;  12-15 mm   ESOPHAGEAL DILATION  04/04/2023   Procedure: ESOPHAGEAL DILATION;  Surgeon: Jinny Carmine, MD;  Location: [facility];  Service: Endoscopy;;  12-3mm   ESOPHAGOGASTRODUODENOSCOPY     2012, 2014, 2016, 2018   ESOPHAGOGASTRODUODENOSCOPY (EGD) WITH PROPOFOL  N/A 01/31/2015   Procedure: ESOPHAGOGASTRODUODENOSCOPY (EGD) WITH PROPOFOL ;  Surgeon: Lamar ONEIDA Holmes, MD;  Location: [facility];  Service: Endoscopy;  Laterality: N/A;   ESOPHAGOGASTRODUODENOSCOPY (EGD) WITH PROPOFOL  N/A 06/19/2020   Procedure: ESOPHAGOGASTRODUODENOSCOPY (EGD) WITH PROPOFOL ;  Surgeon: Jinny Carmine, MD;  Location: [facility];  Service: Endoscopy;  Laterality: N/A;   ESOPHAGOGASTRODUODENOSCOPY (EGD) WITH PROPOFOL  N/A 12/25/2020   Procedure: ESOPHAGOGASTRODUODENOSCOPY (EGD) WITH PROPOFOL ;  Surgeon: Jinny Carmine, MD;  Location: [facility];  Service: Endoscopy;  Laterality: N/A;   ESOPHAGOGASTRODUODENOSCOPY (EGD) WITH PROPOFOL  N/A 12/22/2021   Procedure:  ESOPHAGOGASTRODUODENOSCOPY (EGD) WITH PROPOFOL ;  Surgeon: Jinny Carmine, MD;  Location: [facility];  Service: Endoscopy;  Laterality: N/A;   ESOPHAGOGASTRODUODENOSCOPY (EGD) WITH PROPOFOL  N/A 01/28/2022   Procedure: ESOPHAGOGASTRODUODENOSCOPY (EGD) WITH PROPOFOL ;  Surgeon: Jinny Carmine, MD;  Location: [facility];  Service: Endoscopy;  Laterality: N/A;   ESOPHAGOGASTRODUODENOSCOPY (EGD) WITH PROPOFOL  N/A 03/07/2023   Procedure: ESOPHAGOGASTRODUODENOSCOPY (EGD) WITH PROPOFOL ;  Surgeon: Jinny Carmine, MD;  Location: [facility];  Service: Endoscopy;  Laterality: N/A;   ESOPHAGOGASTRODUODENOSCOPY (EGD) WITH PROPOFOL  N/A 04/04/2023   Procedure: ESOPHAGOGASTRODUODENOSCOPY (EGD) WITH BIOPSY;  Surgeon: Jinny Carmine, MD;  Location: [facility];  Service: Endoscopy;  Laterality: N/A;   HERNIA REPAIR     IR NEPHROSTOMY PLACEMENT LEFT  05/22/2018   LITHOTRIPSY     NEPHROLITHOTOMY Left 05/22/2018   Procedure: NEPHROLITHOTOMY PERCUTANEOUS;  Surgeon: Penne Knee, MD;  Location: [facility];  Service: Urology;  Laterality: Left;   SAVORY DILATION N/A 01/31/2015   Procedure: SAVORY DILATION;  Surgeon: Lamar ONEIDA Holmes, MD;  Location: [facility] END[facility];  Service: Endoscopy;  Laterality: N/A;   SHOULDER ARTHROSCOPY Right 01/19/2017   Procedure: ARTHROSCOPY SHOULDER DEBRIDEMENT DECOMPRESSION AND  ROTATOR CUFF REPAIR AND BICEPS TENODESIS;  Surgeon: Edie Norleen PARAS, MD;  Location: [facility];  Service: Orthopedics;  Laterality: Right;   TOTAL HIP ARTHROPLASTY Right 03/18/2022   Procedure: Right posterior total hip arthroplasty;  Surgeon: Lorelle Hussar, MD;  Location: [facility];  Service: Orthopedics;  Laterality:  Right;    Social History:  reports that she quit smoking about 15 years ago. Her smoking use included cigarettes. She started smoking about 40 years ago. She has a 37.5 pack-year smoking history. She has never used smokeless tobacco. She reports that she does not drink alcohol  and  does not use drugs.  Family History: History reviewed. No pertinent family history.   Prior to Admission medications   Medication Sig Start Date End Date Taking? Authorizing Provider  acetaminophen  (TYLENOL ) 500 MG tablet Take 2 tablets (1,000 mg total) by mouth every 8 (eight) hours. 03/19/22   Charlene Debby BROCKS, PA-C  alendronate (FOSAMAX) 70 MG tablet Take 70 mg by mouth once a week. 09/23/23   [provider]  buPROPion  (WELLBUTRIN  XL) 300 MG 24 hr tablet Take 300 mg by mouth daily.    [provider]  Cholecalciferol  (VITAMIN D3) 50 MCG (2000 UT) capsule Take 2,000 Units by mouth daily.    [provider]  DULoxetine  (CYMBALTA ) 60 MG capsule Take 60 mg by mouth daily.    [provider]  furosemide (LASIX) 20 MG tablet Take 20 mg by mouth daily. 10/27/23   [provider]  lisinopril  (ZESTRIL ) 10 MG tablet Take 1 tablet by mouth daily. 04/14/23 04/13/24  [provider]  lisinopril -hydrochlorothiazide  (ZESTORETIC ) 20-25 MG tablet Take 0.5 tablets by mouth daily. 12/14/19 10/31/23  [provider]  magnesium  oxide (MAG-OX) 400 MG tablet Take 400 mg by mouth.    [provider]  meclizine  (ANTIVERT ) 25 MG tablet Take 25 mg by mouth 3 (three) times daily as needed for dizziness.    [provider]  metFORMIN (GLUCOPHAGE) 850 MG tablet Take 850 mg by mouth 2 (two) times daily with a meal.    [provider]  pantoprazole  (PROTONIX ) 40 MG tablet Take 40 mg by mouth at bedtime. 11/03/21   [provider]  pantoprazole  (PROTONIX ) 40 MG tablet Take 1 tablet (40 mg total) by mouth daily. 04/04/23 04/03/24  Jinny Carmine, MD  potassium chloride  (KLOR-CON ) 8 MEQ tablet 8 mEq daily. 01/01/20   [provider]  propranolol  ER (INDERAL  LA) 60 MG 24 hr capsule Take 60 mg by mouth daily. 02/08/22 02/28/23  [provider]  rosuvastatin  (CRESTOR ) 5 MG tablet Take 5 mg by mouth at bedtime.     [provider]  traZODone  (DESYREL ) 50 MG tablet Take 50 mg by mouth at bedtime. 01/02/22   [provider]  triamterene-hydrochlorothiazide  (MAXZIDE-25) 37.5-25 MG tablet Take 1 tablet by mouth daily. 04/14/23 04/13/24  [provider]  vitamin B-12 (CYANOCOBALAMIN ) 1000 MCG tablet Take 1,000 mcg by mouth daily.    [provider]    Physical Exam: Vitals:   10/31/23 1715 10/31/23 2136 10/31/23 2211  BP: (!) 116/55  120/62  Pulse: 85  78  Resp: 18  20  Temp: 98.2 F (36.8 C) 98.3 F (36.8 C) 97.9 F (36.6 C)  TempSrc: Oral Oral Oral  SpO2: 97%  100%  Weight: 77.1 kg    Height: 5' 2 (1.575 m)     General: Not in acute distress HEENT:       Eyes: PERRL, EOMI, no jaundice       ENT: No discharge from the ears and nose, no pharynx injection, no tonsillar enlargement.        Neck: No JVD, no bruit, no mass felt. Heme: No neck lymph node enlargement. Cardiac: S1/S2, RRR, No murmurs, No gallops or rubs. Respiratory:  No rales, wheezing, rhonchi or rubs. GI: Soft, nondistended, nontender, no rebound pain, no organomegaly, BS present. GU: No hematuria Ext: has trace leg edema bilaterally. 1+DP/PT pulse bilaterally. Musculoskeletal: No joint deformities, No joint redness or warmth, no limitation of ROM in spin. Skin: No rashes.  Neuro: Alert, oriented X3, cranial nerves II-XII grossly intact, moves all extremities normally. Psych: Patient is not psychotic, no suicidal or hemocidal ideation.  Labs on Admission: I have personally reviewed following labs and imaging studies  CBC: Recent Labs  Lab 10/31/23 1042 10/31/23 1717  WBC 10.6* 11.6*  NEUTROABS 6.8  --   HGB 14.0 14.2  HCT 41.2 41.8  MCV 90.4 91.3  PLT 285 283   Basic Metabolic Panel: Recent Labs  Lab 10/31/23 1042 10/31/23 1717  NA 139 140  K 4.3 4.2  CL 103 103  CO2 25 22  GLUCOSE 126* 100*  BUN 39* 39*  CREATININE 3.09* 2.76*  CALCIUM  9.3 9.5   GFR: Estimated Creatinine Clearance:  18 mL/min (A) (by C-G formula based on SCr of 2.76 mg/dL (H)). Liver Function Tests: Recent Labs  Lab 10/31/23 1042  AST 17  ALT 15  ALKPHOS 48  BILITOT 0.5  PROT 7.4  ALBUMIN 3.9   No results for input(s): LIPASE, AMYLASE in the last 168 hours. No results for input(s): AMMONIA in the last 168 hours. Coagulation Profile: No results for input(s): INR, PROTIME in the last 168 hours. Cardiac Enzymes: No results for input(s): CKTOTAL, CKMB, CKMBINDEX, TROPONINI in the last 168 hours. BNP (last 3 results) No results for input(s): PROBNP in the last 8760 hours. HbA1C: No results for input(s): HGBA1C in the last 72 hours. CBG: Recent Labs  Lab 10/31/23 2135 10/31/23 2213  GLUCAP 98 104*   Lipid Profile: No results for input(s): CHOL, HDL, LDLCALC, TRIG, CHOLHDL, LDLDIRECT in the last 72 hours. Thyroid Function Tests: No results for input(s): TSH, T4TOTAL, FREET4, T3FREE, THYROIDAB in the last 72 hours. Anemia Panel: No results for input(s): VITAMINB12, FOLATE, FERRITIN, TIBC, IRON, RETICCTPCT in the last 72 hours. Urine analysis:    Component Value Date/Time   COLORURINE YELLOW 10/31/2023 1042   APPEARANCEUR HAZY (A) 10/31/2023 1042   APPEARANCEUR Clear 08/05/2023 1052   LABSPEC >1.030 (H) 10/31/2023 1042   PHURINE 5.5 10/31/2023 1042   GLUCOSEU NEGATIVE 10/31/2023 1042   HGBUR NEGATIVE 10/31/2023 1042   BILIRUBINUR MODERATE (A) 10/31/2023 1042   BILIRUBINUR Negative 08/05/2023 1052   KETONESUR TRACE (A) 10/31/2023 1042   PROTEINUR 30 (A) 10/31/2023 1042   NITRITE NEGATIVE 10/31/2023 1042   LEUKOCYTESUR NEGATIVE 10/31/2023 1042   Sepsis Labs: @LABRCNTIP (procalcitonin:4,lacticidven:4) )No results found for this or any previous visit (from the past 240 hours).   Radiological Exams on Admission:   Assessment/Plan Principal Problem:   Acute kidney injury superimposed on stage 3a chronic kidney disease  (HCC) Active Problems:   Chronic diastolic CHF (congestive heart failure) (HCC)   Hypertension   Type II diabetes mellitus with renal manifestations (HCC)   Hypercholesterolemia   Bilateral leg edema   Leukocytosis   Depression with anxiety   Obesity (BMI 30.0-34.9)   Assessment and Plan:   Acute kidney injury superimposed on stage 3a chronic kidney disease (HCC): Likely due to dehydration and continuation of Lasix, Zestoretic , Maxzide and lisinopril .  - Place in telemetry bed for outpatient -Hold lisinopril , Lasix, Zestoretic , Maxide - IV fluid: 75 cc/h of normal saline for 8 hours -Follow-up renal ultrasound - Avoid renal toxic medications.  Chronic diastolic CHF (  congestive heart failure) (HCC): 2D echo 04/28/2023 showed EF> 55% with grade 1 diastolic dysfunction.  BNP normal 37.2.  Does not seem to have CHF exacerbation. - Hold Lasix due to worsening renal function  Hypertension: Blood pressure 116/55 -IV hydralazine  as needed --Hold lisinopril , Lasix, Zestoretic , Maxide as above  Type II diabetes mellitus with renal manifestations (HCC): Recent A1c 5.5, well-controlled.  Patient taking metformin -SSI  Hypercholesterolemia -Crestor   Bilateral leg edema - Follow-up lower extremity venous Doppler to rule out DVT  Leukocytosis: WBC 11.6, no signs of infection.  No fever.  Likely reactive -Follow-up CBC  Depression with anxiety -Continue home medications  Obesity (BMI 30.0-34.9): Patient has Obesity Class I, with body weight 77.1 Kg and BMI 31.09 kg/m2.  - Encourage losing weight - Exercise and healthy diet       DVT ppx: SQ Heparin   Code Status: Full code    Family Communication:     not done, no family member is at bed side.       Disposition Plan:  Anticipate discharge back to previous environment  Consults called:  none  Admission status and Level of care: Telemetry Medical:    for obs     Dispo: The patient is from: Home               Anticipated d/c is to: Home              Anticipated d/c date is: 1 day              Patient currently is medically stable to d/c.    Severity of Illness:  The appropriate patient status for this patient is OBSERVATION. Observation status is judged to be reasonable and necessary in order to provide the required intensity of service to ensure the patient's safety. The patient's presenting symptoms, physical exam findings, and initial radiographic and laboratory data in the context of their medical condition is felt to place them at decreased risk for further clinical deterioration. Furthermore, it is anticipated that the patient will be medically stable for discharge from the hospital within 2 midnights of admission.        Date of Service 11/01/2023    Caleb Exon [facility]   If 7PM-7AM, please contact night-coverage [email or web address] 11/01/2023, 1:24 AM

## 2023-11-01 ENCOUNTER — Observation Stay: Payer: Medicare (Managed Care)

## 2023-11-01 DIAGNOSIS — R600 Localized edema: Secondary | ICD-10-CM | POA: Diagnosis present

## 2023-11-01 DIAGNOSIS — N179 Acute kidney failure, unspecified: Secondary | ICD-10-CM | POA: Diagnosis not present

## 2023-11-01 DIAGNOSIS — N1831 Chronic kidney disease, stage 3a: Secondary | ICD-10-CM | POA: Diagnosis not present

## 2023-11-01 LAB — BASIC METABOLIC PANEL WITH GFR
Anion gap: 10 (ref 5–15)
Anion gap: 12 (ref 5–15)
BUN: 35 mg/dL — ABNORMAL HIGH (ref 8–23)
BUN: 28 mg/dL — ABNORMAL HIGH (ref 8–23)
CO2: 24 mmol/L (ref 22–32)
CO2: 21 mmol/L — ABNORMAL LOW (ref 22–32)
Calcium: 9.2 mg/dL (ref 8.9–10.3)
Calcium: 9.4 mg/dL (ref 8.9–10.3)
Chloride: 106 mmol/L (ref 98–111)
Chloride: 105 mmol/L (ref 98–111)
Creatinine, Ser: 2.1 mg/dL — ABNORMAL HIGH (ref 0.44–1.00)
Creatinine, Ser: 1.71 mg/dL — ABNORMAL HIGH (ref 0.44–1.00)
GFR, Estimated: 25 mL/min — ABNORMAL LOW (ref >60)
GFR, Estimated: 32 mL/min — ABNORMAL LOW (ref >60)
Glucose, Bld: 104 mg/dL — ABNORMAL HIGH (ref 70–99)
Glucose, Bld: 90 mg/dL (ref 70–99)
Potassium: 3.9 mmol/L (ref 3.5–5.1)
Potassium: 4.2 mmol/L (ref 3.5–5.1)
Sodium: 140 mmol/L (ref 135–145)
Sodium: 138 mmol/L (ref 135–145)

## 2023-11-01 LAB — CBC
HCT: 40 % (ref 36.0–46.0)
Hemoglobin: 13.6 g/dL (ref 12.0–15.0)
MCH: 31.3 pg (ref 26.0–34.0)
MCHC: 34 g/dL (ref 30.0–36.0)
MCV: 92 fL (ref 80.0–100.0)
Platelets: 229 K/uL (ref 150–400)
RBC: 4.35 MIL/uL (ref 3.87–5.11)
RDW: 12.4 % (ref 11.5–15.5)
WBC: 8.1 K/uL (ref 4.0–10.5)
nRBC: 0 % (ref 0.0–0.2)

## 2023-11-01 LAB — GLUCOSE, CAPILLARY
Glucose-Capillary: 125 mg/dL — ABNORMAL HIGH (ref 70–99)
Glucose-Capillary: 105 mg/dL — ABNORMAL HIGH (ref 70–99)

## 2023-11-01 MED ORDER — SODIUM CHLORIDE 0.9 % IV SOLN: INTRAVENOUS | Status: DC

## 2023-11-01 MED ORDER — SODIUM CHLORIDE 0.9 % IV SOLN: INTRAVENOUS | Status: AC

## 2023-11-01 NOTE — Plan of Care (Signed)
 IV removed, discharge instructions reviewed and patient waiting from ride in the discharge lounge

## 2023-11-01 NOTE — Care Management Obs Status (Signed)
 MEDICARE OBSERVATION STATUS NOTIFICATION   Patient Details  Name: Sheryl [name] MRN: [id number] Date of Birth: 05/21/1950   Medicare Observation Status Notification Given:  Yes    [name] 11/01/2023, 3:57 PM

## 2023-11-01 NOTE — Plan of Care (Signed)

## 2023-11-01 NOTE — Evaluation (Signed)
 Occupational Therapy Evaluation Patient Details Name: Sheryl [name] MRN: [id number] DOB: 03-05-1951 Today's Date: 11/01/2023   History of Present Illness   Sheryl [name] is a 73 y.o. female with medical history significant of hypertension, hyperlipidemia, diabetes mellitus, D CHF, GERD, depression with anxiety, CKD-3A, obesity, hemochromatosis carrier, dysphagia, hiatal hernia, vertigo, kidney stone, who presents with bilateral leg edema and abnormal lab with worsening renal function.     Clinical Impressions Pt was seen for OT evaluation this date. Prior to hospital admission, pt was indep in completing ADL/IADL tasks, amb with no DME. Pt lives alone in one level home. Pt currently participating in sink level grooming, ADL LB dressing sit/standing position and amb within room/BR with no DME/physical assistance. Initially providing supervision for safety with quickly progressed to independent. Pt educated on fall prevention techniques, safe ADL completion and energy conservation strategies. Do not anticipate/the need for follow up OT services upon acute hospital DC. Pt back to baseline functional independence. No skilled OT needs identified. Will sign off. Please re-consult if additional needs arise.              Functional Status Assessment   Patient has not had a recent decline in their functional status     Equipment Recommendations   None recommended by OT     Recommendations for Other Services         Precautions/Restrictions   Precautions Precautions: None Recall of Precautions/Restrictions: Intact Restrictions Weight Bearing Restrictions Per Provider Order: No     Mobility Bed Mobility Overal bed mobility: Independent             General bed mobility comments: Supine<>sit with no UE support    Transfers Overall transfer level: Independent Equipment used: None               General transfer comment: STS from multiple surfaces during session  with good eccentric control and safety awareness      Balance Overall balance assessment: Independent                                         ADL either performed or assessed with clinical judgement   ADL Overall ADL's : Independent                                       General ADL Comments: Completed sink level ADL tasks, toileting transfers independently     Vision Baseline Vision/History: 1 Wears glasses                         Pertinent Vitals/Pain Pain Assessment Pain Assessment: No/denies pain     Extremity/Trunk Assessment Upper Extremity Assessment Upper Extremity Assessment: Overall WFL for tasks assessed   Lower Extremity Assessment Lower Extremity Assessment: Overall WFL for tasks assessed;Defer to PT evaluation   Cervical / Trunk Assessment Cervical / Trunk Assessment: Normal   Communication Communication Communication: No apparent difficulties   Cognition Arousal: Alert Behavior During Therapy: WFL for tasks assessed/performed Cognition: No apparent impairments             OT - Cognition Comments: A/Ox4                 Following commands: Intact       Cueing  General  Comments   Cueing Techniques: Verbal cues  Pt eager to return home   Exercises Exercises: Other exercises Other Exercises Other Exercises: Edu: Role of OT eval, safe ADL completion, fall prevention techniques   Shoulder Instructions      Home Living Family/patient expects to be discharged to:: Private residence Living Arrangements: Alone   Type of Home: House Home Access: Level entry     Home Layout: One level     Bathroom Shower/Tub: Producer, television/film/video: Handicapped height Bathroom Accessibility: Yes How Accessible: Accessible via walker Home Equipment: Agricultural consultant (2 wheels);Cane - single point          Prior Functioning/Environment Prior Level of Function : Independent/Modified  Independent;Driving             Mobility Comments: No DME ADLs Comments: Indep in all ADL/IADLs      OT Goals(Current goals can be found in the care plan section)   Acute Rehab OT Goals Patient Stated Goal: Go home OT Goal Formulation: With patient Time For Goal Achievement: 11/15/23 Potential to Achieve Goals: Good   AM-PAC OT 6 Clicks Daily Activity     Outcome Measure Help from another person eating meals?: None Help from another person taking care of personal grooming?: None Help from another person toileting, which includes using toliet, bedpan, or urinal?: None Help from another person bathing (including washing, rinsing, drying)?: None Help from another person to put on and taking off regular upper body clothing?: None Help from another person to put on and taking off regular lower body clothing?: None 6 Click Score: 24   End of Session Equipment Utilized During Treatment: Gait belt Nurse Communication: Mobility status  Activity Tolerance: Patient tolerated treatment well Patient left: in chair;with call bell/phone within reach                   Time: 1356-1411 OT Time Calculation (min): 15 min Charges:  OT General Charges $OT Visit: 1 Visit OT Evaluation $OT Eval Low Complexity: 1 Low OT Treatments $Self Care/Home Management : 8-22 mins  Sheryl [name] M.S. OTR/L  11/01/23, 2:29 PM

## 2023-11-01 NOTE — Progress Notes (Addendum)
 PT Cancellation Note and Discharge  Patient Details Name: Sheryl [name] MRN: [id number] DOB: Sep 24, 1950   Cancelled Treatment:    Reason Eval/Treat Not Completed: PT screened, no needs identified, will sign off (Alerted by OT that patient is at baseline level of functional mobility with no apparent acute PT needs at this time.)  Sheryl [name], PT, MPT  Sheryl [name] 11/01/2023, 2:33 PM

## 2023-11-01 NOTE — Discharge Summary (Signed)
 Physician Discharge Summary  Sheryl [name] FMW:[id number] DOB: November 10, 1950 DOA: 10/31/2023  PCP: Steva Clotilda DEL, NP  Admit date: 10/31/2023 Discharge date: 11/01/2023  Admitted From: Home Disposition:  Home  Recommendations for Outpatient Follow-up:  Follow up with PCP in 1-2 weeks   Home Health:No  Equipment/Devices: None  Discharge Condition:Stable  CODE STATUS:FULL  Diet recommendation: Reg  Brief/Interim Summary:  73 y.o. female with medical history significant of hypertension, hyperlipidemia, diabetes mellitus, D CHF, GERD, depression with anxiety, CKD-3A, obesity, hemochromatosis carrier, dysphagia, hiatal hernia, vertigo, kidney stone, who presents with bilateral leg edema and abnormal lab with worsening renal function.    Patient states that she has bilateral leg edema recently.  She was seen in urgent care today, and found to have worsening renal function.  She was sent to ED for further evaluation treatment.  Patient reports mild SOB, no cough, chest pain, fever or chills.  No nausea, vomiting, diarrhea or abdominal pain.  No symptoms to UTI.  On examination, patient has trace bilateral leg edema.   Discharge Diagnoses:  Principal Problem:   Acute kidney injury superimposed on stage 3a chronic kidney disease (HCC) Active Problems:   Chronic diastolic CHF (congestive heart failure) (HCC)   Hypertension   Type II diabetes mellitus with renal manifestations (HCC)   Hypercholesterolemia   Bilateral leg edema   Leukocytosis   Depression with anxiety   Obesity (BMI 30.0-34.9)   Acute kidney injury superimposed on stage 3a chronic kidney disease (HCC): Suspect prerenal azotemia and possible ATN in the continuation of Lasix, Zestoretic , Maxide, lisinopril .  At time of discharge kidney function is approaching baseline.  Studies negative including renal ultrasound and bilateral lower extremity duplex.  Creatinine 1.7 at time of discharge with baseline approximately 1.2.  At  time of discharge will recommend discontinuation of lisinopril  and furosemide.  Discontinuation of Zestoretic .  Okay to continue Maxide as antihypertensive monotherapy.  Suggest return to primary care and repeat lab work within a week of discharge to determine further antihypertensive strategy.  Bilateral lower extremity edema Unclear etiology.  Suspect chronic venous insufficiency.  Presentation is inconsistent with cardiogenic pulmonary edema.  Likely limited role for loop diuretic and these appear to have affected her kidneys anyways.  Lower extremity duplex negative for DVT.  Suggest leg elevation, compression stockings   Discharge Instructions  Discharge Instructions     Diet - low sodium heart healthy   Complete by: As directed    Increase activity slowly   Complete by: As directed       Allergies as of 11/01/2023   No Known Allergies      Medication List     PAUSE taking these medications    furosemide 20 MG tablet Wait to take this until your doctor or other care provider tells you to start again. Commonly known as: LASIX Take 20 mg by mouth daily.   lisinopril  10 MG tablet Wait to take this until your doctor or other care provider tells you to start again. Commonly known as: ZESTRIL  Take 1 tablet by mouth daily.       STOP taking these medications    lisinopril -hydrochlorothiazide  20-25 MG tablet Commonly known as: ZESTORETIC    propranolol  ER 60 MG 24 hr capsule Commonly known as: INDERAL  LA       TAKE these medications    acetaminophen  500 MG tablet Commonly known as: TYLENOL  Take 2 tablets (1,000 mg total) by mouth every 8 (eight) hours.   alendronate 70 MG tablet Commonly  known as: FOSAMAX Take 70 mg by mouth once a week.   buPROPion  300 MG 24 hr tablet Commonly known as: WELLBUTRIN  XL Take 300 mg by mouth daily.   cyanocobalamin  1000 MCG tablet Commonly known as: VITAMIN B12 Take 1,000 mcg by mouth daily.   DULoxetine  60 MG  capsule Commonly known as: CYMBALTA  Take 60 mg by mouth daily.   magnesium  oxide 400 MG tablet Commonly known as: MAG-OX Take 400 mg by mouth.   meclizine  25 MG tablet Commonly known as: ANTIVERT  Take 25 mg by mouth 3 (three) times daily as needed for dizziness.   metFORMIN 850 MG tablet Commonly known as: GLUCOPHAGE Take 850 mg by mouth 2 (two) times daily with a meal.   pantoprazole  40 MG tablet Commonly known as: PROTONIX  Take 40 mg by mouth at bedtime. What changed: Another medication with the same name was removed. Continue taking this medication, and follow the directions you see here.   potassium chloride  8 MEQ tablet Commonly known as: KLOR-CON  8 mEq daily.   rosuvastatin  5 MG tablet Commonly known as: CRESTOR  Take 5 mg by mouth at bedtime.   traZODone  50 MG tablet Commonly known as: DESYREL  Take 50 mg by mouth at bedtime.   triamterene-hydrochlorothiazide  37.5-25 MG tablet Commonly known as: MAXZIDE-25 Take 1 tablet by mouth daily.   Vitamin D3 50 MCG (2000 UT) capsule Take 2,000 Units by mouth daily.        Follow-up Information     Steva Clotilda DEL, NP. Schedule an appointment as soon as possible for a visit in 1 week(s).   Specialty: Family Medicine Contact information: [address] [place] [address] [phone]                No Known Allergies  Consultations: None   Procedures/Studies: US  RENAL Result Date: 11/01/2023 CLINICAL DATA:  AKI. EXAM: RENAL / URINARY TRACT ULTRASOUND COMPLETE COMPARISON:  CT abdomen/pelvis dated 08/08/2023. FINDINGS: Right Kidney: Renal measurements: 9.4 x 4.3 x 3.9 cm = volume: 82.4 mL. Echogenicity within normal limits. No hydronephrosis. 4.5 x 4.2 x 4.3 cm simple cyst is again noted arising from the interpolar right kidney. Left Kidney: Renal measurements: 9.6 x 4.9 x 4.2 cm = volume: 103.6 mL. Echogenicity within normal limits. No mass or hydronephrosis visualized. Bladder: Appears normal for  degree of bladder distention. Other: None. IMPRESSION: No acute sonographic abnormality of the bilateral kidneys. No hydronephrosis. Electronically Signed   By: Harrietta Sherry M.D.   On: 11/01/2023 14:51   US  Venous Img Lower Bilateral (DVT) Result Date: 11/01/2023 CLINICAL DATA:  Bilateral lower extremity edema. EXAM: BILATERAL LOWER EXTREMITY VENOUS DOPPLER ULTRASOUND TECHNIQUE: Gray-scale sonography with graded compression, as well as color Doppler and duplex ultrasound were performed to evaluate the lower extremity deep venous systems from the level of the common femoral vein and including the common femoral, femoral, profunda femoral, popliteal and calf veins including the posterior tibial, peroneal and gastrocnemius veins when visible. The superficial great saphenous vein was also interrogated. Spectral Doppler was utilized to evaluate flow at rest and with distal augmentation maneuvers in the common femoral, femoral and popliteal veins. COMPARISON:  None Available. FINDINGS: RIGHT LOWER EXTREMITY Common Femoral Vein: No evidence of thrombus. Normal compressibility, respiratory phasicity and response to augmentation. Saphenofemoral Junction: No evidence of thrombus. Normal compressibility and flow on color Doppler imaging. Profunda Femoral Vein: No evidence of thrombus. Normal compressibility and flow on color Doppler imaging. Femoral Vein: No evidence of thrombus. Normal compressibility, respiratory  phasicity and response to augmentation. Popliteal Vein: No evidence of thrombus. Normal compressibility, respiratory phasicity and response to augmentation. Calf Veins: No evidence of thrombus. Normal compressibility and flow on color Doppler imaging. Superficial Great Saphenous Vein: No evidence of thrombus. Normal compressibility. Venous Reflux:  None. Other Findings: No evidence of superficial thrombophlebitis or abnormal fluid collection. LEFT LOWER EXTREMITY Common Femoral Vein: No evidence of thrombus.  Normal compressibility, respiratory phasicity and response to augmentation. Saphenofemoral Junction: No evidence of thrombus. Normal compressibility and flow on color Doppler imaging. Profunda Femoral Vein: No evidence of thrombus. Normal compressibility and flow on color Doppler imaging. Femoral Vein: No evidence of thrombus. Normal compressibility, respiratory phasicity and response to augmentation. Popliteal Vein: No evidence of thrombus. Normal compressibility, respiratory phasicity and response to augmentation. Calf Veins: No evidence of thrombus. Normal compressibility and flow on color Doppler imaging. Superficial Great Saphenous Vein: No evidence of thrombus. Normal compressibility. Venous Reflux:  None. Other Findings: No evidence of superficial thrombophlebitis or abnormal fluid collection. IMPRESSION: No evidence of deep venous thrombosis in either lower extremity. Electronically Signed   By: Marcey Moan M.D.   On: 11/01/2023 13:44   DG Chest 2 View Result Date: 10/31/2023 CLINICAL DATA:  Shortness of breath. Bilateral lower extremity edema for several months. EXAM: CHEST - 2 VIEW COMPARISON:  05/24/2022 FINDINGS: Normal sized heart. Aortic arch calcifications. The lungs are hyperexpanded and clear with normal vascularity. 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 IMPRESSION: 1. No acute abnormality. 2. COPD. Electronically Signed   By: Elspeth Bathe M.D.   On: 10/31/2023 11:12      Subjective: Seen and examined on the day of discharge.  Back to baseline.  Stable.  No distress.  Discharge Exam: Vitals:   11/01/23 0436 11/01/23 0727  BP: (!) 109/59 (!) 118/54  Pulse: 76 76  Resp: 16 16  Temp: 97.7 F (36.5 C) 97.8 F (36.6 C)  SpO2: 99% 100%   Vitals:   10/31/23 2211 11/01/23 0319 11/01/23 0436 11/01/23 0727  BP: 120/62  (!) 109/59 (!) 118/54  Pulse: 78  76 76  Resp: 20  16 16   Temp: 97.9 F (36.6 C)  97.7 F (36.5 C) 97.8 F (36.6 C)   TempSrc: Oral  Oral Oral  SpO2: 100%  99% 100%  Weight:  79.9 kg    Height:        General: Pt is alert, awake, not in acute distress Cardiovascular: RRR, S1/S2 +, no rubs, no gallops Respiratory: CTA bilaterally, no wheezing, no rhonchi Abdominal: Soft, NT, ND, bowel sounds + Extremities: no edema, no cyanosis    The results of significant diagnostics from this hospitalization (including imaging, microbiology, ancillary and laboratory) are listed below for reference.     Microbiology: No results found for this or any previous visit (from the past 240 hours).   Labs: BNP (last 3 results) Recent Labs    10/31/23 1717  BNP 37.2   Basic Metabolic Panel: Recent Labs  Lab 10/31/23 1042 10/31/23 1717 11/01/23 0444 11/01/23 1355  NA 139 140 140 138  K 4.3 4.2 3.9 4.2  CL 103 103 106 105  CO2 25 22 24  21*  GLUCOSE 126* 100* 104* 90  BUN 39* 39* 35* 28*  CREATININE 3.09* 2.76* 2.10* 1.71*  CALCIUM  9.3 9.5 9.2 9.4   Liver Function Tests: Recent Labs  Lab 10/31/23 1042  AST 17  ALT 15  ALKPHOS 48  BILITOT 0.5  PROT  7.4  ALBUMIN 3.9   No results for input(s): LIPASE, AMYLASE in the last 168 hours. No results for input(s): AMMONIA in the last 168 hours. CBC: Recent Labs  Lab 10/31/23 1042 10/31/23 1717 11/01/23 0444  WBC 10.6* 11.6* 8.1  NEUTROABS 6.8  --   --   HGB 14.0 14.2 13.6  HCT 41.2 41.8 40.0  MCV 90.4 91.3 92.0  PLT 285 283 229   Cardiac Enzymes: No results for input(s): CKTOTAL, CKMB, CKMBINDEX, TROPONINI in the last 168 hours. BNP: Invalid input(s): POCBNP CBG: Recent Labs  Lab 10/31/23 2135 10/31/23 2213 11/01/23 0727 11/01/23 1213  GLUCAP 98 104* 125* 105*   D-Dimer No results for input(s): DDIMER in the last 72 hours. Hgb A1c No results for input(s): HGBA1C in the last 72 hours. Lipid Profile No results for input(s): CHOL, HDL, LDLCALC, TRIG, CHOLHDL, LDLDIRECT in the last 72 hours. Thyroid  function studies No results for input(s): TSH, T4TOTAL, T3FREE, THYROIDAB in the last 72 hours.  Invalid input(s): FREET3 Anemia work up No results for input(s): VITAMINB12, FOLATE, FERRITIN, TIBC, IRON, RETICCTPCT in the last 72 hours. Urinalysis    Component Value Date/Time   COLORURINE YELLOW 10/31/2023 1042   APPEARANCEUR HAZY (A) 10/31/2023 1042   APPEARANCEUR Clear 08/05/2023 1052   LABSPEC >1.030 (H) 10/31/2023 1042   PHURINE 5.5 10/31/2023 1042   GLUCOSEU NEGATIVE 10/31/2023 1042   HGBUR NEGATIVE 10/31/2023 1042   BILIRUBINUR MODERATE (A) 10/31/2023 1042   BILIRUBINUR Negative 08/05/2023 1052   KETONESUR TRACE (A) 10/31/2023 1042   PROTEINUR 30 (A) 10/31/2023 1042   NITRITE NEGATIVE 10/31/2023 1042   LEUKOCYTESUR NEGATIVE 10/31/2023 1042   Sepsis Labs Recent Labs  Lab 10/31/23 1042 10/31/23 1717 11/01/23 0444  WBC 10.6* 11.6* 8.1   Microbiology No results found for this or any previous visit (from the past 240 hours).   Time coordinating discharge: 40 minutes  SIGNED:   Calvin KATHEE Robson, MD  [facility] 11/01/2023, 2:57 PM Pager   If 7PM-7AM, please contact night-coverage

## 2023-11-01 NOTE — Care Management Obs Status (Signed)
 MEDICARE OBSERVATION STATUS NOTIFICATION   Patient Details  Name: Sheryl [name] MRN: [id number] Date of Birth: May 11, 1950   Medicare Observation Status Notification Given:   patient not on the floor, went down for an ultra sound    [name] 11/01/2023, 10:57 AM

## 2023-11-01 NOTE — TOC CM/SW Note (Signed)
 Transition of Care [facility]) - Inpatient Brief Assessment   Patient Details  Name: [name] MRN: [id number] Date of Birth: [birth year]  Transition of Care [facility]) CM/SW Contact:    Asberry CHRISTELLA Jaksch, RN Phone Number: 11/01/2023, 3:56 PM   Clinical Narrative:  Transition of Care [facility]) Screening Note   Patient Details  Name: [name] Date of Birth: 04/15/1950   Transition of Care [facility]) CM/SW Contact:    Asberry CHRISTELLA Jaksch, RN Phone Number: 11/01/2023, 3:56 PM    Transition of Care Department [facility]) has reviewed patient and no TOC needs have been identified at this time. If new patient transition needs arise, please place a TOC consult.     Transition of Care Asessment: Insurance and Status: Insurance coverage has been reviewed Patient has primary care physician: Yes     Prior/Current Home Services: No current home services Social Drivers of Health Review: SDOH reviewed no interventions necessary Readmission risk has been reviewed: Yes Transition of care needs: no transition of care needs at this time

## 2023-11-01 NOTE — Progress Notes (Signed)
 OT Cancellation Note  Patient Details Name: Sheryl [name] MRN: [id number] DOB: 09/10/1950   Cancelled Treatment:    Reason Eval/Treat Not Completed: Patient at procedure or test/ unavailable. Pt off unit at ultrasound, will re attempt on next available date/time.  Larraine Colas M.S. OTR/L  11/01/23, 10:40 AM

## 2024-01-06 ENCOUNTER — Other Ambulatory Visit: Payer: Self-pay | Admitting: Physician Assistant

## 2024-01-06 DIAGNOSIS — N200 Calculus of kidney: Secondary | ICD-10-CM

## 2024-01-09 ENCOUNTER — Encounter: Payer: Self-pay | Admitting: Physician Assistant

## 2024-01-09 ENCOUNTER — Ambulatory Visit: Payer: Self-pay | Admitting: Physician Assistant

## 2024-01-09 ENCOUNTER — Ambulatory Visit
Admission: RE | Admit: 2024-01-09 | Discharge: 2024-01-09 | Disposition: A | Discharge status: Home / Self Care | Source: Ambulatory Visit | Attending: Physician Assistant | Admitting: Physician Assistant

## 2024-01-09 ENCOUNTER — Ambulatory Visit
Admission: RE | Admit: 2024-01-09 | Discharge: 2024-01-09 | Disposition: A | Discharge status: Home / Self Care | Attending: Physician Assistant | Admitting: Physician Assistant

## 2024-01-09 VITALS — BP 111/69 | HR 91 | Ht 62.0 in | Wt 157.0 lb

## 2024-01-09 DIAGNOSIS — N200 Calculus of kidney: Secondary | ICD-10-CM | POA: Diagnosis not present

## 2024-01-09 DIAGNOSIS — N3946 Mixed incontinence: Secondary | ICD-10-CM | POA: Diagnosis not present

## 2024-01-09 LAB — BLADDER SCAN AMB NON-IMAGING

## 2024-01-12 NOTE — Progress Notes (Signed)
 01/09/2024 12:57 PM   Sheryl [name] 1950/10/30 [id number]  CC: Chief Complaint  Patient presents with   Follow-up   HPI: Sheryl [name] is a 73 y.o. 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   Today she reports she has stopped the Gemtesa  and does not having any bothersome leakage.  She prefers to stay off of it.  She was admitted at [facility] in July with AKI on CKD 3A presumed due to polypharmacy.  Renal ultrasound showed no hydronephrosis.  She has stopped drinking Pepsi since.  KUB today with no radiopaque ureteral stones.  PVR 0 mL.  PMH: Past Medical History:  Diagnosis Date   Anesthesia complication    bradycardia   Anxiety    Arthritis    lower back, left hip    Chicken pox    Chronic diastolic CHF (congestive heart failure) (HCC)    Chronic insomnia    Chronic kidney disease    Depression    Diabetes mellitus without complication (HCC)    Esophageal dysphagia    GERD (gastroesophageal reflux disease)    Hemochromatosis carrier    History of hiatal hernia    History of kidney stones    Hypercholesterolemia    Hypertension    Migraines    rare now   Nephrolithiasis    Osteopenia    PONV (postoperative nausea and vomiting)    VOMITED A LITTLE BIT AFTER KIDNEY STONE SURGERY   Sepsis (HCC) 05/24/2022   Vertigo    Vitamin D  deficiency     Surgical History: Past Surgical History:  Procedure Laterality Date   BREAST CYST ASPIRATION Left    negative 2012   CHOLECYSTECTOMY     COLONOSCOPY     2007, 2012   COLONOSCOPY WITH PROPOFOL  N/A 12/25/2020   Procedure: COLONOSCOPY WITH PROPOFOL ;  Surgeon: Jinny Carmine, MD;  Location: [facility] END[facility]COPY;  Service: Endoscopy;  Laterality: N/A;   COLONOSCOPY, ESOPHAGOGASTRODUODENOSCOPY (EGD) AND ESOPHAGEAL DILATION     CYSTOSCOPY W/ RETROGRADES Bilateral 12/04/2018   Procedure: CYSTOSCOPY WITH RETROGRADE PYELOGRAM;  Surgeon:  Penne Knee, MD;  Location: [facility] ORS;  Service: Urology;  Laterality: Bilateral;   CYSTOSCOPY WITH STENT PLACEMENT Left 05/24/2022   Procedure: CYSTOSCOPY WITH STENT PLACEMENT;  Surgeon: Penne Knee, MD;  Location: [facility] ORS;  Service: Urology;  Laterality: Left;   CYSTOSCOPY/URETEROSCOPY/HOLMIUM LASER/STENT PLACEMENT Left 06/26/2018   Procedure: CYSTOSCOPY/URETEROSCOPY/HOLMIUM LASER/STENT Exchange;  Surgeon: Penne Knee, MD;  Location: [facility] ORS;  Service: Urology;  Laterality: Left;   CYSTOSCOPY/URETEROSCOPY/HOLMIUM LASER/STENT PLACEMENT Right 12/04/2018   Procedure: CYSTOSCOPY/URETEROSCOPY/HOLMIUM LASER/STENT PLACEMENT;  Surgeon: Penne Knee, MD;  Location: [facility] ORS;  Service: Urology;  Laterality: Right;   CYSTOSCOPY/URETEROSCOPY/HOLMIUM LASER/STENT PLACEMENT Left 06/07/2022   Procedure: CYSTOSCOPY/URETEROSCOPY/HOLMIUM LASER/STENT EXCHANGE;  Surgeon: Penne Knee, MD;  Location: [facility] ORS;  Service: Urology;  Laterality: Left;   DISTAL BICEPS TENDON REPAIR Right 01/19/2017   Procedure: BICEPS TENODESIS;  Surgeon: Edie Norleen PARAS, MD;  Location: [facility];  Service: Orthopedics;  Laterality: Right;  Diabetic - oral meds   ESOPHAGEAL DILATION  03/07/2023   Procedure: ESOPHAGEAL DILATION;  Surgeon: Jinny Carmine, MD;  Location: [facility];  Service: Endoscopy;;  12-15 mm   ESOPHAGEAL DILATION  04/04/2023   Procedure: ESOPHAGEAL DILATION;  Surgeon: Jinny Carmine, MD;  Location: [facility];  Service: Endoscopy;;  12-73mm   ESOPHAGOGASTRODUODENOSCOPY     2012, 2014, 2016, 2018   ESOPHAGOGASTRODUODENOSCOPY (EGD) WITH  PROPOFOL  N/A 01/31/2015   Procedure: ESOPHAGOGASTRODUODENOSCOPY (EGD) WITH PROPOFOL ;  Surgeon: Lamar ONEIDA Holmes, MD;  Location: [facility] END[facility];  Service: Endoscopy;  Laterality: N/A;   ESOPHAGOGASTRODUODENOSCOPY (EGD) WITH PROPOFOL  N/A 06/19/2020   Procedure: ESOPHAGOGASTRODUODENOSCOPY (EGD) WITH PROPOFOL ;  Surgeon: Jinny Carmine, MD;  Location: [facility]  ENDOSCOPY;  Service: Endoscopy;  Laterality: N/A;   ESOPHAGOGASTRODUODENOSCOPY (EGD) WITH PROPOFOL  N/A 12/25/2020   Procedure: ESOPHAGOGASTRODUODENOSCOPY (EGD) WITH PROPOFOL ;  Surgeon: Jinny Carmine, MD;  Location: [facility] END[facility];  Service: Endoscopy;  Laterality: N/A;   ESOPHAGOGASTRODUODENOSCOPY (EGD) WITH PROPOFOL  N/A 12/22/2021   Procedure: ESOPHAGOGASTRODUODENOSCOPY (EGD) WITH PROPOFOL ;  Surgeon: Jinny Carmine, MD;  Location: [facility];  Service: Endoscopy;  Laterality: N/A;   ESOPHAGOGASTRODUODENOSCOPY (EGD) WITH PROPOFOL  N/A 01/28/2022   Procedure: ESOPHAGOGASTRODUODENOSCOPY (EGD) WITH PROPOFOL ;  Surgeon: Jinny Carmine, MD;  Location: [facility] END[facility];  Service: Endoscopy;  Laterality: N/A;   ESOPHAGOGASTRODUODENOSCOPY (EGD) WITH PROPOFOL  N/A 03/07/2023   Procedure: ESOPHAGOGASTRODUODENOSCOPY (EGD) WITH PROPOFOL ;  Surgeon: Jinny Carmine, MD;  Location: [facility];  Service: Endoscopy;  Laterality: N/A;   ESOPHAGOGASTRODUODENOSCOPY (EGD) WITH PROPOFOL  N/A 04/04/2023   Procedure: ESOPHAGOGASTRODUODENOSCOPY (EGD) WITH BIOPSY;  Surgeon: Jinny Carmine, MD;  Location: [facility];  Service: Endoscopy;  Laterality: N/A;   HERNIA REPAIR     IR NEPHROSTOMY PLACEMENT LEFT  05/22/2018   LITHOTRIPSY     NEPHROLITHOTOMY Left 05/22/2018   Procedure: NEPHROLITHOTOMY PERCUTANEOUS;  Surgeon: Penne Knee, MD;  Location: [facility] ORS;  Service: Urology;  Laterality: Left;   SAVORY DILATION N/A 01/31/2015   Procedure: SAVORY DILATION;  Surgeon: Lamar ONEIDA Holmes, MD;  Location: [facility] ENDOSCOPY;  Service: Endoscopy;  Laterality: N/A;   SHOULDER ARTHROSCOPY Right 01/19/2017   Procedure: ARTHROSCOPY SHOULDER DEBRIDEMENT DECOMPRESSION AND  ROTATOR CUFF REPAIR AND BICEPS TENODESIS;  Surgeon: Edie Norleen PARAS, MD;  Location: [facility];  Service: Orthopedics;  Laterality: Right;   TOTAL HIP ARTHROPLASTY Right 03/18/2022   Procedure: Right posterior total hip arthroplasty;  Surgeon: Lorelle Hussar, MD;  Location: [facility] ORS;  Service: Orthopedics;  Laterality: Right;    Home Medications:  Allergies as of 01/09/2024   No Known Allergies      Medication List        Accurate as of January 09, 2024 11:59 PM. If you have any questions, ask your nurse or doctor.          PAUSE taking these medications    furosemide 20 MG tablet Wait to take this until your doctor or other care provider tells you to start again. Commonly known as: LASIX Take 20 mg by mouth daily.   lisinopril  10 MG tablet Wait to take this until your doctor or other care provider tells you to start again. Commonly known as: ZESTRIL  Take 1 tablet by mouth daily.       TAKE these medications    acetaminophen  500 MG tablet Commonly known as: TYLENOL  Take 2 tablets (1,000 mg total) by mouth every 8 (eight) hours.   alendronate 70 MG tablet Commonly known as: FOSAMAX Take 70 mg by mouth once a week.   buPROPion  300 MG 24 hr tablet Commonly known as: WELLBUTRIN  XL Take 300 mg by mouth daily.   cyanocobalamin  1000 MCG tablet Commonly known as: VITAMIN B12 Take 1,000 mcg by mouth daily.   DULoxetine  60 MG capsule Commonly known as: CYMBALTA  Take 60 mg by mouth daily.   magnesium  oxide 400 MG tablet Commonly known as: MAG-OX Take 400 mg by mouth.   meclizine  25 MG tablet Commonly known  as: ANTIVERT  Take 25 mg by mouth 3 (three) times daily as needed for dizziness.   metFORMIN 850 MG tablet Commonly known as: GLUCOPHAGE Take 850 mg by mouth 2 (two) times daily with a meal.   pantoprazole  40 MG tablet Commonly known as: PROTONIX  Take 40 mg by mouth at bedtime.   potassium chloride  8 MEQ tablet Commonly known as: KLOR-CON  8 mEq daily.   rosuvastatin  5 MG tablet Commonly known as: CRESTOR  Take 5 mg by mouth at bedtime.   traZODone  50 MG tablet Commonly known as: DESYREL  Take 50 mg by mouth at bedtime.   triamterene-hydrochlorothiazide  37.5-25 MG tablet Commonly known as:  MAXZIDE-25 Take 1 tablet by mouth daily.   Vitamin D3 50 MCG (2000 UT) capsule Take 2,000 Units by mouth daily.        Allergies:  No Known Allergies  Family History: No family history on file.  Social History:   reports that she quit smoking about 15 years ago. Her smoking use included cigarettes. She started smoking about 40 years ago. She has a 37.5 pack-year smoking history. She has never used smokeless tobacco. She reports that she does not drink alcohol  and does not use drugs.  Physical Exam: BP 111/69   Pulse 91   Ht 5' 2 (1.575 m)   Wt 157 lb (71.2 kg)   BMI 28.72 kg/m   Constitutional:  Alert and oriented, no acute distress, nontoxic appearing HEENT: [place], AT Cardiovascular: No clubbing, cyanosis, or edema Respiratory: Normal respiratory effort, no increased work of breathing Skin: No rashes, bruises or suspicious lesions Neurologic: Grossly intact, no focal deficits, moving all 4 extremities Psychiatric: Normal mood and affect  Laboratory Data: Results for orders placed or performed in visit on 01/09/24  BLADDER SCAN AMB NON-IMAGING   Collection Time: 01/09/24 10:11 AM  Result Value Ref Range   Scan Result 0ml    Assessment & Plan:   1. Nephrolithiasis (Primary) No significant findings on KUB today.  Will continue to monitor.  2. Mixed stress and urge urinary incontinence No bothersome symptoms off Gemtesa .  Will continue to monitor. - BLADDER SCAN AMB NON-IMAGING  Return in about 1 year (around 01/08/2025) for Annual follow-up with PVR, KUB prior.  Lucie Hones, PA-C  [facility] [address] [place], [place] [address] [phone]

## 2024-03-21 ENCOUNTER — Ambulatory Visit
Admission: EM | Admit: 2024-03-21 | Discharge: 2024-03-21 | Disposition: A | Discharge status: Home / Self Care | Attending: Emergency Medicine | Admitting: Emergency Medicine

## 2024-03-21 ENCOUNTER — Ambulatory Visit

## 2024-03-21 ENCOUNTER — Ambulatory Visit (HOSPITAL_COMMUNITY): Payer: Self-pay

## 2024-03-21 ENCOUNTER — Encounter: Payer: Self-pay | Admitting: Emergency Medicine

## 2024-03-21 DIAGNOSIS — M79672 Pain in left foot: Secondary | ICD-10-CM | POA: Diagnosis present

## 2024-03-21 LAB — URIC ACID: Uric Acid, Serum: 13.7 mg/dL — ABNORMAL HIGH (ref 2.5–7.1)

## 2024-03-21 MED ORDER — METHYLPREDNISOLONE 4 MG PO TBPK: ORAL_TABLET | ORAL | 0 refills | Status: AC

## 2024-03-21 NOTE — ED Provider Notes (Addendum)
 [facility]    CSN: [id number] Arrival date & time: 03/21/24  1134      History   Chief Complaint Chief Complaint  Patient presents with   Foot Pain    HPI Sheryl [name] is a 73 y.o. female.   HPI  73 year old female with past medical history significant for chronic insomnia, diabetes, high cholesterol, hypertension, kidney stones, mixed urge and stress incontinence, and stage IIIa chronic kidney disease presents for evaluation of pain and swelling to her left foot.  No known injury.  No history of previous injury or gout.  Past Medical History:  Diagnosis Date   Anesthesia complication    bradycardia   Anxiety    Arthritis    lower back, left hip    Chicken pox    Chronic diastolic CHF (congestive heart failure) (HCC)    Chronic insomnia    Chronic kidney disease    Depression    Diabetes mellitus without complication (HCC)    Esophageal dysphagia    GERD (gastroesophageal reflux disease)    Hemochromatosis carrier    History of hiatal hernia    History of kidney stones    Hypercholesterolemia    Hypertension    Migraines    rare now   Nephrolithiasis    Osteopenia    PONV (postoperative nausea and vomiting)    VOMITED A LITTLE BIT AFTER KIDNEY STONE SURGERY   Sepsis (HCC) 05/24/2022   Vertigo    Vitamin D  deficiency     Patient Active Problem List   Diagnosis Date Noted   Bilateral leg edema 11/01/2023   Acute kidney injury superimposed on stage 3a chronic kidney disease (HCC) 10/31/2023   Type II diabetes mellitus with renal manifestations (HCC) 10/31/2023   Chronic diastolic CHF (congestive heart failure) (HCC) 10/31/2023   Depression with anxiety 10/31/2023   Leukocytosis 10/31/2023   Sepsis (HCC) 05/24/2022   Hypokalemia 05/24/2022   Osteoarthritis of right hip 03/18/2022   Problems with swallowing and mastication    Diarrhea    Esophageal dysphagia    Gastritis without bleeding    Stricture and stenosis of esophagus    Breast  mass, left 07/24/2019   Obesity (BMI 30.0-34.9) 06/19/2019   Chronic left shoulder pain 06/12/2019   Osteopenia of multiple sites 06/12/2019   Palpitations 04/16/2019   Vitamin D  deficiency 12/14/2018   Hx of lithotripsy 12/13/2018   Kidney stone on left side 05/22/2018   Degenerative tear of glenoid labrum of right shoulder 01/21/2017   Biceps tendinitis of right upper extremity 01/21/2017   Urine test positive for microalbuminuria 08/31/2016   Chronic insomnia 08/20/2014   Anxiety 02/19/2014   Diabetes mellitus type 2, uncomplicated (HCC) 01/22/2014   Hypercholesterolemia 01/22/2014   Hypertension 01/22/2014   Back pain 07/05/2013   Foreign body in bladder and urethra 12/07/2012   Renal colic 11/15/2012   Disorder of calcium  metabolism 03/09/2012   Gross hematuria 03/09/2012   Hydronephrosis 03/09/2012   Kidney stone 03/09/2012   Mixed urge and stress incontinence 03/09/2012   Ureteric stone 03/09/2012    Past Surgical History:  Procedure Laterality Date   BREAST CYST ASPIRATION Left    negative 2012   CHOLECYSTECTOMY     COLONOSCOPY     2007, 2012   COLONOSCOPY WITH PROPOFOL  N/A 12/25/2020   Procedure: COLONOSCOPY WITH PROPOFOL ;  Surgeon: Jinny Carmine, MD;  Location: [facility];  Service: Endoscopy;  Laterality: N/A;   COLONOSCOPY, ESOPHAGOGASTRODUODENOSCOPY (EGD) AND ESOPHAGEAL DILATION  CYSTOSCOPY W/ RETROGRADES Bilateral 12/04/2018   Procedure: CYSTOSCOPY WITH RETROGRADE PYELOGRAM;  Surgeon: Penne Knee, MD;  Location: [facility];  Service: Urology;  Laterality: Bilateral;   CYSTOSCOPY WITH STENT PLACEMENT Left 05/24/2022   Procedure: CYSTOSCOPY WITH STENT PLACEMENT;  Surgeon: Penne Knee, MD;  Location: [facility];  Service: Urology;  Laterality: Left;   CYSTOSCOPY/URETEROSCOPY/HOLMIUM LASER/STENT PLACEMENT Left 06/26/2018   Procedure: CYSTOSCOPY/URETEROSCOPY/HOLMIUM LASER/STENT Exchange;  Surgeon: Penne Knee, MD;  Location: [facility];  Service:  Urology;  Laterality: Left;   CYSTOSCOPY/URETEROSCOPY/HOLMIUM LASER/STENT PLACEMENT Right 12/04/2018   Procedure: CYSTOSCOPY/URETEROSCOPY/HOLMIUM LASER/STENT PLACEMENT;  Surgeon: Penne Knee, MD;  Location: [facility];  Service: Urology;  Laterality: Right;   CYSTOSCOPY/URETEROSCOPY/HOLMIUM LASER/STENT PLACEMENT Left 06/07/2022   Procedure: CYSTOSCOPY/URETEROSCOPY/HOLMIUM LASER/STENT EXCHANGE;  Surgeon: Penne Knee, MD;  Location: [facility];  Service: Urology;  Laterality: Left;   DISTAL BICEPS TENDON REPAIR Right 01/19/2017   Procedure: BICEPS TENODESIS;  Surgeon: Edie Norleen PARAS, MD;  Location: [facility];  Service: Orthopedics;  Laterality: Right;  Diabetic - oral meds   ESOPHAGEAL DILATION  03/07/2023   Procedure: ESOPHAGEAL DILATION;  Surgeon: Jinny Carmine, MD;  Location: [facility];  Service: Endoscopy;;  12-15 mm   ESOPHAGEAL DILATION  04/04/2023   Procedure: ESOPHAGEAL DILATION;  Surgeon: Jinny Carmine, MD;  Location: [facility];  Service: Endoscopy;;  12-76mm   ESOPHAGOGASTRODUODENOSCOPY     2012, 2014, 2016, 2018   ESOPHAGOGASTRODUODENOSCOPY (EGD) WITH PROPOFOL  N/A 01/31/2015   Procedure: ESOPHAGOGASTRODUODENOSCOPY (EGD) WITH PROPOFOL ;  Surgeon: Lamar ONEIDA Holmes, MD;  Location: [facility];  Service: Endoscopy;  Laterality: N/A;   ESOPHAGOGASTRODUODENOSCOPY (EGD) WITH PROPOFOL  N/A 06/19/2020   Procedure: ESOPHAGOGASTRODUODENOSCOPY (EGD) WITH PROPOFOL ;  Surgeon: Jinny Carmine, MD;  Location: [facility];  Service: Endoscopy;  Laterality: N/A;   ESOPHAGOGASTRODUODENOSCOPY (EGD) WITH PROPOFOL  N/A 12/25/2020   Procedure: ESOPHAGOGASTRODUODENOSCOPY (EGD) WITH PROPOFOL ;  Surgeon: Jinny Carmine, MD;  Location: [facility];  Service: Endoscopy;  Laterality: N/A;   ESOPHAGOGASTRODUODENOSCOPY (EGD) WITH PROPOFOL  N/A 12/22/2021   Procedure: ESOPHAGOGASTRODUODENOSCOPY (EGD) WITH PROPOFOL ;  Surgeon: Jinny Carmine, MD;  Location: [facility];  Service: Endoscopy;   Laterality: N/A;   ESOPHAGOGASTRODUODENOSCOPY (EGD) WITH PROPOFOL  N/A 01/28/2022   Procedure: ESOPHAGOGASTRODUODENOSCOPY (EGD) WITH PROPOFOL ;  Surgeon: Jinny Carmine, MD;  Location: [facility];  Service: Endoscopy;  Laterality: N/A;   ESOPHAGOGASTRODUODENOSCOPY (EGD) WITH PROPOFOL  N/A 03/07/2023   Procedure: ESOPHAGOGASTRODUODENOSCOPY (EGD) WITH PROPOFOL ;  Surgeon: Jinny Carmine, MD;  Location: [facility];  Service: Endoscopy;  Laterality: N/A;   ESOPHAGOGASTRODUODENOSCOPY (EGD) WITH PROPOFOL  N/A 04/04/2023   Procedure: ESOPHAGOGASTRODUODENOSCOPY (EGD) WITH BIOPSY;  Surgeon: Jinny Carmine, MD;  Location: [facility];  Service: Endoscopy;  Laterality: N/A;   HERNIA REPAIR     IR NEPHROSTOMY PLACEMENT LEFT  05/22/2018   LITHOTRIPSY     NEPHROLITHOTOMY Left 05/22/2018   Procedure: NEPHROLITHOTOMY PERCUTANEOUS;  Surgeon: Penne Knee, MD;  Location: [facility];  Service: Urology;  Laterality: Left;   SAVORY DILATION N/A 01/31/2015   Procedure: SAVORY DILATION;  Surgeon: Lamar ONEIDA Holmes, MD;  Location: [facility];  Service: Endoscopy;  Laterality: N/A;   SHOULDER ARTHROSCOPY Right 01/19/2017   Procedure: ARTHROSCOPY SHOULDER DEBRIDEMENT DECOMPRESSION AND  ROTATOR CUFF REPAIR AND BICEPS TENODESIS;  Surgeon: Edie Norleen PARAS, MD;  Location: [facility];  Service: Orthopedics;  Laterality: Right;   TOTAL HIP ARTHROPLASTY Right 03/18/2022   Procedure: Right posterior total hip arthroplasty;  Surgeon: Lorelle Hussar, MD;  Location: [facility];  Service: Orthopedics;  Laterality: Right;    OB History  No obstetric history on file.      Home Medications    Prior to Admission medications  Medication Sig Start Date End Date Taking? Authorizing Provider  methylPREDNISolone  (MEDROL  DOSEPAK) 4 MG TBPK tablet Take according to the package insert. 03/21/24  Yes Bernardino Ditch, NP  acetaminophen  (TYLENOL ) 500 MG tablet Take 2 tablets (1,000 mg total) by mouth every 8 (eight)  hours. 03/19/22   Charlene Debby BROCKS, PA-C  alendronate (FOSAMAX) 70 MG tablet Take 70 mg by mouth once a week. 09/23/23   [provider]  buPROPion  (WELLBUTRIN  XL) 300 MG 24 hr tablet Take 300 mg by mouth daily.    [provider]  Cholecalciferol  (VITAMIN D3) 50 MCG (2000 UT) capsule Take 2,000 Units by mouth daily.    [provider]  DULoxetine  (CYMBALTA ) 60 MG capsule Take 60 mg by mouth daily.    [provider]  [Paused] furosemide (LASIX) 20 MG tablet Take 20 mg by mouth daily. Wait to take this until your doctor or other care provider tells you to start again. 10/27/23   [provider]  [Paused] lisinopril  (ZESTRIL ) 10 MG tablet Take 1 tablet by mouth daily. Wait to take this until your doctor or other care provider tells you to start again. 04/14/23 04/13/24  [provider]  magnesium  oxide (MAG-OX) 400 MG tablet Take 400 mg by mouth.    [provider]  meclizine  (ANTIVERT ) 25 MG tablet Take 25 mg by mouth 3 (three) times daily as needed for dizziness.    [provider]  metFORMIN (GLUCOPHAGE) 850 MG tablet Take 850 mg by mouth 2 (two) times daily with a meal.    [provider]  pantoprazole  (PROTONIX ) 40 MG tablet Take 40 mg by mouth at bedtime. 11/03/21   [provider]  potassium chloride  (KLOR-CON ) 8 MEQ tablet 8 mEq daily. 01/01/20   [provider]  rosuvastatin  (CRESTOR ) 5 MG tablet Take 5 mg by mouth at bedtime.     [provider]  traZODone  (DESYREL ) 50 MG tablet Take 50 mg by mouth at bedtime. 01/02/22   [provider]  triamterene-hydrochlorothiazide  (MAXZIDE-25) 37.5-25 MG tablet Take 1 tablet by mouth daily. 04/14/23 04/13/24  [provider]  vitamin B-12 (CYANOCOBALAMIN ) 1000 MCG tablet Take 1,000 mcg by mouth daily.    [provider]    Family History No family history on file.  Social History Social History[1]   Allergies   Patient has  no known allergies.   Review of Systems Review of Systems  Musculoskeletal:  Positive for arthralgias and joint swelling.  Skin:  Positive for color change.  Neurological:  Negative for weakness and numbness.     Physical Exam Triage Vital Signs ED Triage Vitals  Encounter Vitals Group     BP 03/21/24 1355 102/65     Girls Systolic BP Percentile --      Girls Diastolic BP Percentile --      Boys Systolic BP Percentile --      Boys Diastolic BP Percentile --      Pulse Rate 03/21/24 1355 (!) 105     Resp 03/21/24 1355 16     Temp 03/21/24 1355 98.1 F (36.7 C)     Temp Source 03/21/24 1355 Oral     SpO2 03/21/24 1355 95 %     Weight 03/21/24 1353 146 lb (66.2 kg)     Height --      Head Circumference --  Peak Flow --      Pain Score 03/21/24 1353 4     Pain Loc --      Pain Education --      Exclude from Growth Chart --    No data found.  Updated Vital Signs BP 102/65 (BP Location: Left Arm)   Pulse (!) 105   Temp 98.1 F (36.7 C) (Oral)   Resp 16   Wt 146 lb (66.2 kg)   SpO2 95%   BMI 26.70 kg/m   Visual Acuity Right Eye Distance:   Left Eye Distance:   Bilateral Distance:    Right Eye Near:   Left Eye Near:    Bilateral Near:     Physical Exam Vitals and nursing note reviewed.  Constitutional:      Appearance: Normal appearance. She is not ill-appearing.  HENT:     Head: Normocephalic and atraumatic.  Musculoskeletal:        General: Swelling and tenderness present. No signs of injury.  Skin:    General: Skin is warm and dry.     Capillary Refill: Capillary refill takes less than 2 seconds.     Findings: Erythema present.  Neurological:     General: No focal deficit present.     Mental Status: She is alert and oriented to person, place, and time.      UC Treatments / Results  Labs (all labs ordered are listed, but only abnormal results are displayed) Labs Reviewed  URIC ACID    EKG   Radiology No results  found.  Procedures Procedures (including critical care time)  Medications Ordered in UC Medications - No data to display  Initial Impression / Assessment and Plan / UC Course  I have reviewed the triage vital signs and the nursing notes.  Pertinent labs & imaging results that were available during my care of the patient were reviewed by me and considered in my medical decision making (see chart for details).   Patient is a pleasant, nontoxic-appearing 73 year old female presenting for evaluation of pain and swelling to her left foot.  She reports that when she woke up 2 days ago she was experiencing pain near the MTP joint of her right great toe and that the pain has since moved to the lateral aspect and is in the metatarsal of the fifth toe.  No numbness or tingling in her fingers.  She reports the pain is mostly when she bears weight.  There is tenderness with palpation of the fifth metatarsal but no crepitus.  The overlying skin is also erythematous and warm to touch.  Patient is also complaining of pain with palpation of the arch along the length of the fifth metatarsal.  The patient has a history of chronic kidney disease but does not have a previous history of gout.  She denies any trauma.  Differential diagnose include gout, tendinopathy, stress fracture, Morton's neuroma.  Her DP and PT pulses are 2+.  She has full range of motion sensation of her foot, toes, and ankle.  I will obtain a radiograph of the left foot to evaluate for any bony injury.  Left foot x-rays independently reviewed and evaluated by me.  Impression: No evidence of fracture or dislocation.  Soft tissues unremarkable.  Radiology overread is pending. Radiology impression states negative exam.  I will discharge the patient with a diagnosis of left foot pain.  This could possibly be tendinopathy or it may possibly be gout.  I we will have staff order  a uric acid prior to discharge to determine if this is gout.  I will start  the patient on a Medrol  Dosepak.  She can start that tomorrow morning.  She should keep her foot elevated is much as possible to help decrease swelling and aid in pain relief.  She may apply ice to her foot for 20 at a time, 2-3 times a day, to with pain and inflammation.  I will advised her to increase her oral water  intake so that she helps maintain hydration helps flush the uric acid from her system should this be gout.   Final Clinical Impressions(s) / UC Diagnoses   Final diagnoses:  Left foot pain     Discharge Instructions      Your x-rays do not show any evidence of broken bones.  As we discussed, this could possibly be gout or it may be an inflammation of the tendons in your foot.  Both of them can cause redness and localized pain.  We have drawn a uric acid level on you which should be back tomorrow.  If the uric acid level is elevated that would indicate that this is gout.  Beginning tomorrow morning start taking the Medrol  Dosepak according to the package instructions.  This help decrease inflammation and aid in pain relief.  Keep your left foot elevated is much as possible to aid in decreasing swelling and aiding in pain relief.  You may apply ice to your foot for 20 minutes at a time, 2-3 times a day, help with pain and inflammation.  Increase your oral water  intake so that you help improve hydration and flush uric acid from your system should this be gout.  If your symptoms are not improving, or new symptoms develop, please return for reevaluation or follow-up with your primary care provider.       ED Prescriptions     Medication Sig Dispense Auth. Provider   methylPREDNISolone  (MEDROL  DOSEPAK) 4 MG TBPK tablet Take according to the package insert. 1 each Bernardino Ditch, NP      PDMP not reviewed this encounter.    Bernardino Ditch, NP 03/21/24 1436     [1]  Social History Tobacco Use   Smoking status: Former    Current packs/day: 0.00    Average  packs/day: 1.5 packs/day for 25.0 years (37.5 ttl pk-yrs)    Types: Cigarettes    Start date: 38    Quit date: 2010    Years since quitting: 15.9   Smokeless tobacco: Never  Vaping Use   Vaping status: Never Used  Substance Use Topics   Alcohol  use: No   Drug use: No     Bernardino Ditch, NP 03/21/24 1519

## 2024-03-21 NOTE — Discharge Instructions (Addendum)
 Your x-rays do not show any evidence of broken bones.  As we discussed, this could possibly be gout or it may be an inflammation of the tendons in your foot.  Both of them can cause redness and localized pain.  We have drawn a uric acid level on you which should be back tomorrow.  If the uric acid level is elevated that would indicate that this is gout.  Beginning tomorrow morning start taking the Medrol  Dosepak according to the package instructions.  This help decrease inflammation and aid in pain relief.  Keep your left foot elevated is much as possible to aid in decreasing swelling and aiding in pain relief.  You may apply ice to your foot for 20 minutes at a time, 2-3 times a day, help with pain and inflammation.  Increase your oral water  intake so that you help improve hydration and flush uric acid from your system should this be gout.  If your symptoms are not improving, or new symptoms develop, please return for reevaluation or follow-up with your primary care provider.

## 2024-03-21 NOTE — ED Triage Notes (Signed)
 Pt presents with left foot pain and swelling x 2 days. Pt denies any injury.

## 2025-01-08 ENCOUNTER — Ambulatory Visit: Admitting: Physician Assistant
